# Patient Record
Sex: Female | Born: 1935
Health system: Southern US, Community
[De-identification: ages and names within clinical notes are randomized; demographics above are authoritative.]

## PROBLEM LIST (undated history)

## (undated) DIAGNOSIS — I4891 Unspecified atrial fibrillation: Secondary | ICD-10-CM

## (undated) DIAGNOSIS — M792 Neuralgia and neuritis, unspecified: Secondary | ICD-10-CM

## (undated) DIAGNOSIS — I509 Heart failure, unspecified: Secondary | ICD-10-CM

## (undated) DIAGNOSIS — J449 Chronic obstructive pulmonary disease, unspecified: Secondary | ICD-10-CM

## (undated) DIAGNOSIS — Z9289 Personal history of other medical treatment: Secondary | ICD-10-CM

## (undated) DIAGNOSIS — M1990 Unspecified osteoarthritis, unspecified site: Secondary | ICD-10-CM

## (undated) DIAGNOSIS — I050 Rheumatic mitral stenosis: Secondary | ICD-10-CM

## (undated) DIAGNOSIS — H9190 Unspecified hearing loss, unspecified ear: Secondary | ICD-10-CM

## (undated) DIAGNOSIS — Z9981 Dependence on supplemental oxygen: Secondary | ICD-10-CM

## (undated) DIAGNOSIS — Z8619 Personal history of other infectious and parasitic diseases: Secondary | ICD-10-CM

## (undated) DIAGNOSIS — J45909 Unspecified asthma, uncomplicated: Secondary | ICD-10-CM

## (undated) DIAGNOSIS — D649 Anemia, unspecified: Secondary | ICD-10-CM

## (undated) HISTORY — PX: TYMPANOSTOMY TUBE PLACEMENT: SHX32

## (undated) HISTORY — PX: KNEE ARTHROSCOPY: SHX127

## (undated) HISTORY — PX: CHOLECYSTECTOMY: SHX55

## (undated) HISTORY — PX: ABDOMINAL HYSTERECTOMY: SHX81

## (undated) HISTORY — PX: TUBAL LIGATION: SHX77

---

## 1998-02-09 ENCOUNTER — Other Ambulatory Visit: Admission: RE | Admit: 1998-02-09 | Discharge: 1998-02-09 | Payer: Self-pay | Admitting: Obstetrics and Gynecology

## 2003-10-19 LAB — PULMONARY FUNCTION TEST

## 2005-11-12 ENCOUNTER — Ambulatory Visit: Payer: Self-pay | Admitting: Cardiology

## 2008-09-14 ENCOUNTER — Ambulatory Visit: Payer: Self-pay | Admitting: Family Medicine

## 2008-09-14 DIAGNOSIS — B029 Zoster without complications: Secondary | ICD-10-CM | POA: Insufficient documentation

## 2008-09-14 DIAGNOSIS — J309 Allergic rhinitis, unspecified: Secondary | ICD-10-CM | POA: Insufficient documentation

## 2008-09-14 DIAGNOSIS — B0229 Other postherpetic nervous system involvement: Secondary | ICD-10-CM | POA: Insufficient documentation

## 2008-09-14 DIAGNOSIS — M129 Arthropathy, unspecified: Secondary | ICD-10-CM | POA: Insufficient documentation

## 2008-09-14 DIAGNOSIS — K219 Gastro-esophageal reflux disease without esophagitis: Secondary | ICD-10-CM | POA: Insufficient documentation

## 2008-10-09 ENCOUNTER — Telehealth (INDEPENDENT_AMBULATORY_CARE_PROVIDER_SITE_OTHER): Payer: Self-pay | Admitting: *Deleted

## 2008-10-23 ENCOUNTER — Telehealth (INDEPENDENT_AMBULATORY_CARE_PROVIDER_SITE_OTHER): Payer: Self-pay | Admitting: Family Medicine

## 2008-10-24 ENCOUNTER — Ambulatory Visit: Payer: Self-pay | Admitting: Family Medicine

## 2008-11-07 ENCOUNTER — Ambulatory Visit: Payer: Self-pay | Admitting: Family Medicine

## 2008-11-08 ENCOUNTER — Encounter (INDEPENDENT_AMBULATORY_CARE_PROVIDER_SITE_OTHER): Payer: Self-pay | Admitting: Family Medicine

## 2008-11-16 ENCOUNTER — Encounter (INDEPENDENT_AMBULATORY_CARE_PROVIDER_SITE_OTHER): Payer: Self-pay | Admitting: Family Medicine

## 2008-12-05 ENCOUNTER — Ambulatory Visit: Payer: Self-pay | Admitting: Family Medicine

## 2008-12-05 DIAGNOSIS — E663 Overweight: Secondary | ICD-10-CM | POA: Insufficient documentation

## 2009-03-06 ENCOUNTER — Ambulatory Visit: Payer: Self-pay | Admitting: Family Medicine

## 2009-03-06 DIAGNOSIS — G4700 Insomnia, unspecified: Secondary | ICD-10-CM | POA: Insufficient documentation

## 2009-04-03 ENCOUNTER — Encounter (INDEPENDENT_AMBULATORY_CARE_PROVIDER_SITE_OTHER): Payer: Self-pay | Admitting: Family Medicine

## 2009-04-12 ENCOUNTER — Encounter (INDEPENDENT_AMBULATORY_CARE_PROVIDER_SITE_OTHER): Payer: Self-pay | Admitting: Family Medicine

## 2011-03-21 LAB — PULMONARY FUNCTION TEST

## 2014-10-09 ENCOUNTER — Encounter (HOSPITAL_BASED_OUTPATIENT_CLINIC_OR_DEPARTMENT_OTHER): Payer: Self-pay | Admitting: *Deleted

## 2014-10-10 ENCOUNTER — Encounter (HOSPITAL_BASED_OUTPATIENT_CLINIC_OR_DEPARTMENT_OTHER): Payer: Self-pay | Admitting: *Deleted

## 2014-10-10 NOTE — Progress Notes (Signed)
Pt very hoh-says she has scar tissue ears, tubes, no hearing aids No htn -states asthma but no problems States she used to be on spiriva, but not in several yr

## 2014-10-17 ENCOUNTER — Ambulatory Visit (HOSPITAL_BASED_OUTPATIENT_CLINIC_OR_DEPARTMENT_OTHER): Payer: Medicare Other | Admitting: Certified Registered"

## 2014-10-17 ENCOUNTER — Encounter (HOSPITAL_BASED_OUTPATIENT_CLINIC_OR_DEPARTMENT_OTHER)
Admission: RE | Disposition: A | Discharge status: Home / Self Care | Payer: Self-pay | Source: Ambulatory Visit | Attending: Orthopedic Surgery

## 2014-10-17 ENCOUNTER — Encounter (HOSPITAL_BASED_OUTPATIENT_CLINIC_OR_DEPARTMENT_OTHER): Payer: Self-pay | Admitting: *Deleted

## 2014-10-17 ENCOUNTER — Ambulatory Visit (HOSPITAL_BASED_OUTPATIENT_CLINIC_OR_DEPARTMENT_OTHER)
Admission: RE | Admit: 2014-10-17 | Discharge: 2014-10-17 | Disposition: A | Discharge status: Home / Self Care | Payer: Medicare Other | Source: Ambulatory Visit | Attending: Orthopedic Surgery | Admitting: Orthopedic Surgery

## 2014-10-17 DIAGNOSIS — M720 Palmar fascial fibromatosis [Dupuytren]: Secondary | ICD-10-CM | POA: Diagnosis present

## 2014-10-17 DIAGNOSIS — K219 Gastro-esophageal reflux disease without esophagitis: Secondary | ICD-10-CM | POA: Insufficient documentation

## 2014-10-17 DIAGNOSIS — J45909 Unspecified asthma, uncomplicated: Secondary | ICD-10-CM | POA: Insufficient documentation

## 2014-10-17 DIAGNOSIS — Z79899 Other long term (current) drug therapy: Secondary | ICD-10-CM | POA: Insufficient documentation

## 2014-10-17 DIAGNOSIS — M1990 Unspecified osteoarthritis, unspecified site: Secondary | ICD-10-CM | POA: Diagnosis not present

## 2014-10-17 DIAGNOSIS — J449 Chronic obstructive pulmonary disease, unspecified: Secondary | ICD-10-CM | POA: Diagnosis not present

## 2014-10-17 DIAGNOSIS — Z87891 Personal history of nicotine dependence: Secondary | ICD-10-CM | POA: Insufficient documentation

## 2014-10-17 HISTORY — DX: Chronic obstructive pulmonary disease, unspecified: J44.9

## 2014-10-17 HISTORY — PX: FASCIECTOMY: SHX6525

## 2014-10-17 HISTORY — DX: Unspecified asthma, uncomplicated: J45.909

## 2014-10-17 HISTORY — DX: Unspecified osteoarthritis, unspecified site: M19.90

## 2014-10-17 HISTORY — DX: Neuralgia and neuritis, unspecified: M79.2

## 2014-10-17 HISTORY — DX: Personal history of other infectious and parasitic diseases: Z86.19

## 2014-10-17 HISTORY — DX: Unspecified hearing loss, unspecified ear: H91.90

## 2014-10-17 LAB — POCT HEMOGLOBIN-HEMACUE: Hemoglobin: 11 g/dL — ABNORMAL LOW (ref 12.0–15.0)

## 2014-10-17 SURGERY — FASCIECTOMY, PALM
Anesthesia: Regional | Site: Finger | Laterality: Right

## 2014-10-17 MED ORDER — PROPOFOL 10 MG/ML IV BOLUS
INTRAVENOUS | Status: DC | PRN
  Administered 2014-10-17: 100 mg via INTRAVENOUS

## 2014-10-17 MED ORDER — ONDANSETRON HCL 4 MG/2ML IJ SOLN
INTRAMUSCULAR | Status: DC | PRN
  Administered 2014-10-17: 4 mg via INTRAVENOUS

## 2014-10-17 MED ORDER — OXYCODONE HCL 5 MG PO TABS
5.0000 mg | ORAL_TABLET | Freq: Once | ORAL | Status: AC
  Administered 2014-10-17: 5 mg via ORAL

## 2014-10-17 MED ORDER — HYDROCODONE-ACETAMINOPHEN 5-325 MG PO TABS: 1.0000 | ORAL_TABLET | Freq: Four times a day (QID) | ORAL | Status: DC | PRN

## 2014-10-17 MED ORDER — BUPIVACAINE HCL (PF) 0.25 % IJ SOLN
INTRAMUSCULAR | Status: AC
  Filled 2014-10-17: qty 90

## 2014-10-17 MED ORDER — CEFAZOLIN SODIUM-DEXTROSE 2-3 GM-% IV SOLR
INTRAVENOUS | Status: AC
  Filled 2014-10-17: qty 50

## 2014-10-17 MED ORDER — FENTANYL CITRATE 0.05 MG/ML IJ SOLN
INTRAMUSCULAR | Status: DC | PRN
  Administered 2014-10-17: 25 ug via INTRAVENOUS
  Administered 2014-10-17: 50 ug via INTRAVENOUS
  Administered 2014-10-17 (×4): 25 ug via INTRAVENOUS

## 2014-10-17 MED ORDER — FENTANYL CITRATE 0.05 MG/ML IJ SOLN
INTRAMUSCULAR | Status: AC
  Filled 2014-10-17: qty 2

## 2014-10-17 MED ORDER — FENTANYL CITRATE 0.05 MG/ML IJ SOLN
25.0000 ug | INTRAMUSCULAR | Status: DC | PRN
  Administered 2014-10-17 (×2): 25 ug via INTRAVENOUS

## 2014-10-17 MED ORDER — LACTATED RINGERS IV SOLN
INTRAVENOUS | Status: DC
  Administered 2014-10-17 (×2): via INTRAVENOUS

## 2014-10-17 MED ORDER — MIDAZOLAM HCL 2 MG/2ML IJ SOLN: 1.0000 mg | INTRAMUSCULAR | Status: DC | PRN

## 2014-10-17 MED ORDER — LIDOCAINE HCL (CARDIAC) 20 MG/ML IV SOLN
INTRAVENOUS | Status: DC | PRN
  Administered 2014-10-17: 60 mg via INTRAVENOUS

## 2014-10-17 MED ORDER — VANCOMYCIN HCL IN DEXTROSE 1-5 GM/200ML-% IV SOLN
INTRAVENOUS | Status: AC
  Filled 2014-10-17: qty 200

## 2014-10-17 MED ORDER — BUPIVACAINE HCL (PF) 0.25 % IJ SOLN
INTRAMUSCULAR | Status: DC | PRN
  Administered 2014-10-17: 7 mL

## 2014-10-17 MED ORDER — OXYCODONE HCL 5 MG PO TABS
ORAL_TABLET | ORAL | Status: AC
  Filled 2014-10-17: qty 1

## 2014-10-17 MED ORDER — FENTANYL CITRATE 0.05 MG/ML IJ SOLN
INTRAMUSCULAR | Status: AC
  Filled 2014-10-17: qty 6

## 2014-10-17 MED ORDER — THROMBIN 20000 UNITS EX KIT
PACK | CUTANEOUS | Status: DC | PRN
  Administered 2014-10-17: 5000 [IU] via TOPICAL

## 2014-10-17 MED ORDER — VANCOMYCIN HCL IN DEXTROSE 1-5 GM/200ML-% IV SOLN
1000.0000 mg | INTRAVENOUS | Status: AC
  Administered 2014-10-17 (×2): 1000 mg via INTRAVENOUS

## 2014-10-17 MED ORDER — PROPOFOL 10 MG/ML IV EMUL
INTRAVENOUS | Status: AC
  Filled 2014-10-17: qty 50

## 2014-10-17 MED ORDER — DEXAMETHASONE SODIUM PHOSPHATE 10 MG/ML IJ SOLN
INTRAMUSCULAR | Status: DC | PRN
  Administered 2014-10-17: 8 mg via INTRAVENOUS

## 2014-10-17 MED ORDER — THROMBIN 5000 UNITS EX SOLR
CUTANEOUS | Status: AC
  Filled 2014-10-17: qty 5000

## 2014-10-17 MED ORDER — FENTANYL CITRATE 0.05 MG/ML IJ SOLN: 50.0000 ug | INTRAMUSCULAR | Status: DC | PRN

## 2014-10-17 MED ORDER — CHLORHEXIDINE GLUCONATE 4 % EX LIQD
60.0000 mL | Freq: Once | CUTANEOUS | Status: DC
Start: 2014-10-17 — End: 2014-10-17

## 2014-10-17 SURGICAL SUPPLY — 50 items
BLADE MINI RND TIP GREEN BEAV (BLADE) ×2 IMPLANT
BLADE SURG 15 STRL LF DISP TIS (BLADE) ×1 IMPLANT
BLADE SURG 15 STRL SS (BLADE) ×2
BNDG CMPR 9X4 STRL LF SNTH (GAUZE/BANDAGES/DRESSINGS) ×1
BNDG COHESIVE 3X5 TAN STRL LF (GAUZE/BANDAGES/DRESSINGS) ×2 IMPLANT
BNDG ESMARK 4X9 LF (GAUZE/BANDAGES/DRESSINGS) ×2 IMPLANT
BNDG GAUZE ELAST 4 BULKY (GAUZE/BANDAGES/DRESSINGS) ×2 IMPLANT
CHLORAPREP W/TINT 26ML (MISCELLANEOUS) ×2 IMPLANT
CORDS BIPOLAR (ELECTRODE) ×2 IMPLANT
COVER BACK TABLE 60X90IN (DRAPES) ×2 IMPLANT
COVER MAYO STAND STRL (DRAPES) ×2 IMPLANT
CUFF TOURNIQUET SINGLE 18IN (TOURNIQUET CUFF) ×2 IMPLANT
DECANTER SPIKE VIAL GLASS SM (MISCELLANEOUS) ×2 IMPLANT
DRAPE EXTREMITY T 121X128X90 (DRAPE) ×2 IMPLANT
DRAPE SURG 17X23 STRL (DRAPES) ×2 IMPLANT
DRSG KUZMA FLUFF (GAUZE/BANDAGES/DRESSINGS) IMPLANT
GAUZE SPONGE 4X4 12PLY STRL (GAUZE/BANDAGES/DRESSINGS) ×2 IMPLANT
GAUZE XEROFORM 1X8 LF (GAUZE/BANDAGES/DRESSINGS) ×2 IMPLANT
GLOVE BIO SURGEON STRL SZ7.5 (GLOVE) ×2 IMPLANT
GLOVE BIOGEL PI IND STRL 7.0 (GLOVE) ×1 IMPLANT
GLOVE BIOGEL PI IND STRL 8 (GLOVE) ×1 IMPLANT
GLOVE BIOGEL PI IND STRL 8.5 (GLOVE) ×1 IMPLANT
GLOVE BIOGEL PI INDICATOR 7.0 (GLOVE) ×1
GLOVE BIOGEL PI INDICATOR 8 (GLOVE) ×1
GLOVE BIOGEL PI INDICATOR 8.5 (GLOVE) ×1
GLOVE ECLIPSE 6.5 STRL STRAW (GLOVE) ×2 IMPLANT
GLOVE SURG ORTHO 8.0 STRL STRW (GLOVE) ×2 IMPLANT
GOWN STRL REUS W/ TWL LRG LVL3 (GOWN DISPOSABLE) ×1 IMPLANT
GOWN STRL REUS W/TWL LRG LVL3 (GOWN DISPOSABLE) ×2
GOWN STRL REUS W/TWL XL LVL3 (GOWN DISPOSABLE) ×4 IMPLANT
LOOP VESSEL MAXI BLUE (MISCELLANEOUS) ×2 IMPLANT
NS IRRIG 1000ML POUR BTL (IV SOLUTION) ×2 IMPLANT
PACK BASIN DAY SURGERY FS (CUSTOM PROCEDURE TRAY) ×2 IMPLANT
PAD CAST 3X4 CTTN HI CHSV (CAST SUPPLIES) ×1 IMPLANT
PADDING CAST ABS 3INX4YD NS (CAST SUPPLIES)
PADDING CAST ABS 4INX4YD NS (CAST SUPPLIES)
PADDING CAST ABS COTTON 3X4 (CAST SUPPLIES) IMPLANT
PADDING CAST ABS COTTON 4X4 ST (CAST SUPPLIES) IMPLANT
PADDING CAST COTTON 3X4 STRL (CAST SUPPLIES) ×2
SLEEVE SCD COMPRESS KNEE MED (MISCELLANEOUS) ×2 IMPLANT
SPLINT PLASTER CAST XFAST 3X15 (CAST SUPPLIES) ×10 IMPLANT
SPLINT PLASTER XTRA FASTSET 3X (CAST SUPPLIES) ×10
STOCKINETTE 4X48 STRL (DRAPES) ×2 IMPLANT
SUT ETHILON 4 0 PS 2 18 (SUTURE) ×4 IMPLANT
SUT SILK 2 0 FS (SUTURE) ×2 IMPLANT
SYR BULB 3OZ (MISCELLANEOUS) ×2 IMPLANT
SYR CONTROL 10ML LL (SYRINGE) ×2 IMPLANT
TOWEL OR 17X24 6PK STRL BLUE (TOWEL DISPOSABLE) ×2 IMPLANT
TOWEL OR NON WOVEN STRL DISP B (DISPOSABLE) ×2 IMPLANT
UNDERPAD 30X30 INCONTINENT (UNDERPADS AND DIAPERS) IMPLANT

## 2014-10-17 NOTE — Discharge Instructions (Signed)

## 2014-10-17 NOTE — Anesthesia Procedure Notes (Signed)
Procedure Name: LMA Insertion Date/Time: 10/17/2014 8:42 AM Performed by: Kristalyn Bergstresser D Pre-anesthesia Checklist: Patient identified, Emergency Drugs available, Suction available and Patient being monitored Patient Re-evaluated:Patient Re-evaluated prior to inductionOxygen Delivery Method: Circle System Utilized Preoxygenation: Pre-oxygenation with 100% oxygen Intubation Type: IV induction Ventilation: Mask ventilation without difficulty LMA: LMA inserted LMA Size: 4.0 Number of attempts: 1 Airway Equipment and Method: Bite block Placement Confirmation: positive ETCO2 Tube secured with: Tape Dental Injury: Teeth and Oropharynx as per pre-operative assessment

## 2014-10-17 NOTE — Op Note (Signed)
NAMEMarland Kitchen  KATORA, FADDIS NO.:  [id number]  MEDICAL RECORD NO.:  [id number]  LOCATION:                                 FACILITY:  PHYSICIAN:  Daryll Brod, M.D.            DATE OF BIRTH:  DATE OF PROCEDURE:  10/17/2014 DATE OF DISCHARGE:                              OPERATIVE REPORT   PREOPERATIVE DIAGNOSIS:  Dupuytren's contracture, right ring and small fingers.  POSTOPERATIVE DIAGNOSIS:  Dupuytren's contracture, right ring and small fingers.  OPERATION:  Fasciectomy, right ring and right small fingers.  SURGEON:  Daryll Brod, M.D.  ASSISTANT:  Leanora Cover, MD  ANESTHESIA:  General with wrist block.  ANESTHESIOLOGIST:  Ala Dach, M.D.  HISTORY:  The patient is an 81 year old female with a history of Dupuytren's contracture with significant deformity to the PIP joint of her right small finger.  She has elected to undergo fasciotomy, being aware of alternative treatments including fasciotomy, Zyvox injection, epineurotomy.  Pre, peri, and postoperative course have been discussed along with risks and complications.  She is aware that there is no guarantee with the surgery; possibility of infection; recurrence of injury to arteries, nerves, tendons; incomplete relief of symptoms and dystrophy.  In the preoperative area, the patient was seen, the extremity marked by both patient and surgeon, and antibiotic given.  DESCRIPTION OF PROCEDURE:  The patient was brought to the operating room where general anesthetic was carried out without difficulty under the direction of Dr. Orene Desanctis.  She was prepped using ChloraPrep, supine position with the right arm free.  A 3-minute dry-time was allowed. Time-out taken, confirming the patient and procedure.  The limb was exsanguinated with an Esmarch bandage.  Tourniquet placed high on the arm was inflated to 250 mmHg.  A volar Bruner incision was made, based on the small finger, carried down through the subcutaneous  tissue. Bleeders were electrocauterized with bipolar.  The cord was identified. Neurovascular structure was identified beneath this.  This was transected proximally, this allowed lifting of the cord distally.  The cord was followed to the ring finger and also to the small finger.  It terminated the small finger at the level of the A2 pulley.  This was excised, it terminated on the proximal phalanx of the ring finger.  A separate incision was made over the ring finger to allow elevation of the flap between the volar Bruner incision for the small finger and incision on the ring finger.  A cord was identified.  Neurovascular structure was protected throughout the procedure and the cord to the ring finger was excised.  This terminated the skin more proximally, the entire cord was removed and sent to Pathology.  Neurovascular structure was visualized, identified intact along its entire course.  The small finger was attended to next.  The incision taken out to the level of the middle phalanx.  An abductor digiti quinti cord, lateral digital sheet cord was present.  The neurovascular bundles on the ulnar side and radial side were both identified.  There was spiral deformity to the nerve on the ulnar aspect around the cord.  This was transected at the origin from the  abductor digiti quinti muscle, freed distally and excised from its insertion on the distal portion of the middle phalanx. This specimen was sent to Pathology.  Neurovascular bundle was intact along its entire course.  The wound was copiously irrigated with saline, sprayed with thrombin spray and closed over vessel loop drains with interrupted 4-0 nylon sutures.  A sterile compression portion of the dressing was applied.  Tourniquet deflated.  Ring and little finger were immediately pinked.  A dorsal splint applied.  Dressing completed and she was taken to the recovery room for observation in satisfactory condition.  She will be  discharged to home, to return to the [facility] in 1 week, on Norco.  Prior to placement of the dressing, a wrist block was given with 0.25% bupivacaine, 8 mL was used.  The patient tolerated the procedure well.  She will be discharged to home to return to the [facility] in 1 week.          ______________________________ Daryll Brod, M.D.     GK/MEDQ  D:  10/17/2014  T:  10/17/2014  Job:  [id number]

## 2014-10-17 NOTE — Transfer of Care (Signed)
Immediate Anesthesia Transfer of Care Note  Patient: Amber Hull  Procedure(s) Performed: Procedure(s): FASCIECTOMY RIGHT RING/SMALL FINGERS (Right)  Patient Location: PACU  Anesthesia Type:General  Level of Consciousness: awake, alert , oriented and patient cooperative  Airway & Oxygen Therapy: Patient Spontanous Breathing and Patient connected to face mask oxygen  Post-op Assessment: Report given to RN and Post -op Vital signs reviewed and stable  Post vital signs: Reviewed and stable  Last Vitals:  Filed Vitals:   10/17/14 0700  BP: 171/64  Pulse: 80  Temp: 36.6 C  Resp: 18    Complications: No apparent anesthesia complications

## 2014-10-17 NOTE — Op Note (Signed)
Dictation Number [id number]

## 2014-10-17 NOTE — Brief Op Note (Signed)
10/17/2014  9:41 AM  PATIENT:  Amber Hull  81 y.o. female  PRE-OPERATIVE DIAGNOSIS:  Dupuytren's Contracture Right Ring/Small Fingers  POST-OPERATIVE DIAGNOSIS:  Dupuytren's Contracture Right Ring/Small Fingers  PROCEDURE:  Procedure(s): FASCIECTOMY RIGHT RING/SMALL FINGERS (Right)  SURGEON:  Surgeon(s) and Role:    * Daryll Brod, MD - Primary    * Leanora Cover, MD - Assisting  PHYSICIAN ASSISTANT:   ASSISTANTS: K [place] Giammona,MD   ANESTHESIA:   local and general  EBL:  Total I/O In: 800 [I.V.:800] Out: -   BLOOD ADMINISTERED:none  DRAINS: none   LOCAL MEDICATIONS USED:  BUPIVICAINE   SPECIMEN:  Excision  DISPOSITION OF SPECIMEN:  PATHOLOGY  COUNTS:  YES  TOURNIQUET:   Total Tourniquet Time Documented: Upper Arm (Right) - 39 minutes Total: Upper Arm (Right) - 39 minutes   DICTATION: .Other Dictation: Dictation Number [id number]  PLAN OF CARE: Discharge to home after PACU  PATIENT DISPOSITION:  PACU - hemodynamically stable.

## 2014-10-17 NOTE — Anesthesia Preprocedure Evaluation (Addendum)
Anesthesia Evaluation  Patient identified by MRN, date of birth, ID band Patient awake    Reviewed: Allergy & Precautions, NPO status   Airway Mallampati: I       Dental   Pulmonary asthma , COPD COPD inhaler, former smoker,  breath sounds clear to auscultation        Cardiovascular Rhythm:Regular Rate:Normal     Neuro/Psych    GI/Hepatic GERD-  Medicated,  Endo/Other    Renal/GU      Musculoskeletal  (+) Arthritis -,   Abdominal   Peds  Hematology   Anesthesia Other Findings   Reproductive/Obstetrics                           Anesthesia Physical Anesthesia Plan  ASA: III  Anesthesia Plan: Bier Block   Post-op Pain Management:    Induction: Intravenous  Airway Management Planned: Natural Airway and Simple Face Mask  Additional Equipment:   Intra-op Plan:   Post-operative Plan: Extubation in OR  Informed Consent: I have reviewed the patients History and Physical, chart, labs and discussed the procedure including the risks, benefits and alternatives for the proposed anesthesia with the patient or authorized representative who has indicated his/her understanding and acceptance.   Dental advisory given  Plan Discussed with: CRNA and Surgeon  Anesthesia Plan Comments:        Anesthesia Quick Evaluation

## 2014-10-17 NOTE — H&P (Signed)
Amber Hull is an 81 year old right hand dominant female referred by Dr. Remo Lipps Case for a consultation with respect to flexion deformities to her right hand small finger especially and to a lesser extent the ring. This has been going on for at least 6 years. She says this began after a fall when she had her hand behind her. She thought she had a sprain and this has become progressively worse. She does not know where her ancestors are from. She is not complaining of any pain or discomfort. There is no history of diabetes, thyroid problems, arthritis or gout. She complains of occasional throbbing and feeling of numbness. She has a feeling of stiffness in the morning and she takes Tylenol for this.   PAST MEDICAL HISTORY:  She is allergic to PCN. She is on Gabapentin, Hydroxyzine. She has had a hysterectomy, ear surgery, and cyst removed from her left knee.   FAMILY MEDICAL HISTORY: Positive for diabetes, heart disease and arthritis.  SOCIAL HISTORY:  She does not smoke or use alcohol. She is widowed.  REVIEW OF SYSTEMS: Positive for glasses, hearing loss, shortness of breath, easy bruising, otherwise negative 14 points. Amber Hull is an 81 y.o. female.   Chief Complaint: Dupuytren's contracture right hand HPI: see above  Past Medical History  Diagnosis Date  . History of shingles   . Neuralgia   . Arthritis   . HOH (hard of hearing)   . COPD (chronic obstructive pulmonary disease)   . Asthma     Past Surgical History  Procedure Laterality Date  . Cholecystectomy    . Abdominal hysterectomy    . Tubal ligation    . Tympanostomy tube placement      both ears  . Knee arthroscopy      left    History reviewed. No pertinent family history. Social History:  reports that she quit smoking about 28 years ago. She does not have any smokeless tobacco history on file. She reports that she does not drink alcohol or use illicit drugs.  Allergies:  Allergies  Allergen Reactions   . Penicillins     REACTION: Bad rash    Medications Prior to Admission  Medication Sig Dispense Refill  . albuterol (PROVENTIL HFA;VENTOLIN HFA) 108 (90 BASE) MCG/ACT inhaler Inhale into the lungs every 6 (six) hours as needed for wheezing or shortness of breath.    Marland Kitchen albuterol (PROVENTIL) (2.5 MG/3ML) 0.083% nebulizer solution Take 2.5 mg by nebulization every 6 (six) hours as needed for wheezing or shortness of breath.    . calcium carbonate (OS-CAL) 600 MG TABS tablet Take 600 mg by mouth 2 (two) times daily with a meal.    . cholecalciferol (VITAMIN D) 1000 UNITS tablet Take 1,000 Units by mouth daily.    Marland Kitchen esomeprazole (NEXIUM) 20 MG capsule Take 20 mg by mouth daily at 12 noon.    . gabapentin (NEURONTIN) 400 MG capsule Take 400 mg by mouth at bedtime.    . hydrOXYzine (ATARAX/VISTARIL) 25 MG tablet Take 25 mg by mouth at bedtime.    . vitamin B-12 (CYANOCOBALAMIN) 1000 MCG tablet Take 1,000 mcg by mouth daily.    . vitamin C (ASCORBIC ACID) 500 MG tablet Take 500 mg by mouth daily.      No results found for this or any previous visit (from the past 48 hour(s)).  No results found.   Pertinent items are noted in HPI.  Blood pressure 171/64, pulse 80, temperature 97.9 F (36.6 C),  temperature source Oral, resp. rate 18, height 5\' 8"  (1.727 m), weight 90.436 kg (199 lb 6 oz), SpO2 98 %.  General appearance: alert, cooperative and appears stated age Head: Normocephalic, without obvious abnormality Neck: no JVD Resp: clear to auscultation bilaterally Cardio: regular rate and rhythm, S1, S2 normal, no murmur, click, rub or gallop GI: soft, non-tender; bowel sounds normal; no masses,  no organomegaly Extremities: cords and contracture right hand Pulses: 2+ and symmetric Skin: Skin color, texture, turgor normal. No rashes or lesions Neurologic: Grossly normal Incision/Wound: na  Assessment/Plan X-rays reveal minimal degenerative changes of the fingers.  DIAGNOSIS:  Dupuytren's contracture.  We had a long discussion with respect to the etiology of this with her. We would recommend fasciectomy of the ring and small fingers in that she wants the lumps removed and fasciotomy will not do this.I am reluctant to recommend Xiaflex injection to the abductor digiti quinti cord in that Xiaflex will not remove the lumps in her palm to the ring finger. Pre, peri and post op care are discussed along with risks and complications. She is advised of the potential for infection, injury to arteries, nerves, tendons, incomplete relief of symptoms and that she will not obtain full extension of the PIP joint of the small finger. [facility] as an outpatient under regional anesthesia.    Jahmal Dunavant R 10/17/2014, 7:38 AM

## 2014-10-18 ENCOUNTER — Encounter (HOSPITAL_BASED_OUTPATIENT_CLINIC_OR_DEPARTMENT_OTHER): Payer: Self-pay | Admitting: Orthopedic Surgery

## 2014-10-19 MED FILL — Thrombin For Soln 5000 Unit: CUTANEOUS | Qty: 5000 | Status: AC

## 2014-10-20 NOTE — Anesthesia Postprocedure Evaluation (Signed)
  Anesthesia Post-op Note  Patient: Amber Hull  Procedure(s) Performed: Procedure(s): FASCIECTOMY RIGHT RING/SMALL FINGERS (Right)  Patient Location: PACU  Anesthesia Type:General  Level of Consciousness: awake and alert   Airway and Oxygen Therapy: Patient Spontanous Breathing  Post-op Pain: mild  Post-op Assessment: Post-op Vital signs reviewed  Post-op Vital Signs: stable  Last Vitals:  Filed Vitals:   10/17/14 1123  BP: 162/82  Pulse: 81  Temp: 36.4 C  Resp: 16    Complications: No apparent anesthesia complications

## 2015-07-15 DIAGNOSIS — Z9289 Personal history of other medical treatment: Secondary | ICD-10-CM

## 2015-07-15 HISTORY — DX: Personal history of other medical treatment: Z92.89

## 2016-07-28 ENCOUNTER — Ambulatory Visit: Payer: Medicare Other | Admitting: Cardiovascular Disease

## 2017-04-01 ENCOUNTER — Ambulatory Visit (INDEPENDENT_AMBULATORY_CARE_PROVIDER_SITE_OTHER): Payer: Medicare HMO | Admitting: Cardiovascular Disease

## 2017-04-01 ENCOUNTER — Encounter: Payer: Self-pay | Admitting: Cardiovascular Disease

## 2017-04-01 ENCOUNTER — Encounter: Payer: Self-pay | Admitting: *Deleted

## 2017-04-01 VITALS — BP 90/50 | HR 80 | Ht 68.0 in | Wt 194.0 lb

## 2017-04-01 DIAGNOSIS — I4891 Unspecified atrial fibrillation: Secondary | ICD-10-CM

## 2017-04-01 DIAGNOSIS — D649 Anemia, unspecified: Secondary | ICD-10-CM | POA: Diagnosis not present

## 2017-04-01 DIAGNOSIS — I5033 Acute on chronic diastolic (congestive) heart failure: Secondary | ICD-10-CM

## 2017-04-01 DIAGNOSIS — K449 Diaphragmatic hernia without obstruction or gangrene: Secondary | ICD-10-CM | POA: Diagnosis not present

## 2017-04-01 DIAGNOSIS — R9439 Abnormal result of other cardiovascular function study: Secondary | ICD-10-CM

## 2017-04-01 DIAGNOSIS — I959 Hypotension, unspecified: Secondary | ICD-10-CM

## 2017-04-01 DIAGNOSIS — I050 Rheumatic mitral stenosis: Secondary | ICD-10-CM

## 2017-04-01 DIAGNOSIS — Z9289 Personal history of other medical treatment: Secondary | ICD-10-CM

## 2017-04-01 NOTE — Progress Notes (Signed)
CARDIOLOGY CONSULT NOTE  Patient ID: Amber Hull MRN: [id number] DOB/AGE: August 05, 1931 83 y.o.  Admit date: (Not on file) Primary Physician: Dr. Lewis Moccasin Referring Physician: Dr. Gar Ponto  Reason for Consultation: Diastolic heart failure and atrial fibrillation  HPI: Amber Hull is a 83 y.o. female who is being seen today for the evaluation of diastolic heart failure and atrial fibrillation at the request of Dr. Gar Ponto.  She is an 83 year old woman was recently hospitalized at [facility] for rapid atrial fibrillation and diastolic heart failure. I reviewed relevant documentation. BNP was elevated at 1100.   She was discharged yesterday. There is no discharge summary.  Echocardiogram reportedly demonstrated normal left ventricular systolic function, LVEF 86-57%, with mild to moderate mitral stenosis, mitral annular calcification, and mild left atrial enlargement.  She had some issues with anemia with hemoglobins varying between 9 and 11. She has a history of hiatal hernia and erosion in the antrum. She is on a PPI. She tells me she had an EGD in 2014.  She had no chest pain.  I personally reviewed all relevant labs, studies, and documentation.  Chest x-ray showed moderate CHF and pleural effusions.  She underwent a nuclear stress test in 2017 which demonstrated moderate lateral apical ischemia and was deemed an intermediate risk study.  Renal function was normal. Thyroid function was also normal. Troponins were normal.  No DVT in left lower extremity by venous Dopplers.  ECG which I personally interpreted demonstrated rapid atrial fibrillation, 120 bpm.  She is here with her daughter. The patient said she is dizzy and has episodic blurry vision. Blood pressure is 90/50. She is on long-acting diltiazem and lisinopril. She denies hematochezia and melena. She denies chest pain. She denies a history of myocardial infarction.  She said  she was hospitalized in August at [facility] for a pulmonary issue and was put on oxygen. I have no documentation regarding this hospitalization.  She is here with her daughter who lives 30 minutes away from the patient and she requires esophageal surgery.  The patient lives by herself.   Allergies  Allergen Reactions  . Penicillins     REACTION: Bad rash    Current Outpatient Prescriptions  Medication Sig Dispense Refill  . albuterol (PROVENTIL HFA;VENTOLIN HFA) 108 (90 BASE) MCG/ACT inhaler Inhale into the lungs every 6 (six) hours as needed for wheezing or shortness of breath.    Marland Kitchen albuterol (PROVENTIL) (2.5 MG/3ML) 0.083% nebulizer solution Take 2.5 mg by nebulization every 6 (six) hours as needed for wheezing or shortness of breath.    . budesonide-formoterol (SYMBICORT) 160-4.5 MCG/ACT inhaler Inhale 2 puffs into the lungs 2 (two) times daily.    . calcium carbonate (OS-CAL) 600 MG TABS tablet Take 600 mg by mouth 2 (two) times daily with a meal.    . cholecalciferol (VITAMIN D) 1000 UNITS tablet Take 1,000 Units by mouth daily.    Marland Kitchen diltiazem (CARDIZEM CD) 180 MG 24 hr capsule Take 180 mg by mouth daily.    . furosemide (LASIX) 40 MG tablet Take 40 mg by mouth.    . gabapentin (NEURONTIN) 400 MG capsule Take 400 mg by mouth at bedtime.    Marland Kitchen HYDROcodone-acetaminophen (NORCO) 5-325 MG per tablet Take 1 tablet by mouth every 6 (six) hours as needed for moderate pain. 30 tablet 0  . hydrOXYzine (ATARAX/VISTARIL) 25 MG tablet Take 25 mg by mouth at bedtime.    Marland Kitchen lisinopril (PRINIVIL,ZESTRIL) 5  MG tablet Take 2.5 mg by mouth 2 (two) times daily.    Marland Kitchen MAGNESIUM PO Take by mouth.    . montelukast (SINGULAIR) 10 MG tablet Take 10 mg by mouth at bedtime.    Marland Kitchen UNKNOWN TO PATIENT POTASSIUM - DOES NOT KNOW DOSE    . vitamin B-12 (CYANOCOBALAMIN) 1000 MCG tablet Take 1,000 mcg by mouth daily.    . vitamin C (ASCORBIC ACID) 500 MG tablet Take 500 mg by mouth daily.     No current  facility-administered medications for this visit.     Past Medical History:  Diagnosis Date  . Arthritis   . Asthma   . COPD (chronic obstructive pulmonary disease) ([place])   . History of shingles   . HOH (hard of hearing)   . Neuralgia     Past Surgical History:  Procedure Laterality Date  . ABDOMINAL HYSTERECTOMY    . CHOLECYSTECTOMY    . FASCIECTOMY Right 10/17/2014   Procedure: FASCIECTOMY RIGHT RING/SMALL FINGERS;  Surgeon: Daryll Brod, MD;  Location: [facility];  Service: Orthopedics;  Laterality: Right;  . KNEE ARTHROSCOPY     left  . TUBAL LIGATION    . TYMPANOSTOMY TUBE PLACEMENT     both ears    Social History   Social History  . Marital status: Widowed    Spouse name: N/A  . Number of children: N/A  . Years of education: N/A   Occupational History  . Not on file.   Social History Main Topics  . Smoking status: Former Smoker    Quit date: 10/10/1986  . Smokeless tobacco: Never Used  . Alcohol use No  . Drug use: No  . Sexual activity: Not on file   Other Topics Concern  . Not on file   Social History Narrative  . No narrative on file     No family history of premature CAD in 1st degree relatives.  Current Meds  Medication Sig  . albuterol (PROVENTIL HFA;VENTOLIN HFA) 108 (90 BASE) MCG/ACT inhaler Inhale into the lungs every 6 (six) hours as needed for wheezing or shortness of breath.  Marland Kitchen albuterol (PROVENTIL) (2.5 MG/3ML) 0.083% nebulizer solution Take 2.5 mg by nebulization every 6 (six) hours as needed for wheezing or shortness of breath.  . budesonide-formoterol (SYMBICORT) 160-4.5 MCG/ACT inhaler Inhale 2 puffs into the lungs 2 (two) times daily.  . calcium carbonate (OS-CAL) 600 MG TABS tablet Take 600 mg by mouth 2 (two) times daily with a meal.  . cholecalciferol (VITAMIN D) 1000 UNITS tablet Take 1,000 Units by mouth daily.  Marland Kitchen diltiazem (CARDIZEM CD) 180 MG 24 hr capsule Take 180 mg by mouth daily.  . furosemide (LASIX) 40 MG  tablet Take 40 mg by mouth.  . gabapentin (NEURONTIN) 400 MG capsule Take 400 mg by mouth at bedtime.  Marland Kitchen HYDROcodone-acetaminophen (NORCO) 5-325 MG per tablet Take 1 tablet by mouth every 6 (six) hours as needed for moderate pain.  . hydrOXYzine (ATARAX/VISTARIL) 25 MG tablet Take 25 mg by mouth at bedtime.  Marland Kitchen lisinopril (PRINIVIL,ZESTRIL) 5 MG tablet Take 2.5 mg by mouth 2 (two) times daily.  Marland Kitchen MAGNESIUM PO Take by mouth.  . montelukast (SINGULAIR) 10 MG tablet Take 10 mg by mouth at bedtime.  Marland Kitchen UNKNOWN TO PATIENT POTASSIUM - DOES NOT KNOW DOSE  . vitamin B-12 (CYANOCOBALAMIN) 1000 MCG tablet Take 1,000 mcg by mouth daily.  . vitamin C (ASCORBIC ACID) 500 MG tablet Take 500 mg by mouth daily.  . [  DISCONTINUED] esomeprazole (NEXIUM) 20 MG capsule Take 20 mg by mouth daily at 12 noon.      Review of systems complete and found to be negative unless listed above in HPI    Physical exam Blood pressure (!) 90/50, pulse 80, height 5\' 8"  (1.727 m), weight 194 lb (88 kg), SpO2 95 %. General: NAD Neck: No JVD, no thyromegaly or thyroid nodule.  Lungs: Diffuse faint crackles bilaterally. CV: Nondisplaced PMI. Regular rate and irregular rhythm, normal S1/S2, no S3, no murmur.  No peripheral edema.      Abdomen: Soft, nontender, no distention.  Skin: Intact without lesions or rashes.  Neurologic: Alert and oriented x 3.  Psych: Normal affect. Extremities: No clubbing or cyanosis.  HEENT: Normal.   ECG: Most recent ECG reviewed.   Labs: Lab Results  Component Value Date/Time   HGB 11.0 (L) 10/17/2014 08:20 AM     Lipids: No results found for: LDLCALC, LDLDIRECT, CHOL, TRIG, HDL      ASSESSMENT AND PLAN:  1. Atrial fibrillation: Currently rate controlled on long-acting diltiazem 180 mg daily. She cannot be safely anticoagulated until the etiology of her anemia is evaluated and treated.  2. Chronic diastolic heart failure: She has faint bilateral crackles. I will try and obtain  documentation and imaging regarding her hospitalization in August. For now, until your Lasix 40 mg daily.  3. Mild to moderate mitral stenosis: I will monitor this clinically and with surveillance echocardiography.  4. Anemia: She will need a GI workup before anticoagulation can be safely prescribed. I will make a GI referral for upper endoscopy and colonoscopy.  5. Hypotension: She is currently on long-acting diltiazem 180 mg daily and lisinopril 2.5 mg twice daily. I will discontinue lisinopril.  6. Abnormal stress test: Troponins were normal wall hospitalized. She denies chest pain. She may eventually need coronary angiography. I will monitor. Left ventricular systolic function is normal.   Disposition: Follow up in 1 month. I encouraged her to make an appointment with her PCP very soon.  Signed: Kate Sable, M.D., F.A.C.C.  04/01/2017, 10:54 AM

## 2017-04-01 NOTE — Patient Instructions (Addendum)
Medication Instructions:   Stop Lisinopril.    Continue all other medications.    Labwork:  CBC - order given today.  Office will contact with results via phone or letter.    Testing/Procedures: none  Follow-Up:  1 month   Please schedule follow up with primary MD.  Any Other Special Instructions Will Be Listed Below (If Applicable). You have been referred to:  [facility] - Dr. Oneida Alar.   If you need a refill on your cardiac medications before your next appointment, please call your pharmacy.

## 2017-04-07 ENCOUNTER — Encounter: Payer: Self-pay | Admitting: Gastroenterology

## 2017-04-08 ENCOUNTER — Telehealth: Payer: Self-pay | Admitting: Cardiovascular Disease

## 2017-04-08 NOTE — Telephone Encounter (Signed)
Returning call.

## 2017-04-09 NOTE — Telephone Encounter (Signed)
Notes recorded by Laurine Blazer, LPN on 07/22/3233 at 5:73 PM EDT Patient notified. Copy to Dr. Lorra Hals [facility]). Referral has already been done for Dr. Oneida Alar [facility]) & appointment scheduled for 05/14/2017. Phone number provided for Dr. Oneida Alar office as she would like to try & move that OV up a little sooner. There are NP & PA's there also that may be able to see her sooner.   Notes recorded by Herminio Commons, MD on 04/07/2017 at 12:36 PM EDT Anemic. Should see GI for further workup.

## 2017-04-14 ENCOUNTER — Telehealth: Payer: Self-pay | Admitting: Cardiovascular Disease

## 2017-04-14 NOTE — Telephone Encounter (Signed)
Patient does not understand why she has to see another specialist to have colonoscopy ordered.  She wants Dr Bronson Ing to order test so that she does not have to pay any more un necessary money.

## 2017-04-14 NOTE — Telephone Encounter (Signed)
Gave clarification of referral - pt anemic - to GI per LOV and per 9/25 phone note. Pt voiced understanding and says she will keep upcoming appt with Dr Oneida Alar.

## 2017-04-27 ENCOUNTER — Telehealth: Payer: Self-pay | Admitting: *Deleted

## 2017-04-27 NOTE — Telephone Encounter (Signed)
RS appt - LM on pt VM as requested.

## 2017-04-27 NOTE — Telephone Encounter (Signed)
-----   Message from Herminio Commons, MD sent at 04/27/2017 11:43 AM EDT ----- She can see me after GI appt. That way we'll have more info.  ----- Message ----- From: Massie Maroon, CMA Sent: 04/27/2017  10:35 AM To: Herminio Commons, MD  Pt is scheduled with Dr Oneida Alar 11/1 - does she still need to see you on 10/18 or RS for after appt with GI?  Staci

## 2017-04-30 ENCOUNTER — Ambulatory Visit: Payer: Medicare HMO | Admitting: Cardiovascular Disease

## 2017-05-14 ENCOUNTER — Ambulatory Visit (INDEPENDENT_AMBULATORY_CARE_PROVIDER_SITE_OTHER): Payer: Medicare HMO | Admitting: Gastroenterology

## 2017-05-14 ENCOUNTER — Emergency Department (HOSPITAL_COMMUNITY): Payer: Medicare HMO

## 2017-05-14 ENCOUNTER — Encounter: Payer: Self-pay | Admitting: Gastroenterology

## 2017-05-14 ENCOUNTER — Inpatient Hospital Stay (HOSPITAL_COMMUNITY)
Admission: EM | Admit: 2017-05-14 | Discharge: 2017-05-15 | DRG: 309 | Disposition: A | Discharge status: Home / Self Care | Payer: Medicare HMO | Attending: Family Medicine | Admitting: Family Medicine

## 2017-05-14 ENCOUNTER — Encounter (HOSPITAL_COMMUNITY): Payer: Self-pay | Admitting: Emergency Medicine

## 2017-05-14 ENCOUNTER — Ambulatory Visit: Payer: Medicare HMO | Admitting: Gastroenterology

## 2017-05-14 DIAGNOSIS — Z880 Allergy status to penicillin: Secondary | ICD-10-CM | POA: Diagnosis not present

## 2017-05-14 DIAGNOSIS — Z79899 Other long term (current) drug therapy: Secondary | ICD-10-CM | POA: Diagnosis not present

## 2017-05-14 DIAGNOSIS — Z87891 Personal history of nicotine dependence: Secondary | ICD-10-CM

## 2017-05-14 DIAGNOSIS — Z9981 Dependence on supplemental oxygen: Secondary | ICD-10-CM

## 2017-05-14 DIAGNOSIS — I050 Rheumatic mitral stenosis: Secondary | ICD-10-CM | POA: Diagnosis present

## 2017-05-14 DIAGNOSIS — J449 Chronic obstructive pulmonary disease, unspecified: Secondary | ICD-10-CM | POA: Diagnosis not present

## 2017-05-14 DIAGNOSIS — J9611 Chronic respiratory failure with hypoxia: Secondary | ICD-10-CM | POA: Diagnosis not present

## 2017-05-14 DIAGNOSIS — I482 Chronic atrial fibrillation: Principal | ICD-10-CM | POA: Diagnosis present

## 2017-05-14 DIAGNOSIS — R0602 Shortness of breath: Secondary | ICD-10-CM | POA: Diagnosis not present

## 2017-05-14 DIAGNOSIS — H9190 Unspecified hearing loss, unspecified ear: Secondary | ICD-10-CM | POA: Diagnosis present

## 2017-05-14 DIAGNOSIS — I509 Heart failure, unspecified: Secondary | ICD-10-CM | POA: Diagnosis not present

## 2017-05-14 DIAGNOSIS — I4891 Unspecified atrial fibrillation: Secondary | ICD-10-CM | POA: Diagnosis not present

## 2017-05-14 DIAGNOSIS — D649 Anemia, unspecified: Secondary | ICD-10-CM | POA: Diagnosis not present

## 2017-05-14 DIAGNOSIS — G4700 Insomnia, unspecified: Secondary | ICD-10-CM | POA: Diagnosis present

## 2017-05-14 DIAGNOSIS — K219 Gastro-esophageal reflux disease without esophagitis: Secondary | ICD-10-CM | POA: Diagnosis present

## 2017-05-14 HISTORY — DX: Rheumatic mitral stenosis: I05.0

## 2017-05-14 HISTORY — DX: Anemia, unspecified: D64.9

## 2017-05-14 HISTORY — DX: Dependence on supplemental oxygen: Z99.81

## 2017-05-14 HISTORY — DX: Personal history of other medical treatment: Z92.89

## 2017-05-14 HISTORY — DX: Heart failure, unspecified: I50.9

## 2017-05-14 HISTORY — DX: Unspecified atrial fibrillation: I48.91

## 2017-05-14 LAB — URINALYSIS, ROUTINE W REFLEX MICROSCOPIC
BILIRUBIN URINE: NEGATIVE
GLUCOSE, UA: NEGATIVE mg/dL
HGB URINE DIPSTICK: NEGATIVE
KETONES UR: NEGATIVE mg/dL
LEUKOCYTES UA: NEGATIVE
Nitrite: NEGATIVE
PH: 6 (ref 5.0–8.0)
PROTEIN: NEGATIVE mg/dL
Specific Gravity, Urine: 1.005 (ref 1.005–1.030)

## 2017-05-14 LAB — BASIC METABOLIC PANEL
Anion gap: 9 (ref 5–15)
BUN: 15 mg/dL (ref 6–20)
CALCIUM: 9.2 mg/dL (ref 8.9–10.3)
CHLORIDE: 104 mmol/L (ref 101–111)
CO2: 26 mmol/L (ref 22–32)
CREATININE: 0.96 mg/dL (ref 0.44–1.00)
GFR calc non Af Amer: 53 mL/min — ABNORMAL LOW (ref >60)
Glucose, Bld: 109 mg/dL — ABNORMAL HIGH (ref 65–99)
Potassium: 3.6 mmol/L (ref 3.5–5.1)
Sodium: 139 mmol/L (ref 135–145)

## 2017-05-14 LAB — CBC WITH DIFFERENTIAL/PLATELET
BASOS ABS: 0.1 10*3/uL (ref 0.0–0.1)
Basophils Relative: 1 %
EOS PCT: 5 %
Eosinophils Absolute: 0.5 10*3/uL (ref 0.0–0.7)
HEMATOCRIT: 31.8 % — AB (ref 36.0–46.0)
Hemoglobin: 9.9 g/dL — ABNORMAL LOW (ref 12.0–15.0)
LYMPHS ABS: 1.4 10*3/uL (ref 0.7–4.0)
LYMPHS PCT: 13 %
MCH: 26.5 pg (ref 26.0–34.0)
MCHC: 31.1 g/dL (ref 30.0–36.0)
MCV: 85.3 fL (ref 78.0–100.0)
MONO ABS: 0.8 10*3/uL (ref 0.1–1.0)
MONOS PCT: 7 %
NEUTROS ABS: 8.4 10*3/uL — AB (ref 1.7–7.7)
Neutrophils Relative %: 74 %
PLATELETS: 270 10*3/uL (ref 150–400)
RBC: 3.73 MIL/uL — ABNORMAL LOW (ref 3.87–5.11)
RDW: 15.8 % — AB (ref 11.5–15.5)
WBC: 11.2 10*3/uL — ABNORMAL HIGH (ref 4.0–10.5)

## 2017-05-14 LAB — BRAIN NATRIURETIC PEPTIDE: B Natriuretic Peptide: 447 pg/mL — ABNORMAL HIGH (ref 0.0–100.0)

## 2017-05-14 LAB — TROPONIN I

## 2017-05-14 MED ORDER — VITAMIN D 1000 UNITS PO TABS
1000.0000 [IU] | ORAL_TABLET | Freq: Every day | ORAL | Status: DC
  Administered 2017-05-14 – 2017-05-15 (×2): 1000 [IU] via ORAL
  Filled 2017-05-14 (×2): qty 1

## 2017-05-14 MED ORDER — HYDROCORTISONE 1 % EX CREA
1.0000 "application " | TOPICAL_CREAM | Freq: Three times a day (TID) | CUTANEOUS | Status: DC | PRN
  Filled 2017-05-14: qty 28

## 2017-05-14 MED ORDER — SODIUM CHLORIDE 0.9% FLUSH: 3.0000 mL | INTRAVENOUS | Status: DC | PRN

## 2017-05-14 MED ORDER — SODIUM CHLORIDE 0.9% FLUSH
3.0000 mL | Freq: Two times a day (BID) | INTRAVENOUS | Status: DC
  Administered 2017-05-14: 3 mL via INTRAVENOUS

## 2017-05-14 MED ORDER — ALUM & MAG HYDROXIDE-SIMETH 200-200-20 MG/5ML PO SUSP: 30.0000 mL | ORAL | Status: DC | PRN

## 2017-05-14 MED ORDER — CALCIUM CARBONATE 1250 (500 CA) MG PO TABS
1250.0000 mg | ORAL_TABLET | Freq: Two times a day (BID) | ORAL | Status: DC
  Administered 2017-05-15: 1250 mg via ORAL
  Filled 2017-05-14: qty 1

## 2017-05-14 MED ORDER — ASPIRIN EC 81 MG PO TBEC
81.0000 mg | DELAYED_RELEASE_TABLET | Freq: Every day | ORAL | Status: DC
  Administered 2017-05-14 – 2017-05-15 (×2): 81 mg via ORAL
  Filled 2017-05-14 (×2): qty 1

## 2017-05-14 MED ORDER — GUAIFENESIN-DM 100-10 MG/5ML PO SYRP: 5.0000 mL | ORAL_SOLUTION | ORAL | Status: DC | PRN

## 2017-05-14 MED ORDER — FUROSEMIDE 10 MG/ML IJ SOLN
40.0000 mg | Freq: Every day | INTRAMUSCULAR | Status: DC
  Filled 2017-05-14: qty 4

## 2017-05-14 MED ORDER — DILTIAZEM HCL ER COATED BEADS 180 MG PO CP24: 180.0000 mg | ORAL_CAPSULE | Freq: Every day | ORAL | Status: DC

## 2017-05-14 MED ORDER — DILTIAZEM LOAD VIA INFUSION
10.0000 mg | Freq: Once | INTRAVENOUS | Status: AC
  Administered 2017-05-14: 10 mg via INTRAVENOUS
  Filled 2017-05-14: qty 10

## 2017-05-14 MED ORDER — FUROSEMIDE 10 MG/ML IJ SOLN
40.0000 mg | Freq: Once | INTRAMUSCULAR | Status: AC
  Administered 2017-05-14: 40 mg via INTRAVENOUS
  Filled 2017-05-14: qty 4

## 2017-05-14 MED ORDER — VITAMIN B-12 1000 MCG PO TABS
1000.0000 ug | ORAL_TABLET | Freq: Every day | ORAL | Status: DC
  Administered 2017-05-14 – 2017-05-15 (×2): 1000 ug via ORAL
  Filled 2017-05-14 (×2): qty 1

## 2017-05-14 MED ORDER — MUSCLE RUB 10-15 % EX CREA
TOPICAL_CREAM | CUTANEOUS | Status: AC
  Filled 2017-05-14: qty 85

## 2017-05-14 MED ORDER — HYDROXYZINE HCL 25 MG PO TABS
25.0000 mg | ORAL_TABLET | Freq: Every day | ORAL | Status: DC
  Administered 2017-05-14: 25 mg via ORAL
  Filled 2017-05-14: qty 1

## 2017-05-14 MED ORDER — BLISTEX MEDICATED EX OINT
1.0000 "application " | TOPICAL_OINTMENT | CUTANEOUS | Status: DC | PRN
  Filled 2017-05-14: qty 7

## 2017-05-14 MED ORDER — ENOXAPARIN SODIUM 40 MG/0.4ML ~~LOC~~ SOLN
40.0000 mg | SUBCUTANEOUS | Status: DC
  Administered 2017-05-14: 40 mg via SUBCUTANEOUS
  Filled 2017-05-14: qty 0.4

## 2017-05-14 MED ORDER — ONDANSETRON HCL 4 MG/2ML IJ SOLN: 4.0000 mg | Freq: Four times a day (QID) | INTRAMUSCULAR | Status: DC | PRN

## 2017-05-14 MED ORDER — ACETAMINOPHEN 325 MG PO TABS: 650.0000 mg | ORAL_TABLET | ORAL | Status: DC | PRN

## 2017-05-14 MED ORDER — MUSCLE RUB 10-15 % EX CREA
1.0000 "application " | TOPICAL_CREAM | CUTANEOUS | Status: DC | PRN
  Administered 2017-05-15: 1 via TOPICAL
  Filled 2017-05-14: qty 85

## 2017-05-14 MED ORDER — ORAL CARE MOUTH RINSE
15.0000 mL | Freq: Two times a day (BID) | OROMUCOSAL | Status: DC
  Administered 2017-05-14 – 2017-05-15 (×2): 15 mL via OROMUCOSAL

## 2017-05-14 MED ORDER — MONTELUKAST SODIUM 10 MG PO TABS
10.0000 mg | ORAL_TABLET | Freq: Every day | ORAL | Status: DC
  Administered 2017-05-14: 10 mg via ORAL
  Filled 2017-05-14: qty 1

## 2017-05-14 MED ORDER — DILTIAZEM HCL-DEXTROSE 100-5 MG/100ML-% IV SOLN (PREMIX)
5.0000 mg/h | INTRAVENOUS | Status: DC
  Administered 2017-05-14: 5 mg/h via INTRAVENOUS
  Filled 2017-05-14 (×2): qty 100

## 2017-05-14 MED ORDER — VITAMIN C 500 MG PO TABS
500.0000 mg | ORAL_TABLET | Freq: Every day | ORAL | Status: DC
  Administered 2017-05-14 – 2017-05-15 (×2): 500 mg via ORAL
  Filled 2017-05-14 (×2): qty 1

## 2017-05-14 MED ORDER — ALBUTEROL SULFATE (2.5 MG/3ML) 0.083% IN NEBU
5.0000 mg | INHALATION_SOLUTION | Freq: Once | RESPIRATORY_TRACT | Status: AC
  Administered 2017-05-14: 5 mg via RESPIRATORY_TRACT
  Filled 2017-05-14: qty 6

## 2017-05-14 MED ORDER — SODIUM CHLORIDE 0.9 % IV SOLN: 250.0000 mL | INTRAVENOUS | Status: DC | PRN

## 2017-05-14 MED ORDER — DILTIAZEM HCL 60 MG PO TABS
60.0000 mg | ORAL_TABLET | Freq: Once | ORAL | Status: AC
  Administered 2017-05-14: 60 mg via ORAL
  Filled 2017-05-14: qty 1

## 2017-05-14 MED ORDER — DILTIAZEM HCL ER COATED BEADS 240 MG PO CP24
240.0000 mg | ORAL_CAPSULE | Freq: Every day | ORAL | Status: DC
  Administered 2017-05-15: 240 mg via ORAL
  Filled 2017-05-14: qty 1

## 2017-05-14 MED ORDER — GABAPENTIN 400 MG PO CAPS
400.0000 mg | ORAL_CAPSULE | Freq: Every day | ORAL | Status: DC
  Administered 2017-05-14: 400 mg via ORAL
  Filled 2017-05-14: qty 1

## 2017-05-14 NOTE — ED Notes (Signed)
Report to Thayer Headings, RN 300

## 2017-05-14 NOTE — Progress Notes (Signed)
Subjective:    Patient ID: Amber Hull, female    DOB: [birth year], 83 y.o.   MRN: [id number]  System, Pcp Not In PCP: Leveda Anna, MD CARDIOLOGIST: DR. Bronson Ing PULMONOLOGIST: DR. Koleen Nimrod  HPI HAD PANIC ATTACK AND THINKS SHE HAS FLUID RETENTION. ON 3L O2 SINCE SEP 2018. SEE PULMONOLOGY: DR. Koleen Nimrod. NEVER BLOOD TRANSFUSION. HAD TCS > 10 YEARS. SEVERE SOB IN [facility] AND AT HOME(WORSE THAN YESTERDAY). BMs: 2X/WEEK UNTIL IT GETS LOOSE AND HAS NO CONTROL. DRINKS SWEET MILK BUT SPITS IT BACK UP. LOVES BUTTERMILK: RARE. CHEESE: NO. ICE CREAM: RARE. NO REAL TROUBLE WITH CONSTIPATION. [place].  PT DENIES FEVER, CHILLS, HEMATOCHEZIA, HEMATEMESIS, nausea, vomiting, melena, CHEST PAIN, CHANGE IN BOWEL IN HABITS, constipation, abdominal pain, problems swallowing, problems with sedation, OR heartburn or indigestion.  Past Medical History:  Diagnosis Date  . Arthritis   . Asthma   . COPD (chronic obstructive pulmonary disease) ([place])   . History of shingles   . HOH (hard of hearing)   . Neuralgia     Past Surgical History:  Procedure Laterality Date  . ABDOMINAL HYSTERECTOMY    . CHOLECYSTECTOMY    . FASCIECTOMY Right 10/17/2014     . KNEE ARTHROSCOPY     left  . TUBAL LIGATION    . TYMPANOSTOMY TUBE PLACEMENT     both ears    Allergies  Allergen Reactions  . Penicillins     REACTION: Bad rash    Current Outpatient Prescriptions  Medication Sig Dispense Refill  . albuterol (PROVENTIL HFA;VENTOLIN HFA) 108 (90 BASE) MCG/ACT inhaler Inhale into the lungs every 6 (six) hours as needed for wheezing or shortness of breath.    Marland Kitchen albuterol (PROVENTIL) (2.5 MG/3ML) 0.083% nebulizer solution Take 2.5 mg by nebulization every 6 (six) hours as needed for wheezing or shortness of breath.    . SYMBICORT160-4.5 MCG/ACT inhaler Inhale 2 puffs into the lungs 2 (two) times daily.    . calcium carbonate 600 MG TABS tablet Take 600 mg by mouth 2 (two) times daily with  a meal.    . cholecalciferol 1000 UNITS tablet Take 1,000 Units by mouth daily.    Marland Kitchen diltiazem 180 MG 24 hr capsule Take 180 mg by mouth daily.    Marland Kitchen esomeprazole (NEXIUM) 20 MG capsule Take 20 mg by mouth daily at 12 noon.    . furosemide (LASIX) 40 MG tablet Take 40 mg by mouth.    . gabapentin (NEURONTIN) 400 MG capsule Take 400 mg by mouth at bedtime.    . hydrOXYzine 25 MG tablet Take 25 mg by mouth at bedtime.    Marland Kitchen MAGNESIUM PO Take by mouth. Twice a week    . montelukast (SINGULAIR) 10 MG tablet Take 10 mg by mouth at bedtime.    Marland Kitchen UNKNOWN TO PATIENT POTASSIUM - DOES NOT KNOW DOSE . Take every other day    . vitamin B-12 (CYANOCOBALAMIN) 1000 MCG tablet Take 1,000 mcg by mouth daily.    . vitamin C (ASCORBIC ACID) 500 MG tablet Take 500 mg by mouth daily.     Family History  Problem Relation Age of Onset  . Kidney cancer Mother   . Colon polyps Sister   . Colon cancer Neg Hx   LOST 7 BROTHERS. 3 GIRLS STILL LIVING. SHE IS THE YOUNGEST ONE.  Social History  Substance Use Topics  . Smoking status: Former Smoker    Quit date: 10/10/1986  . Smokeless tobacco: Never Used  .  Alcohol use No   Review of Systems PER HPI OTHERWISE ALL SYSTEMS ARE NEGATIVE.    Objective:   Physical Exam  Constitutional: She is oriented to person, place, and time. She appears well-developed and well-nourished. No distress.  HENT:  Head: Normocephalic and atraumatic.  Mouth/Throat: Oropharynx is clear and moist. No oropharyngeal exudate.  Eyes: Pupils are equal, round, and reactive to light. No scleral icterus.  Neck: Normal range of motion. Neck supple.  Cardiovascular: Normal heart sounds.   No murmur heard. Rapid rate, irregular rhythm  Pulmonary/Chest: Breath sounds normal. Respiratory distress: MILD WITH AUTOPEEP, USING ACCESSORY MUSCLES TO BREATHE, ABLE TO COMPLETE FULL SENTENCES.  Abdominal: Soft. Bowel sounds are normal. She exhibits no distension. There is no tenderness.  Musculoskeletal:  She exhibits no edema.  Lymphadenopathy:    She has no cervical adenopathy.  Neurological: She is alert and oriented to person, place, and time.  NO  NEW FOCAL DEFICITS, HARD OF HEARING  Psychiatric: She has a normal mood and affect.  Vitals reviewed. HEART RATE: 143 PER EXAM BY MD.    Assessment & Plan:

## 2017-05-14 NOTE — ED Notes (Signed)
Pt has right AC IV in place. This IV is positional. I attempted to put a second IV in the patient but she refused and stated, "I've been stuck enough in the poor ole arm."

## 2017-05-14 NOTE — Assessment & Plan Note (Addendum)
MOST LIKELY DUE TO CHRONIC RENAL INSUFFICIENCY AND POSSIBLE OCCULT GI BLOOD LOSS(GASTRITIS/POLYPS).  1145: SPOKE TO DR. DR. Cathleen Fears AND Bronson Ing. PT BEING SENT TO ED FOR EVALUATION. NEEDS CBC/B12/RBC FOLATE. CONSIDER INPATIENT EGD/TCS PRIOR TO DISCHARGE. FOLLOW UP IN 6 WEEKS.

## 2017-05-14 NOTE — ED Notes (Signed)
Pt placed on the bedpan

## 2017-05-14 NOTE — Patient Instructions (Addendum)
GO TO ED TO HAVE YOUR HEART CHECKED.  FOLLOW UP IN 6 WEEKS.

## 2017-05-14 NOTE — Progress Notes (Signed)
PATIENT SCHEDULED  °

## 2017-05-14 NOTE — ED Notes (Signed)
ED Provider at bedside. 

## 2017-05-14 NOTE — H&P (Signed)
History and Physical    Amber Hull FTD:[id number] DOB: 1932/03/16 DOA: 05/14/2017  PCP: System, Pcp Not In  Patient coming from:  home  Chief Complaint:   Sob, fast HR  HPI: Amber Hull is a 83 y.o. female with medical history significant of chf, copd, crf on 3 liters at home, copd, was at dr fields office today for work up and evaluation of her anemia.  Pt was just hosptialized at [facility] and sent to follow up with GI.  She reports she saw dr [name] 9/19 (note in epic) and a pill was stopped, (lisiniopril).  Deemed not appropriate for anticoagulation until anemia worked up.  She denies any melena or brbpr.  No abd pain.  No fevers.  No swelling.  Sob is same as chronically.  Pt sent here from dr fields office for heartrate of 140s.  Found to be in afib with rvr.   Review of Systems: As per HPI otherwise 10 point review of systems negative.   Past Medical History:  Diagnosis Date  . Anemia   . Arthritis   . Asthma   . Atrial fibrillation ([place])   . CHF (congestive heart failure) ([place])   . COPD (chronic obstructive pulmonary disease) ([place])   . History of nuclear stress test 2017   intermediate risk  . History of shingles   . HOH (hard of hearing)   . Mitral stenosis   . Neuralgia   . On home O2    3L N/C    Past Surgical History:  Procedure Laterality Date  . ABDOMINAL HYSTERECTOMY    . CHOLECYSTECTOMY    . FASCIECTOMY Right 10/17/2014   Procedure: FASCIECTOMY RIGHT RING/SMALL FINGERS;  Surgeon: Daryll Brod, MD;  Location: [facility];  Service: Orthopedics;  Laterality: Right;  . KNEE ARTHROSCOPY     left  . TUBAL LIGATION    . TYMPANOSTOMY TUBE PLACEMENT     both ears     reports that she quit smoking about 30 years ago. She has never used smokeless tobacco. She reports that she does not drink alcohol or use drugs.  Allergies  Allergen Reactions  . Penicillins Rash    Has patient had a PCN reaction causing immediate rash,  facial/tongue/throat swelling, SOB or lightheadedness with hypotension: No Has patient had a PCN reaction causing severe rash involving mucus membranes or skin necrosis: No Has patient had a PCN reaction that required hospitalization: No Has patient had a PCN reaction occurring within the last 10 years: No If all of the above answers are "NO", then may proceed with Cephalosporin use.     Family History  Problem Relation Age of Onset  . Kidney cancer Mother   . Colon polyps Sister   . Colon cancer Neg Hx     Prior to Admission medications   Medication Sig Start Date End Date Taking? Authorizing Provider  albuterol (PROVENTIL HFA;VENTOLIN HFA) 108 (90 BASE) MCG/ACT inhaler Inhale into the lungs every 6 (six) hours as needed for wheezing or shortness of breath.   Yes [provider]  albuterol (PROVENTIL) (2.5 MG/3ML) 0.083% nebulizer solution Take 2.5 mg by nebulization every 6 (six) hours as needed for wheezing or shortness of breath.   Yes [provider]  budesonide-formoterol (SYMBICORT) 160-4.5 MCG/ACT inhaler Inhale 2 puffs into the lungs 2 (two) times daily.   Yes [provider]  calcium carbonate (OS-CAL) 600 MG TABS tablet Take 600 mg by mouth 2 (two) times daily with a  meal.   Yes [provider]  cholecalciferol (VITAMIN D) 1000 UNITS tablet Take 1,000 Units by mouth daily.   Yes [provider]  diltiazem (CARDIZEM CD) 180 MG 24 hr capsule Take 180 mg by mouth daily.   Yes [provider]  esomeprazole (NEXIUM) 20 MG capsule Take 20 mg by mouth daily at 12 noon.   Yes [provider]  furosemide (LASIX) 40 MG tablet Take 40 mg by mouth.   Yes [provider]  gabapentin (NEURONTIN) 400 MG capsule Take 400 mg by mouth at bedtime.   Yes [provider]  hydrOXYzine (ATARAX/VISTARIL) 25 MG tablet Take 25 mg by mouth at bedtime.   Yes [provider]  MAGNESIUM PO Take by mouth. Twice a week    Yes [provider]  montelukast (SINGULAIR) 10 MG tablet Take 10 mg by mouth at bedtime.   Yes [provider]  vitamin B-12 (CYANOCOBALAMIN) 1000 MCG tablet Take 1,000 mcg by mouth daily.   Yes [provider]  vitamin C (ASCORBIC ACID) 500 MG tablet Take 500 mg by mouth daily.   Yes [provider]    Physical Exam: Vitals:   05/14/17 1400 05/14/17 1445 05/14/17 1500 05/14/17 1600  BP: 140/89  (!) 115/103 130/85  Pulse: (!) 115  (!) 105 (!) 102  Resp: 20  (!) 26 17  Temp:      TempSrc:      SpO2: 100% 100% 96% 99%  Weight:      Height:         Constitutional: NAD, calm, comfortable Vitals:   05/14/17 1400 05/14/17 1445 05/14/17 1500 05/14/17 1600  BP: 140/89  (!) 115/103 130/85  Pulse: (!) 115  (!) 105 (!) 102  Resp: 20  (!) 26 17  Temp:      TempSrc:      SpO2: 100% 100% 96% 99%  Weight:      Height:       Eyes: PERRL, lids and conjunctivae normal ENMT: Mucous membranes are moist. Posterior pharynx clear of any exudate or lesions.Normal dentition.  Neck: normal, supple, no masses, no thyromegaly Respiratory: clear to auscultation bilaterally, no wheezing, no crackles. Normal respiratory effort. No accessory muscle use.  Cardiovascular: irRegular rate and rhythm, no murmurs / rubs / gallops. No extremity edema. 2+ pedal pulses. No carotid bruits.  Abdomen: no tenderness, no masses palpated. No hepatosplenomegaly. Bowel sounds positive.  Musculoskeletal: no clubbing / cyanosis. No joint deformity upper and lower extremities. Good ROM, no contractures. Normal muscle tone.  Skin: no rashes, lesions, ulcers. No induration Neurologic: CN 2-12 grossly intact. Sensation intact, DTR normal. Strength 5/5 in all 4.  Psychiatric: Normal judgment and insight. Alert and oriented x 3. Normal mood.    Labs on Admission: I have personally reviewed following labs and imaging studies  CBC:  Recent Labs Lab 05/14/17 1442  WBC 11.2*  NEUTROABS  8.4*  HGB 9.9*  HCT 31.8*  MCV 85.3  PLT 376   Basic Metabolic Panel:  Recent Labs Lab 05/14/17 1442  NA 139  K 3.6  CL 104  CO2 26  GLUCOSE 109*  BUN 15  CREATININE 0.96  CALCIUM 9.2   GFR: Estimated Creatinine Clearance: 52.4 mL/min (by C-G formula based on SCr of 0.96 mg/dL).  Cardiac Enzymes:  Recent Labs Lab 05/14/17 1442  TROPONINI <0.03   Urine analysis:    Component Value Date/Time   COLORURINE YELLOW 05/14/2017 [place] 05/14/2017 1342  LABSPEC 1.005 05/14/2017 1342   PHURINE 6.0 05/14/2017 1342   GLUCOSEU NEGATIVE 05/14/2017 1342   HGBUR NEGATIVE 05/14/2017 1342   BILIRUBINUR NEGATIVE 05/14/2017 1342   KETONESUR NEGATIVE 05/14/2017 1342   PROTEINUR NEGATIVE 05/14/2017 1342   NITRITE NEGATIVE 05/14/2017 1342   LEUKOCYTESUR NEGATIVE 05/14/2017 1342    Radiological Exams on Admission: Dg Chest 2 View  Result Date: 05/14/2017 CLINICAL DATA:  Shortness of breath.  Asthma and COPD. EXAM: CHEST  2 VIEW COMPARISON:  None. FINDINGS: Heart is enlarged. A diffuse interstitial pattern likely represents edema. Bilateral effusions are present. Bibasilar airspace disease likely reflects atelectasis. Infection is considered less likely. There is elevation of the right hemidiaphragm anteriorly. Linear airspace disease is present in the right upper lobe. Degenerative changes are noted in the thoracic spine and both shoulders. The visualized soft tissues and bony thorax are otherwise unremarkable. IMPRESSION: 1. Congestive heart failure. 2. Linear airspace opacity right upper lobe and airspace opacities at both lung bases likely reflect atelectasis. Infection is considered less likely. Electronically Signed   By: San Morelle M.D.   On: 05/14/2017 13:35    EKG: Independently reviewed.  afib with rvr Old chart reviewed Case discussed with edp dr mcmannus  Assessment/Plan 83 yo female with chronic afib now with RVR  Principal Problem:    Atrial fibrillation with RVR ([place])- give extra cardizem 60 mg po now, wean dilt drip to keep rate below 100.  Increase long acting cardizem to 240mg  from 180mg  in the am.    Active Problems:   Normocytic anemia- cont gi work up as outpt, no active bleeding   CHF (congestive heart failure) ([place])- had recent echo at [facility], will not repeat (per cards note)   COPD (chronic obstructive pulmonary disease) ([place])- stable, cont 3 liters   Chronic respiratory failure with hypoxia ([place])- stable on 3 liters o2   SOB (shortness of breath)- likely due to rate uncontrolled     DVT prophylaxis:  scds  Code Status: full Family Communication:  none Disposition Plan:  Per day team Consults called:  none Admission status:   admit   Roseanne Juenger A MD [facility]  If 7PM-7AM, please contact night-coverage [email or web address] Password [facility]  05/14/2017, 4:24 PM

## 2017-05-14 NOTE — ED Triage Notes (Signed)
Pt is SOB on arrival to ED. States she woke up like this. Chronically wears O2 3L. Is breathing 34 times per minute.

## 2017-05-14 NOTE — Plan of Care (Signed)
Problem: Education: Goal: Knowledge of [place] [facility] Education information/materials will improve Outcome: Progressing Pt given education guide, oriented to room, unit routines and bed alarms . Pt able to demonstrate correct use of call bell.

## 2017-05-14 NOTE — ED Provider Notes (Signed)
[facility] EMERGENCY DEPARTMENT Provider Note   CSN: [id number] Arrival date & time: 05/14/17  1205     History   Chief Complaint Chief Complaint  Patient presents with  . Shortness of Breath    HPI Amber Hull is a 83 y.o. female.   Shortness of Breath   Pt was seen at 1340. Per pt, c/o gradual onset and worsening of persistent SOB for the past several days, worse today. Has been associated with "fast HR." Pt was evaluated by her GI MD today for anemia, but was sent to the ED for further evaluation for SOB and tachycardia. Denies CP, no cough, no back pain, no abd pain, no N/V/D, no fevers, no rash.   Past Medical History:  Diagnosis Date  . Anemia   . Arthritis   . Asthma   . Atrial fibrillation ([place])   . CHF (congestive heart failure) ([place])   . COPD (chronic obstructive pulmonary disease) ([place])   . History of nuclear stress test 2017   intermediate risk  . History of shingles   . HOH (hard of hearing)   . Mitral stenosis   . Neuralgia   . On home O2    3L N/C    Patient Active Problem List   Diagnosis Date Noted  . Normocytic anemia 05/14/2017  . INSOMNIA 03/06/2009  . OVERWEIGHT 12/05/2008  . POSTHERPETIC NEURALGIA 09/14/2008  . SHINGLES 09/14/2008  . ALLERGIC RHINITIS 09/14/2008  . GERD 09/14/2008  . ARTHRITIS 09/14/2008    Past Surgical History:  Procedure Laterality Date  . ABDOMINAL HYSTERECTOMY    . CHOLECYSTECTOMY    . FASCIECTOMY Right 10/17/2014   Procedure: FASCIECTOMY RIGHT RING/SMALL FINGERS;  Surgeon: Daryll Brod, MD;  Location: [place];  Service: Orthopedics;  Laterality: Right;  . KNEE ARTHROSCOPY     left  . TUBAL LIGATION    . TYMPANOSTOMY TUBE PLACEMENT     both ears    OB History    No data available       Home Medications    Prior to Admission medications   Medication Sig Start Date End Date Taking? Authorizing Provider  albuterol (PROVENTIL HFA;VENTOLIN HFA) 108 (90 BASE) MCG/ACT inhaler  Inhale into the lungs every 6 (six) hours as needed for wheezing or shortness of breath.    [provider]  albuterol (PROVENTIL) (2.5 MG/3ML) 0.083% nebulizer solution Take 2.5 mg by nebulization every 6 (six) hours as needed for wheezing or shortness of breath.    [provider]  budesonide-formoterol (SYMBICORT) 160-4.5 MCG/ACT inhaler Inhale 2 puffs into the lungs 2 (two) times daily.    [provider]  calcium carbonate (OS-CAL) 600 MG TABS tablet Take 600 mg by mouth 2 (two) times daily with a meal.    [provider]  cholecalciferol (VITAMIN D) 1000 UNITS tablet Take 1,000 Units by mouth daily.    [provider]  diltiazem (CARDIZEM CD) 180 MG 24 hr capsule Take 180 mg by mouth daily.    [provider]  esomeprazole (NEXIUM) 20 MG capsule Take 20 mg by mouth daily at 12 noon.    [provider]  furosemide (LASIX) 40 MG tablet Take 40 mg by mouth.    [provider]  gabapentin (NEURONTIN) 400 MG capsule Take 400 mg by mouth at bedtime.    [provider]  hydrOXYzine (ATARAX/VISTARIL) 25 MG tablet Take 25 mg by mouth at bedtime.    [provider]  MAGNESIUM  PO Take by mouth. Twice a week    [provider]  montelukast (SINGULAIR) 10 MG tablet Take 10 mg by mouth at bedtime.    [provider]  UNKNOWN TO PATIENT POTASSIUM - DOES NOT KNOW DOSE . Take every other day    [provider]  vitamin B-12 (CYANOCOBALAMIN) 1000 MCG tablet Take 1,000 mcg by mouth daily.    [provider]  vitamin C (ASCORBIC ACID) 500 MG tablet Take 500 mg by mouth daily.    [provider]    Family History Family History  Problem Relation Age of Onset  . Kidney cancer Mother   . Colon polyps Sister   . Colon cancer Neg Hx     Social History Social History  Substance Use Topics  . Smoking status: Former Smoker    Quit date: 10/10/1986  . Smokeless tobacco: Never  Used  . Alcohol use No     Allergies   Penicillins   Review of Systems Review of Systems  Respiratory: Positive for shortness of breath.   ROS: Statement: All systems negative except as marked or noted in the HPI; Constitutional: Negative for fever and chills. ; ; Eyes: Negative for eye pain, redness and discharge. ; ; ENMT: Negative for ear pain, hoarseness, nasal congestion, sinus pressure and sore throat. ; ; Cardiovascular: +"increased HR." Negative for chest pain, palpitations, diaphoresis, and peripheral edema. ; ; Respiratory: +SOB. Negative for cough, wheezing and stridor. ; ; Gastrointestinal: Negative for nausea, vomiting, diarrhea, abdominal pain, blood in stool, hematemesis, jaundice and rectal bleeding. . ; ; Genitourinary: Negative for dysuria, flank pain and hematuria. ; ; Musculoskeletal: Negative for back pain and neck pain. Negative for swelling and trauma.; ; Skin: Negative for pruritus, rash, abrasions, blisters, bruising and skin lesion.; ; Neuro: Negative for headache, lightheadedness and neck stiffness. Negative for weakness, altered level of consciousness, altered mental status, extremity weakness, paresthesias, involuntary movement, seizure and syncope.        Physical Exam Updated Vital Signs BP 121/81   Pulse (!) 107   Temp 98.1 F (36.7 C) (Oral)   Resp 18   Ht 5\' 8"  (1.727 m)   Wt 91.2 kg (201 lb)   SpO2 100%   BMI 30.56 kg/m   Patient Vitals for the past 24 hrs:  BP Temp Temp src Pulse Resp SpO2 Height Weight  05/14/17 1600 130/85 - - (!) 102 17 99 % - -  05/14/17 1500 (!) 115/103 - - (!) 105 (!) 26 96 % - -  05/14/17 1445 - - - - - 100 % - -  05/14/17 1400 140/89 - - (!) 115 20 100 % - -  05/14/17 1330 (!) 146/98 - - (!) 116 20 100 % - -  05/14/17 1300 121/81 - - (!) 107 18 100 % - -  05/14/17 1243 - - - - - 100 % - -  05/14/17 1218 138/87 98.1 F (36.7 C) Oral 74 18 97 % - -  05/14/17 1212 - - - - - - 5\' 8"  (1.727 m) 91.2 kg (201 lb)      Physical Exam 1345: Physical examination:  Nursing notes reviewed; Vital signs and O2 SAT reviewed;  Constitutional: Well developed, Well nourished, Well hydrated, In no acute distress; Head:  Normocephalic, atraumatic; Eyes: EOMI, PERRL, No scleral icterus; ENMT: Mouth and pharynx normal, Mucous membranes moist; Neck: Supple, Full range of motion, No lymphadenopathy; Cardiovascular: Irregular tachycardic rate and rhythm, No gallop; Respiratory: Breath  sounds coarse & equal bilaterally, No wheezes.  Speaking full sentences with ease, Normal respiratory effort/excursion; Chest: Nontender, Movement normal; Abdomen: Soft, Nontender, Nondistended, Normal bowel sounds; Genitourinary: No CVA tenderness; Extremities: Pulses normal, No tenderness, No edema, No calf edema or asymmetry.; Neuro: AA&Ox3, +HOH, otherwise major CN grossly intact.  Speech clear. No gross focal motor or sensory deficits in extremities.; Skin: Color normal, Warm, Dry.   ED Treatments / Results  Labs (all labs ordered are listed, but only abnormal results are displayed)   EKG  EKG Interpretation  Date/Time:  Thursday May 14 2017 12:16:42 EDT Ventricular Rate:  118 PR Interval:    QRS Duration: 78 QT Interval:  314 QTC Calculation: 440 R Axis:   86 Text Interpretation:  Atrial fibrillation Borderline right axis deviation Low voltage, precordial leads When compared with ECG of 03/26/2017 No significant change was found Confirmed by Francine Graven [phone]) on 05/14/2017 1:54:34 PM       Radiology   Procedures Procedures (including critical care time)  Medications Ordered in ED Medications  diltiazem (CARDIZEM) 1 mg/mL load via infusion 10 mg (not administered)    And  diltiazem (CARDIZEM) 100 mg in dextrose 5% 182mL (1 mg/mL) infusion (not administered)  furosemide (LASIX) injection 40 mg (not administered)  albuterol (PROVENTIL) (2.5 MG/3ML) 0.083% nebulizer solution 5 mg (5 mg Nebulization Given  05/14/17 1240)     Initial Impression / Assessment and Plan / ED Course  I have reviewed the triage vital signs and the nursing notes.  Pertinent labs & imaging results that were available during my care of the patient were reviewed by me and considered in my medical decision making (see chart for details).  MDM Reviewed: previous chart, nursing note and vitals Reviewed previous: labs and ECG Interpretation: labs, ECG and x-ray Total time providing critical care: 30-74 minutes. This excludes time spent performing separately reportable procedures and services. Consults: admitting MD   CRITICAL CARE Performed by: Alfonzo Feller Total critical care time: 35 minutes Critical care time was exclusive of separately billable procedures and treating other patients. Critical care was necessary to treat or prevent imminent or life-threatening deterioration. Critical care was time spent personally by me on the following activities: development of treatment plan with patient and/or surrogate as well as nursing, discussions with consultants, evaluation of patient's response to treatment, examination of patient, obtaining history from patient or surrogate, ordering and performing treatments and interventions, ordering and review of laboratory studies, ordering and review of radiographic studies, pulse oximetry and re-evaluation of patient's condition.   Results for orders placed or performed during the hospital encounter of 89/37/34  Basic metabolic panel  Result Value Ref Range   Sodium 139 135 - 145 mmol/L   Potassium 3.6 3.5 - 5.1 mmol/L   Chloride 104 101 - 111 mmol/L   CO2 26 22 - 32 mmol/L   Glucose, Bld 109 (H) 65 - 99 mg/dL   BUN 15 6 - 20 mg/dL   Creatinine, Ser 0.96 0.44 - 1.00 mg/dL   Calcium 9.2 8.9 - 10.3 mg/dL   GFR calc non Af Amer 53 (L) >60 mL/min   GFR calc Af Amer >60 >60 mL/min   Anion gap 9 5 - 15  Brain natriuretic peptide  Result Value Ref Range   B Natriuretic  Peptide 447.0 (H) 0.0 - 100.0 pg/mL  Troponin I  Result Value Ref Range   Troponin I <0.03 <0.03 ng/mL  CBC with Differential  Result Value Ref Range  WBC 11.2 (H) 4.0 - 10.5 K/uL   RBC 3.73 (L) 3.87 - 5.11 MIL/uL   Hemoglobin 9.9 (L) 12.0 - 15.0 g/dL   HCT 31.8 (L) 36.0 - 46.0 %   MCV 85.3 78.0 - 100.0 fL   MCH 26.5 26.0 - 34.0 pg   MCHC 31.1 30.0 - 36.0 g/dL   RDW 15.8 (H) 11.5 - 15.5 %   Platelets 270 150 - 400 K/uL   Neutrophils Relative % 74 %   Neutro Abs 8.4 (H) 1.7 - 7.7 K/uL   Lymphocytes Relative 13 %   Lymphs Abs 1.4 0.7 - 4.0 K/uL   Monocytes Relative 7 %   Monocytes Absolute 0.8 0.1 - 1.0 K/uL   Eosinophils Relative 5 %   Eosinophils Absolute 0.5 0.0 - 0.7 K/uL   Basophils Relative 1 %   Basophils Absolute 0.1 0.0 - 0.1 K/uL  Urinalysis, Routine w reflex microscopic  Result Value Ref Range   Color, Urine YELLOW YELLOW   APPearance CLEAR CLEAR   Specific Gravity, Urine 1.005 1.005 - 1.030   pH 6.0 5.0 - 8.0   Glucose, UA NEGATIVE NEGATIVE mg/dL   Hgb urine dipstick NEGATIVE NEGATIVE   Bilirubin Urine NEGATIVE NEGATIVE   Ketones, ur NEGATIVE NEGATIVE mg/dL   Protein, ur NEGATIVE NEGATIVE mg/dL   Nitrite NEGATIVE NEGATIVE   Leukocytes, UA NEGATIVE NEGATIVE   Dg Chest 2 View Result Date: 05/14/2017 CLINICAL DATA:  Shortness of breath.  Asthma and COPD. EXAM: CHEST  2 VIEW COMPARISON:  None. FINDINGS: Heart is enlarged. A diffuse interstitial pattern likely represents edema. Bilateral effusions are present. Bibasilar airspace disease likely reflects atelectasis. Infection is considered less likely. There is elevation of the right hemidiaphragm anteriorly. Linear airspace disease is present in the right upper lobe. Degenerative changes are noted in the thoracic spine and both shoulders. The visualized soft tissues and bony thorax are otherwise unremarkable. IMPRESSION: 1. Congestive heart failure. 2. Linear airspace opacity right upper lobe and airspace opacities at  both lung bases likely reflect atelectasis. Infection is considered less likely. Electronically Signed   By: San Morelle M.D.   On: 05/14/2017 13:35    1620:  Monitor afib/RVR with HR 140's on arrival to ED. IV cardizem bolus and gtt started with HR decreasing 90-100's.  BNP elevated with CHF on CXR; IV lasix given. Dx and testing d/w pt and family.  Questions answered.  Verb understanding, agreeable to admit.  T/C to [facility] Dr. Shanon Brow, case discussed, including:  HPI, pertinent PM/SHx, VS/PE, dx testing, ED course and treatment:  Agreeable to admit.       Final Clinical Impressions(s) / ED Diagnoses   Final diagnoses:  None    New Prescriptions New Prescriptions   No medications on file      Francine Graven, DO 05/17/17 1405

## 2017-05-15 ENCOUNTER — Telehealth: Payer: Self-pay | Admitting: Gastroenterology

## 2017-05-15 DIAGNOSIS — R0602 Shortness of breath: Secondary | ICD-10-CM

## 2017-05-15 DIAGNOSIS — J9611 Chronic respiratory failure with hypoxia: Secondary | ICD-10-CM

## 2017-05-15 LAB — BASIC METABOLIC PANEL
Anion gap: 9 (ref 5–15)
BUN: 14 mg/dL (ref 6–20)
CALCIUM: 8.9 mg/dL (ref 8.9–10.3)
CO2: 31 mmol/L (ref 22–32)
CREATININE: 1.11 mg/dL — AB (ref 0.44–1.00)
Chloride: 102 mmol/L (ref 101–111)
GFR calc non Af Amer: 45 mL/min — ABNORMAL LOW (ref >60)
GFR, EST AFRICAN AMERICAN: 52 mL/min — AB (ref >60)
Glucose, Bld: 95 mg/dL (ref 65–99)
Potassium: 3.5 mmol/L (ref 3.5–5.1)
SODIUM: 142 mmol/L (ref 135–145)

## 2017-05-15 LAB — MRSA PCR SCREENING: MRSA by PCR: NEGATIVE

## 2017-05-15 MED ORDER — DILTIAZEM HCL ER COATED BEADS 240 MG PO CP24: 240.0000 mg | ORAL_CAPSULE | Freq: Every day | ORAL | 0 refills | Status: DC

## 2017-05-15 MED ORDER — FUROSEMIDE 40 MG PO TABS
40.0000 mg | ORAL_TABLET | Freq: Every day | ORAL | Status: DC
  Administered 2017-05-15: 40 mg via ORAL
  Filled 2017-05-15: qty 1

## 2017-05-15 NOTE — Care Management Important Message (Signed)
Important Message  Patient Details  Name: Amber Hull MRN: [id number] Date of Birth: [birth year]   Medicare Important Message Given:  Yes    Jannatul Wojdyla, Chauncey Reading, RN 05/15/2017, 2:07 PM

## 2017-05-15 NOTE — Telephone Encounter (Signed)
PT SEEN IN ICU09. CLINICALLY IMPROVED WITH INCREASE DZM 240 MG DAILY. OK TO SCHEDULE FOR TCS/EGD WITH MAC, Dx: NORMOCYTIC ANEMIA WITHIN THE NEXT 4 WEEKS. MOVIPREP SPLIT DOSING. PT HAS OPV IN 6 WEEKS.

## 2017-05-15 NOTE — Care Management Note (Signed)
Case Management Note  Patient Details  Name: Amber Hull MRN: [id number] Date of Birth: 1932/01/29  Subjective/Objective:  Adm with Afib with RVR. From home alone, ind with ADL's. Wears continuous oxygen provided by [employer]. Has PCP-Dr. Margarite Gouge drives to appointments. Has insurance, reports no issues affording medications.                Action/Plan: Anticipate DC home with self care. Discussed potential NOAC with attending, NOAC on hold until further workup for dropping hemoglobin completed.    Expected Discharge Date:      05/17/2017            Expected Discharge Plan:  Home/Self Care  In-House Referral:     Discharge planning Services  CM Consult  Post Acute Care Choice:  NA Choice offered to:  NA  DME Arranged:    DME Agency:     HH Arranged:    HH Agency:     Status of Service:  Completed, signed off  If discussed at [employer] of Stay Meetings, dates discussed:    Additional Comments:  Howie Rufus, Chauncey Reading, RN 05/15/2017, 2:05 PM

## 2017-05-15 NOTE — Discharge Summary (Signed)
Physician Discharge Summary  Amber Hull ZOX:[id number] DOB: 1931-08-19 DOA: 05/14/2017  PCP: System, Pcp Not In  Admit date: 05/14/2017 Discharge date: 05/15/2017  Admitted From: Home Disposition:  Home  Recommendations for Outpatient Follow-up:  1. Follow up with PCP in 1-2 weeks 2. Schedule appointment with Dr. Oneida Alar for further evaluation of anemia 3. Schedule follow up with cardiology 4. Please obtain BMP/CBC in one week  Home Health: No  Equipment/Devices: Oxygen   Discharge Condition: Stable CODE STATUS: Full code Diet recommendation: Heart Healthy  Brief/Interim Summary: Amber Hull is a 83 y.o. female with medical history significant of chf, copd, crf on 3 liters at home, copd, was at dr fields office today for work up and evaluation of her anemia.  Pt was just hosptialized at [facility] and sent to follow up with GI.  She reports she saw dr [name] 9/19 (note in epic) and a pill was stopped, (lisiniopril).  Deemed not appropriate for anticoagulation until anemia worked up.  She denies any melena or brbpr.  No abd pain.  No fevers.  No swelling.  Sob is same as chronically.  Pt sent here from dr fields office for heartrate of 140s.  Found to be in afib with rvr.  Patient has a history of atrial fibrillation and was placed on a diltiazem drip.  She was transitioned to PO diltiazem at a higher dose than previous home dose.  She was given the dose of 240mg  on day of discharge and her heart rate was well controlled and her blood pressure was stable.  She and her daughter were given discharge instructions on to take increased dose of diltiazem and to get her anemia further evaluated by GI so she can start a blood thinner for her atrial fibrillation.  Per Dr. Court Joy last note on 04/01/17 patient not stable for blood thinner until her anemia is further evaluated.  Discharge Diagnoses:  Principal Problem:   Atrial fibrillation with RVR ([facility]) Active Problems:  Normocytic anemia   CHF (congestive heart failure) ([facility])   COPD (chronic obstructive pulmonary disease) ([facility])   Chronic respiratory failure with hypoxia ([facility])   SOB (shortness of breath)    Discharge Instructions  Discharge Instructions    Call MD for:  difficulty breathing, headache or visual disturbances    Complete by:  As directed    Call MD for:  extreme fatigue    Complete by:  As directed    Call MD for:  hives    Complete by:  As directed    Call MD for:  persistant dizziness or light-headedness    Complete by:  As directed    Call MD for:  persistant nausea and vomiting    Complete by:  As directed    Call MD for:  redness, tenderness, or signs of infection (pain, swelling, redness, odor or green/yellow discharge around incision site)    Complete by:  As directed    Call MD for:  severe uncontrolled pain    Complete by:  As directed    Call MD for:  temperature >100.4    Complete by:  As directed    Diet - low sodium heart healthy    Complete by:  As directed    Increase activity slowly    Complete by:  As directed      Allergies as of 05/15/2017      Reactions   Penicillins Rash   Has patient had a PCN reaction causing immediate rash, facial/tongue/throat swelling,  SOB or lightheadedness with hypotension: No Has patient had a PCN reaction causing severe rash involving mucus membranes or skin necrosis: No Has patient had a PCN reaction that required hospitalization: No Has patient had a PCN reaction occurring within the last 10 years: No If all of the above answers are "NO", then may proceed with Cephalosporin use.      Medication List    TAKE these medications   albuterol 108 (90 Base) MCG/ACT inhaler Commonly known as:  PROVENTIL HFA;VENTOLIN HFA Inhale into the lungs every 6 (six) hours as needed for wheezing or shortness of breath.   albuterol (2.5 MG/3ML) 0.083% nebulizer solution Commonly known as:  PROVENTIL Take 2.5 mg by nebulization every 6 (six)  hours as needed for wheezing or shortness of breath.   budesonide-formoterol 160-4.5 MCG/ACT inhaler Commonly known as:  SYMBICORT Inhale 2 puffs into the lungs 2 (two) times daily.   calcium carbonate 600 MG Tabs tablet Commonly known as:  OS-CAL Take 600 mg by mouth 2 (two) times daily with a meal.   cholecalciferol 1000 units tablet Commonly known as:  VITAMIN D Take 1,000 Units by mouth daily.   diltiazem 240 MG 24 hr capsule Commonly known as:  CARDIZEM CD Take 1 capsule (240 mg total) by mouth daily. What changed:  medication strength  how much to take   esomeprazole 20 MG capsule Commonly known as:  NEXIUM Take 20 mg by mouth daily at 12 noon.   furosemide 40 MG tablet Commonly known as:  LASIX Take 40 mg by mouth.   gabapentin 400 MG capsule Commonly known as:  NEURONTIN Take 400 mg by mouth at bedtime.   hydrOXYzine 25 MG tablet Commonly known as:  ATARAX/VISTARIL Take 25 mg by mouth at bedtime.   MAGNESIUM PO Take by mouth. Twice a week   montelukast 10 MG tablet Commonly known as:  SINGULAIR Take 10 mg by mouth at bedtime.   vitamin B-12 1000 MCG tablet Commonly known as:  CYANOCOBALAMIN Take 1,000 mcg by mouth daily.   vitamin C 500 MG tablet Commonly known as:  ASCORBIC ACID Take 500 mg by mouth daily.       Allergies  Allergen Reactions  . Penicillins Rash    Has patient had a PCN reaction causing immediate rash, facial/tongue/throat swelling, SOB or lightheadedness with hypotension: No Has patient had a PCN reaction causing severe rash involving mucus membranes or skin necrosis: No Has patient had a PCN reaction that required hospitalization: No Has patient had a PCN reaction occurring within the last 10 years: No If all of the above answers are "NO", then may proceed with Cephalosporin use.     Consultations:  None   Procedures/Studies: Dg Chest 2 View  Result Date: 05/14/2017 CLINICAL DATA:  Shortness of breath.  Asthma and  COPD. EXAM: CHEST  2 VIEW COMPARISON:  None. FINDINGS: Heart is enlarged. A diffuse interstitial pattern likely represents edema. Bilateral effusions are present. Bibasilar airspace disease likely reflects atelectasis. Infection is considered less likely. There is elevation of the right hemidiaphragm anteriorly. Linear airspace disease is present in the right upper lobe. Degenerative changes are noted in the thoracic spine and both shoulders. The visualized soft tissues and bony thorax are otherwise unremarkable. IMPRESSION: 1. Congestive heart failure. 2. Linear airspace opacity right upper lobe and airspace opacities at both lung bases likely reflect atelectasis. Infection is considered less likely. Electronically Signed   By: San Morelle M.D.   On: 05/14/2017 13:35  Subjective: Patient anxious to leave this am.  Very hard of hearing and difficult time understanding what was discussed- asked if provider would call her daughter.  Denies chest pain, palpitations.  Feels much better.  Discharge Exam: Vitals:   05/15/17 1400 05/15/17 1500  BP: 114/83   Pulse: 84 91  Resp: 19 19  Temp:    SpO2: 100% 99%   Vitals:   05/15/17 1200 05/15/17 1300 05/15/17 1400 05/15/17 1500  BP: 128/68 127/82 114/83   Pulse: 96 82 84 91  Resp: (!) 28 (!) 22 19 19   Temp:      TempSrc:      SpO2: 98% 97% 100% 99%  Weight:      Height:        General: Pt is alert, awake, not in acute distress Cardiovascular: IRRR, S1/S2 +, no rubs, no gallops Respiratory: CTA bilaterally, no wheezing, no rhonchi Abdominal: Soft, NT, ND, bowel sounds + Extremities: no edema, no cyanosis    The results of significant diagnostics from this hospitalization (including imaging, microbiology, ancillary and laboratory) are listed below for reference.     Microbiology: Recent Results (from the past 240 hour(s))  MRSA PCR Screening     Status: None   Collection Time: 05/14/17  7:37 PM  Result Value Ref Range  Status   MRSA by PCR NEGATIVE NEGATIVE Final    Comment:        The GeneXpert MRSA Assay (FDA approved for NASAL specimens only), is one component of a comprehensive MRSA colonization surveillance program. It is not intended to diagnose MRSA infection nor to guide or monitor treatment for MRSA infections.      Labs: BNP (last 3 results)  Recent Labs  05/14/17 1442  BNP 426.8*   Basic Metabolic Panel:  Recent Labs Lab 05/14/17 1442 05/15/17 0415  NA 139 142  K 3.6 3.5  CL 104 102  CO2 26 31  GLUCOSE 109* 95  BUN 15 14  CREATININE 0.96 1.11*  CALCIUM 9.2 8.9   Liver Function Tests: No results for input(s): AST, ALT, ALKPHOS, BILITOT, PROT, ALBUMIN in the last 168 hours. No results for input(s): LIPASE, AMYLASE in the last 168 hours. No results for input(s): AMMONIA in the last 168 hours. CBC:  Recent Labs Lab 05/14/17 1442  WBC 11.2*  NEUTROABS 8.4*  HGB 9.9*  HCT 31.8*  MCV 85.3  PLT 270   Cardiac Enzymes:  Recent Labs Lab 05/14/17 1442  TROPONINI <0.03   BNP: Invalid input(s): POCBNP CBG: No results for input(s): GLUCAP in the last 168 hours. D-Dimer No results for input(s): DDIMER in the last 72 hours. Hgb A1c No results for input(s): HGBA1C in the last 72 hours. Lipid Profile No results for input(s): CHOL, HDL, LDLCALC, TRIG, CHOLHDL, LDLDIRECT in the last 72 hours. Thyroid function studies No results for input(s): TSH, T4TOTAL, T3FREE, THYROIDAB in the last 72 hours.  Invalid input(s): FREET3 Anemia work up No results for input(s): VITAMINB12, FOLATE, FERRITIN, TIBC, IRON, RETICCTPCT in the last 72 hours. Urinalysis    Component Value Date/Time   COLORURINE YELLOW 05/14/2017 1342   APPEARANCEUR CLEAR 05/14/2017 1342   LABSPEC 1.005 05/14/2017 1342   PHURINE 6.0 05/14/2017 1342   GLUCOSEU NEGATIVE 05/14/2017 1342   HGBUR NEGATIVE 05/14/2017 1342   BILIRUBINUR NEGATIVE 05/14/2017 1342   KETONESUR NEGATIVE 05/14/2017 1342    PROTEINUR NEGATIVE 05/14/2017 1342   NITRITE NEGATIVE 05/14/2017 1342   LEUKOCYTESUR NEGATIVE 05/14/2017 1342   Sepsis Labs Invalid input(s):  PROCALCITONIN,  WBC,  LACTICIDVEN Microbiology Recent Results (from the past 240 hour(s))  MRSA PCR Screening     Status: None   Collection Time: 05/14/17  7:37 PM  Result Value Ref Range Status   MRSA by PCR NEGATIVE NEGATIVE Final    Comment:        The GeneXpert MRSA Assay (FDA approved for NASAL specimens only), is one component of a comprehensive MRSA colonization surveillance program. It is not intended to diagnose MRSA infection nor to guide or monitor treatment for MRSA infections.      Time coordinating discharge: 35 minutes  SIGNED:   Loretha Stapler, MD  [facility] 05/15/2017, 3:24 PM Pager [phone] If 7PM-7AM, please contact night-coverage [email or web address] Password [email or web address]

## 2017-05-18 ENCOUNTER — Ambulatory Visit: Payer: Medicare HMO | Admitting: Cardiovascular Disease

## 2017-05-18 ENCOUNTER — Encounter: Payer: Self-pay | Admitting: Cardiovascular Disease

## 2017-05-18 VITALS — BP 142/60 | HR 93 | Ht 68.0 in | Wt 194.0 lb

## 2017-05-18 DIAGNOSIS — D649 Anemia, unspecified: Secondary | ICD-10-CM | POA: Diagnosis not present

## 2017-05-18 DIAGNOSIS — I4891 Unspecified atrial fibrillation: Secondary | ICD-10-CM | POA: Diagnosis not present

## 2017-05-18 DIAGNOSIS — R9439 Abnormal result of other cardiovascular function study: Secondary | ICD-10-CM | POA: Diagnosis not present

## 2017-05-18 DIAGNOSIS — Z9289 Personal history of other medical treatment: Secondary | ICD-10-CM | POA: Diagnosis not present

## 2017-05-18 DIAGNOSIS — I050 Rheumatic mitral stenosis: Secondary | ICD-10-CM | POA: Diagnosis not present

## 2017-05-18 DIAGNOSIS — I5033 Acute on chronic diastolic (congestive) heart failure: Secondary | ICD-10-CM

## 2017-05-18 MED ORDER — FUROSEMIDE 40 MG PO TABS: 40.0000 mg | ORAL_TABLET | Freq: Two times a day (BID) | ORAL | 6 refills | Status: DC

## 2017-05-18 MED ORDER — POTASSIUM CHLORIDE CRYS ER 20 MEQ PO TBCR: 20.0000 meq | EXTENDED_RELEASE_TABLET | Freq: Every day | ORAL | 6 refills | Status: DC

## 2017-05-18 NOTE — Telephone Encounter (Signed)
[email or web address]

## 2017-05-18 NOTE — Addendum Note (Signed)
Addended by: Laurine Blazer on: 05/18/2017 03:42 PM   Modules accepted: Orders

## 2017-05-18 NOTE — Progress Notes (Signed)
SUBJECTIVE: The patient presents for posthospitalization follow-up.  She had rapid atrial fibrillation and was placed on intravenous diltiazem which was transitioned to long-acting diltiazem 240 mg daily.  This was initially noted when she was being seen by gastroenterology in their office.  I reviewed all records.  Dr. Oneida Alar felt that her anemia was likely due to chronic renal insufficiency and possibly occult GI blood loss due to gastritis and polyps.  Hemoglobin 9.9 on 05/14/17.  BNP was mildly elevated at 447.  Troponin was normal.  Chest x-ray showed CHF.  Prior to this, she was hospitalized at [facility] for rapid atrial fibrillation and diastolic heart failure.  Echocardiogram reportedly demonstrated normal left ventricular systolic function, LVEF 86-76%, with mild to moderate mitral stenosis, mitral annular calcification, and mild left atrial enlargement.  She is feeling better today.  She said "my legs give out if I walk for too long ".  She denies shortness of breath.  She is using oxygen and O2 saturations are 98%.  She is on Lasix 40 mg daily.  She is not on supplemental potassium.  Last potassium level was low normal at 3.5.  She occasionally has a tightness in the center of her chest alleviated with belching.   Review of Systems: As per "subjective", otherwise negative.  Allergies  Allergen Reactions  . Penicillins Rash    Has patient had a PCN reaction causing immediate rash, facial/tongue/throat swelling, SOB or lightheadedness with hypotension: No Has patient had a PCN reaction causing severe rash involving mucus membranes or skin necrosis: No Has patient had a PCN reaction that required hospitalization: No Has patient had a PCN reaction occurring within the last 10 years: No If all of the above answers are "NO", then may proceed with Cephalosporin use.     Current Outpatient Medications  Medication Sig Dispense Refill  . albuterol (PROVENTIL  HFA;VENTOLIN HFA) 108 (90 BASE) MCG/ACT inhaler Inhale into the lungs every 6 (six) hours as needed for wheezing or shortness of breath.    Marland Kitchen albuterol (PROVENTIL) (2.5 MG/3ML) 0.083% nebulizer solution Take 2.5 mg by nebulization every 6 (six) hours as needed for wheezing or shortness of breath.    . budesonide-formoterol (SYMBICORT) 160-4.5 MCG/ACT inhaler Inhale 2 puffs into the lungs 2 (two) times daily.    . calcium carbonate (OS-CAL) 600 MG TABS tablet Take 600 mg by mouth 2 (two) times daily with a meal.    . cholecalciferol (VITAMIN D) 1000 UNITS tablet Take 1,000 Units by mouth daily.    Marland Kitchen diltiazem (CARDIZEM CD) 240 MG 24 hr capsule Take 1 capsule (240 mg total) by mouth daily. 30 capsule 0  . esomeprazole (NEXIUM) 20 MG capsule Take 20 mg by mouth daily at 12 noon.    . furosemide (LASIX) 40 MG tablet Take 40 mg by mouth.    . gabapentin (NEURONTIN) 400 MG capsule Take 400 mg by mouth at bedtime.    . hydrOXYzine (ATARAX/VISTARIL) 25 MG tablet Take 25 mg by mouth at bedtime.    Marland Kitchen MAGNESIUM PO Take by mouth. Twice a week    . montelukast (SINGULAIR) 10 MG tablet Take 10 mg by mouth at bedtime.    . vitamin B-12 (CYANOCOBALAMIN) 1000 MCG tablet Take 1,000 mcg by mouth daily.    . vitamin C (ASCORBIC ACID) 500 MG tablet Take 500 mg by mouth daily.     No current facility-administered medications for this visit.     Past Medical History:  Diagnosis Date  . Anemia   . Arthritis   . Asthma   . Atrial fibrillation ([place])   . CHF (congestive heart failure) ([place])   . COPD (chronic obstructive pulmonary disease) ([place])   . History of nuclear stress test 2017   intermediate risk  . History of shingles   . HOH (hard of hearing)   . Mitral stenosis   . Neuralgia   . On home O2    3L N/C    Past Surgical History:  Procedure Laterality Date  . ABDOMINAL HYSTERECTOMY    . CHOLECYSTECTOMY    . KNEE ARTHROSCOPY     left  . TUBAL LIGATION    . TYMPANOSTOMY TUBE PLACEMENT     both  ears    Social History   Socioeconomic History  . Marital status: Widowed    Spouse name: Not on file  . Number of children: Not on file  . Years of education: Not on file  . Highest education level: Not on file  Social Needs  . Financial resource strain: Not on file  . Food insecurity - worry: Not on file  . Food insecurity - inability: Not on file  . Transportation needs - medical: Not on file  . Transportation needs - non-medical: Not on file  Occupational History  . Not on file  Tobacco Use  . Smoking status: Former Smoker    Last attempt to quit: 10/10/1986    Years since quitting: 30.6  . Smokeless tobacco: Never Used  Substance and Sexual Activity  . Alcohol use: No  . Drug use: No  . Sexual activity: Not on file  Other Topics Concern  . Not on file  Social History Narrative  . Not on file     Vitals:   05/18/17 1454  BP: (!) 142/60  Pulse: 93  SpO2: 98%  Weight: 194 lb (88 kg)  Height: 5\' 8"  (1.727 m)    Wt Readings from Last 3 Encounters:  05/18/17 194 lb (88 kg)  05/15/17 196 lb 13.9 oz (89.3 kg)  05/14/17 201 lb (91.2 kg)     PHYSICAL EXAM General: NAD HEENT: Normal. Neck: No JVD, no thyromegaly. Lungs: Bilateral diffuse crackles. CV: Regular rate and irregular rhythm, normal S1/S2, no S3, no murmur. No pretibial or periankle edema.  Abdomen: Soft, nontender, no distention.  Neurologic: Alert and oriented.  Psych: Normal affect. Skin: Normal. Musculoskeletal: No gross deformities.    ECG: Most recent ECG reviewed.   Labs: Lab Results  Component Value Date/Time   K 3.5 05/15/2017 04:15 AM   BUN 14 05/15/2017 04:15 AM   CREATININE 1.11 (H) 05/15/2017 04:15 AM   HGB 9.9 (L) 05/14/2017 02:42 PM     Lipids: No results found for: LDLCALC, LDLDIRECT, CHOL, TRIG, HDL     ASSESSMENT AND PLAN:  1. Atrial fibrillation: Currently rate controlled on long-acting diltiazem 240 mg daily. She cannot be safely anticoagulated until the  etiology of her anemia is evaluated and treated. I would consider increasing diltiazem dose at her next visit but will reevaluate at that time.  2. Acute on chronic diastolic heart failure: She has diffuse bilateral crackles.  I will increase Lasix to 40 mg twice daily and add supplemental potassium 20 mEq daily.  I will obtain a basic metabolic panel in 1 week.  3. Mild to moderate mitral stenosis: I will monitor this clinically and with surveillance echocardiography.  4. Anemia: She will need a GI workup before anticoagulation can be  safely prescribed. I have already made a GI referral for upper endoscopy and colonoscopy.  5.  Hypertension: Blood pressure is mildly elevated.  Continue long-acting diltiazem 240 mg daily.  6. Abnormal stress test: Troponins were normal wall hospitalized. She denies chest pain. She may eventually need coronary angiography. I will monitor. Left ventricular systolic function is normal.     Disposition: Follow up 2 weeks.   Kate Sable, M.D., F.A.C.C.

## 2017-05-18 NOTE — Patient Instructions (Signed)
Medication Instructions:   Increase Lasix to 40mg  twice a day (with evening dose around 4:00 pm).  Begin Potassium 7meq daily.  Continue all other medications.    Labwork:  BMET - due in 1 week.  Order given today.  Office will contact with results via phone or letter.    Testing/Procedures: none  Follow-Up: 2 weeks   Any Other Special Instructions Will Be Listed Below (If Applicable).  If you need a refill on your cardiac medications before your next appointment, please call your pharmacy.

## 2017-05-19 NOTE — Telephone Encounter (Signed)
[email or web address]

## 2017-05-19 NOTE — Progress Notes (Signed)
CC'D TO PCP °

## 2017-05-21 NOTE — Telephone Encounter (Signed)
Patient called back. Spoke with her regarding Dr. Oneida Alar recs regarding TCS/EGD. She wants to discuss this with Dr. Oneida Alar at her visit scheduled for 06/18/17. FYI to Dr. Oneida Alar.

## 2017-05-21 NOTE — Telephone Encounter (Signed)
REVIEWED-NO ADDITIONAL RECOMMENDATIONS. 

## 2017-06-08 ENCOUNTER — Ambulatory Visit (INDEPENDENT_AMBULATORY_CARE_PROVIDER_SITE_OTHER): Payer: Medicare HMO | Admitting: Cardiovascular Disease

## 2017-06-08 ENCOUNTER — Encounter: Payer: Self-pay | Admitting: Cardiovascular Disease

## 2017-06-08 VITALS — BP 138/64 | HR 86 | Ht 68.0 in | Wt 197.0 lb

## 2017-06-08 DIAGNOSIS — R9439 Abnormal result of other cardiovascular function study: Secondary | ICD-10-CM | POA: Diagnosis not present

## 2017-06-08 DIAGNOSIS — I4891 Unspecified atrial fibrillation: Secondary | ICD-10-CM | POA: Diagnosis not present

## 2017-06-08 DIAGNOSIS — I050 Rheumatic mitral stenosis: Secondary | ICD-10-CM

## 2017-06-08 DIAGNOSIS — I5033 Acute on chronic diastolic (congestive) heart failure: Secondary | ICD-10-CM | POA: Diagnosis not present

## 2017-06-08 DIAGNOSIS — D649 Anemia, unspecified: Secondary | ICD-10-CM

## 2017-06-08 MED ORDER — DILTIAZEM HCL ER COATED BEADS 360 MG PO CP24: 360.0000 mg | ORAL_CAPSULE | Freq: Every day | ORAL | 6 refills | Status: DC

## 2017-06-08 NOTE — Patient Instructions (Signed)
Medication Instructions:   Increase Diltiazem CD to 360mg  daily.  Continue all other medications.    Labwork: none  Testing/Procedures: none  Follow-Up: January   Any Other Special Instructions Will Be Listed Below (If Applicable).  If you need a refill on your cardiac medications before your next appointment, please call your pharmacy.

## 2017-06-08 NOTE — Progress Notes (Signed)
SUBJECTIVE: The patient presents for follow-up of atrial fibrillation and diastolic heart failure.  She said her breathing feels much better.  She has occasional left-sided chest pressure improved with belching and drinking Dr. Malachi Bonds.  She did not take her Lasix this morning.  She has problems with bilateral calf cramping.  She takes potassium, magnesium, and eats bananas.     Review of Systems: As per "subjective", otherwise negative.  Allergies  Allergen Reactions  . Penicillins Rash    Has patient had a PCN reaction causing immediate rash, facial/tongue/throat swelling, SOB or lightheadedness with hypotension: No Has patient had a PCN reaction causing severe rash involving mucus membranes or skin necrosis: No Has patient had a PCN reaction that required hospitalization: No Has patient had a PCN reaction occurring within the last 10 years: No If all of the above answers are "NO", then may proceed with Cephalosporin use.     Current Outpatient Medications  Medication Sig Dispense Refill  . albuterol (PROVENTIL HFA;VENTOLIN HFA) 108 (90 BASE) MCG/ACT inhaler Inhale into the lungs every 6 (six) hours as needed for wheezing or shortness of breath.    Marland Kitchen albuterol (PROVENTIL) (2.5 MG/3ML) 0.083% nebulizer solution Take 2.5 mg by nebulization every 6 (six) hours as needed for wheezing or shortness of breath.    . budesonide-formoterol (SYMBICORT) 160-4.5 MCG/ACT inhaler Inhale 2 puffs into the lungs 2 (two) times daily.    . calcium carbonate (OS-CAL) 600 MG TABS tablet Take 600 mg by mouth 2 (two) times daily with a meal.    . cholecalciferol (VITAMIN D) 1000 UNITS tablet Take 1,000 Units by mouth daily.    Marland Kitchen diltiazem (CARDIZEM CD) 240 MG 24 hr capsule Take 1 capsule (240 mg total) by mouth daily. 30 capsule 0  . esomeprazole (NEXIUM) 20 MG capsule Take 20 mg by mouth daily at 12 noon.    . furosemide (LASIX) 40 MG tablet Take 1 tablet (40 mg total) 2 (two) times daily by mouth. 60  tablet 6  . gabapentin (NEURONTIN) 400 MG capsule Take 400 mg by mouth at bedtime.    . hydrOXYzine (ATARAX/VISTARIL) 25 MG tablet Take 25 mg by mouth at bedtime.    Marland Kitchen MAGNESIUM PO Take by mouth. Twice a week    . montelukast (SINGULAIR) 10 MG tablet Take 10 mg by mouth at bedtime.    . potassium chloride SA (K-DUR,KLOR-CON) 20 MEQ tablet Take 1 tablet (20 mEq total) daily by mouth. 30 tablet 6  . vitamin B-12 (CYANOCOBALAMIN) 1000 MCG tablet Take 1,000 mcg by mouth daily.    . vitamin C (ASCORBIC ACID) 500 MG tablet Take 500 mg by mouth daily.     No current facility-administered medications for this visit.     Past Medical History:  Diagnosis Date  . Anemia   . Arthritis   . Asthma   . Atrial fibrillation ([place])   . CHF (congestive heart failure) ([place])   . COPD (chronic obstructive pulmonary disease) ([place])   . History of nuclear stress test 2017   intermediate risk  . History of shingles   . HOH (hard of hearing)   . Mitral stenosis   . Neuralgia   . On home O2    3L N/C    Past Surgical History:  Procedure Laterality Date  . ABDOMINAL HYSTERECTOMY    . CHOLECYSTECTOMY    . FASCIECTOMY Right 10/17/2014   Procedure: FASCIECTOMY RIGHT RING/SMALL FINGERS;  Surgeon: Daryll Brod, MD;  Location:  [place];  Service: Orthopedics;  Laterality: Right;  . KNEE ARTHROSCOPY     left  . TUBAL LIGATION    . TYMPANOSTOMY TUBE PLACEMENT     both ears    Social History   Socioeconomic History  . Marital status: Widowed    Spouse name: Not on file  . Number of children: Not on file  . Years of education: Not on file  . Highest education level: Not on file  Social Needs  . Financial resource strain: Not on file  . Food insecurity - worry: Not on file  . Food insecurity - inability: Not on file  . Transportation needs - medical: Not on file  . Transportation needs - non-medical: Not on file  Occupational History  . Not on file  Tobacco Use  . Smoking status:  Former Smoker    Last attempt to quit: 10/10/1986    Years since quitting: 30.6  . Smokeless tobacco: Never Used  Substance and Sexual Activity  . Alcohol use: No  . Drug use: No  . Sexual activity: Not on file  Other Topics Concern  . Not on file  Social History Narrative  . Not on file     Vitals:   06/08/17 1348  BP: 138/64  Pulse: 86  SpO2: 97%  Weight: 197 lb (89.4 kg)  Height: 5\' 8"  (1.727 m)    Wt Readings from Last 3 Encounters:  06/08/17 197 lb (89.4 kg)  05/18/17 194 lb (88 kg)  05/15/17 196 lb 13.9 oz (89.3 kg)     PHYSICAL EXAM General: NAD HEENT: Normal. Neck: No JVD, no thyromegaly. Lungs: Diffuse crackles bilaterally. CV: Heart rate at upper normal limits and irregular rhythm, normal S1/S2, no S3, no murmur. No pretibial or periankle edema.  Abdomen: Soft, nontender, no distention.  Neurologic: Alert and oriented.  Psych: Normal affect. Skin: Normal. Musculoskeletal: No gross deformities.    ECG: Most recent ECG reviewed.   Labs: Lab Results  Component Value Date/Time   K 3.5 05/15/2017 04:15 AM   BUN 14 05/15/2017 04:15 AM   CREATININE 1.11 (H) 05/15/2017 04:15 AM   HGB 9.9 (L) 05/14/2017 02:42 PM     Lipids: No results found for: LDLCALC, LDLDIRECT, CHOL, TRIG, HDL     ASSESSMENT AND PLAN: 1. Atrial fibrillation: Currently rate is suboptimally controlled on long-acting diltiazem 240 mg daily.  I will increase to 360 mg daily.  She cannot be safely anticoagulated until the etiology of her anemia is evaluated and treated.  She sees GI on December 6.  2. Acute on chronic diastolic heart failure:She has symptomatically improved but I still appreciate crackles on exam.  She did not take her Lasix this morning as she did not want to urinate frequently on her way to our office today.  I will increase diltiazem to 360 mg daily to improve heart rate control, as this should help to prevent frequent diastolic heart failure exacerbations.   Continue Lasix 40 mg twice daily with supplemental potassium.  3. Mild to moderate mitral stenosis: I will monitor this clinically and with surveillance echocardiography.  4. Anemia: She will need a GI workup before anticoagulation can be safely prescribed.I have already made a GI referral for upper endoscopy and colonoscopy.  She sees them on December 6.  5.  Hypertension: Blood pressure is normal.  I will monitor given increased dose of diltiazem as noted above.  6. Abnormal stress test: Troponins were normal while hospitalized.  She  has chest pains alleviated with belching and drinking Dr. Malachi Bonds, suggestive of a GI etiology. She may eventually need coronary angiography. I will monitor symptoms. Left ventricular systolic function is normal.      Disposition: Follow up January 2019   Kate Sable, M.D., F.A.C.C.

## 2017-06-17 ENCOUNTER — Ambulatory Visit: Payer: Medicare HMO | Admitting: Gastroenterology

## 2017-06-18 ENCOUNTER — Other Ambulatory Visit: Payer: Self-pay | Admitting: *Deleted

## 2017-06-18 ENCOUNTER — Encounter: Payer: Self-pay | Admitting: *Deleted

## 2017-06-18 ENCOUNTER — Encounter: Payer: Self-pay | Admitting: Gastroenterology

## 2017-06-18 ENCOUNTER — Telehealth: Payer: Self-pay | Admitting: *Deleted

## 2017-06-18 ENCOUNTER — Ambulatory Visit: Payer: Medicare HMO | Admitting: Gastroenterology

## 2017-06-18 DIAGNOSIS — D649 Anemia, unspecified: Secondary | ICD-10-CM

## 2017-06-18 MED ORDER — NA SULFATE-K SULFATE-MG SULF 17.5-3.13-1.6 GM/177ML PO SOLN: 1.0000 | ORAL | 0 refills | Status: DC

## 2017-06-18 NOTE — Progress Notes (Signed)
Subjective:    Patient ID: Amber Hull, female    DOB: 1932/06/12, 83 y.o.   MRN: [id number]  Amber Mealy, MD   HPI No questions or concerns. No bowel problems. Breathing: as good as usual. Heart is a little out of synch. Up her DZM TO 360 MG.  BOWELS MOVING: A LITTLE SLUGGISH THIS WEEK BUT THIS IS NOT NEW. RATHER NOT HAVE THE ENDOSCOPY. PT ON 3Ls O2.  PT DENIES FEVER, CHILLS, HEMATOCHEZIA, nausea, vomiting, melena, diarrhea, CHEST PAIN, CHANGE IN BOWEL IN HABITS, constipation, abdominal pain, problems swallowing, problems with sedation, OR heartburn or indigestion.  Past Medical History:  Diagnosis Date  . Anemia   . Arthritis   . Asthma   . Atrial fibrillation ([place])   . CHF (congestive heart failure) ([place])   . COPD (chronic obstructive pulmonary disease) ([place])   . History of nuclear stress test 2017   intermediate risk  . History of shingles   . HOH (hard of hearing)   . Mitral stenosis   . Neuralgia   . On home O2    3L N/C    Past Surgical History:  Procedure Laterality Date  . ABDOMINAL HYSTERECTOMY    . CHOLECYSTECTOMY    . FASCIECTOMY Right 10/17/2014   Procedure: FASCIECTOMY RIGHT RING/SMALL FINGERS;  Surgeon: Daryll Brod, MD;  Location: [place];  Service: Orthopedics;  Laterality: Right;  . KNEE ARTHROSCOPY     left  . TUBAL LIGATION    . TYMPANOSTOMY TUBE PLACEMENT     both ears    Allergies  Allergen Reactions  . Penicillins Rash       Current Outpatient Medications  Medication Sig Dispense Refill  . albuterol (PROVENTIL HFA;VENTOLIN HFA) 108 (90 BASE) MCG/ACT inhaler Inhale into the lungs every 6 (six) hours as needed for wheezing or shortness of breath.    Marland Kitchen albuterol (PROVENTIL) (2.5 MG/3ML) 0.083% nebulizer solution Take 2.5 mg by nebulization every 6 (six) hours as needed for wheezing or shortness of breath.    . budesonide-formoterol (SYMBICORT) 160-4.5 MCG/ACT inhaler Inhale 2 puffs into the lungs 2 (two) times  daily.    . calcium carbonate (OS-CAL) 600 MG TABS tablet Take 600 mg by mouth. Doesn't take everyday    . cholecalciferol (VITAMIN D) 1000 UNITS tablet Take 1,000 Units by mouth daily.    Marland Kitchen diltiazem (CARDIZEM CD) 360 MG 24 hr capsule Take 1 capsule (360 mg total) by mouth daily.    Marland Kitchen esomeprazole (NEXIUM) 20 MG capsule Take 20 mg by mouth daily.     . furosemide (LASIX) 40 MG tablet Take 1 tablet (40 mg total) 2 (two) times daily by mouth.    . gabapentin (NEURONTIN) 400 MG capsule Take 400 mg by mouth at bedtime.    . hydrOXYzine (ATARAX/VISTARIL) 25 MG tablet Take 25 mg by mouth at bedtime.    Marland Kitchen MAGNESIUM PO Take by mouth. Twice a week    . montelukast (SINGULAIR) 10 MG tablet Take 10 mg by mouth at bedtime.    . potassium chloride SA (K-DUR,KLOR-CON) 20 MEQ tablet Take 1 tablet (20 mEq total) daily by mouth.    . vitamin B-12 (CYANOCOBALAMIN) 1000 MCG tablet Take 1,000 mcg by mouth daily.    . vitamin C (ASCORBIC ACID) 500 MG tablet Take 500 mg by mouth daily.     Review of Systems PER HPI OTHERWISE ALL SYSTEMS ARE NEGATIVE.    Objective:   Physical Exam  Constitutional: She is  oriented to person, place, and time. She appears well-developed and well-nourished. No distress.  HENT:  Head: Normocephalic and atraumatic.  Mouth/Throat: Oropharynx is clear and moist. No oropharyngeal exudate.  NASAL CANULA IN PLACE  Eyes: Pupils are equal, round, and reactive to light. No scleral icterus.  Neck: Normal range of motion. Neck supple.  Cardiovascular: Normal rate and normal heart sounds.  Irregular RHYTHM  Pulmonary/Chest: Effort normal and breath sounds normal. No respiratory distress.  Abdominal: Soft. Bowel sounds are normal. She exhibits no distension. There is no tenderness.  Musculoskeletal: She exhibits no edema.  Lymphadenopathy:    She has no cervical adenopathy.  Neurological: She is alert and oriented to person, place, and time.  Psychiatric: She has a normal mood and affect.   Vitals reviewed.     Assessment & Plan:

## 2017-06-18 NOTE — Assessment & Plan Note (Signed)
NO BRBPR OR MELENA. LAST W/U DELAYED DUE TO PT HAD AFIB WITH RVR AND WAS SENT TO THE ED.   COMPLETE COLONOSCOPY AND UPPER ENDOSCOPY W/ MAC DUE TO TENUOUS RESPIRATORY STATUS IN JAN 2019. DISCUSSED PROCEDURE, BENEFITS, & RISKS: < 1% chance of medication reaction, bleeding, perforation, or rupture of spleen/liver. FOLLOW A CLEAR LIQUID DIET ON THE DAY BEFORE THE COLONOSCOPY. IF YOU GET HUNGRY IT IS OKAY TO HAVE ONE SERVING OF MASHED POTATOES OR SCRAMBLED EGGS(2) ON THE DAY BEFORE YOUR COLONOSCOPY. FOLLOW UP IN 4 MOS.

## 2017-06-18 NOTE — Patient Instructions (Addendum)
COMPLETE COLONOSCOPY AND UPPER ENDOSCOPY IN JAN 2019.  FOLLOW A CLEAR LIQUID DIET ON THE DAY BEFORE THE COLONOSCOPY. IF YOU GET HUNGRY IT IS OKAY TO HAVE ONE SERVING OF MASHED POTATOES OR SCRAMBLED EGGS(2) ON THE DAY BEFORE YOUR COLONOSCOPY.  FOLLOW UP IN 4 MOS.

## 2017-06-18 NOTE — Telephone Encounter (Signed)
LMOVM. Pre-op appt scheduled for 07/28/17 at 10:00am. Letter mailed to pt.

## 2017-06-19 NOTE — Progress Notes (Signed)
cc'ed to pcp °

## 2017-07-01 ENCOUNTER — Ambulatory Visit: Payer: Medicare HMO | Admitting: Gastroenterology

## 2017-07-24 ENCOUNTER — Encounter: Payer: Self-pay | Admitting: Cardiovascular Disease

## 2017-07-24 ENCOUNTER — Ambulatory Visit: Payer: Medicare HMO | Admitting: Cardiovascular Disease

## 2017-07-24 VITALS — BP 140/70 | HR 97 | Ht 68.0 in | Wt 200.0 lb

## 2017-07-24 DIAGNOSIS — R9439 Abnormal result of other cardiovascular function study: Secondary | ICD-10-CM | POA: Diagnosis not present

## 2017-07-24 DIAGNOSIS — D649 Anemia, unspecified: Secondary | ICD-10-CM

## 2017-07-24 DIAGNOSIS — I10 Essential (primary) hypertension: Secondary | ICD-10-CM

## 2017-07-24 DIAGNOSIS — J209 Acute bronchitis, unspecified: Secondary | ICD-10-CM

## 2017-07-24 DIAGNOSIS — I4891 Unspecified atrial fibrillation: Secondary | ICD-10-CM

## 2017-07-24 DIAGNOSIS — I050 Rheumatic mitral stenosis: Secondary | ICD-10-CM

## 2017-07-24 DIAGNOSIS — I5032 Chronic diastolic (congestive) heart failure: Secondary | ICD-10-CM

## 2017-07-24 MED ORDER — POTASSIUM CHLORIDE CRYS ER 20 MEQ PO TBCR: 20.0000 meq | EXTENDED_RELEASE_TABLET | Freq: Every day | ORAL | 3 refills | Status: DC

## 2017-07-24 MED ORDER — PREDNISONE 5 MG PO TABS: 5.0000 mg | ORAL_TABLET | Freq: Every day | ORAL | 0 refills | Status: DC

## 2017-07-24 MED ORDER — DILTIAZEM HCL ER COATED BEADS 360 MG PO CP24: 360.0000 mg | ORAL_CAPSULE | Freq: Every day | ORAL | 3 refills | Status: DC

## 2017-07-24 NOTE — Progress Notes (Signed)
SUBJECTIVE: The patient presents for follow-up of atrial fibrillation and chronic diastolic heart failure.  She sees a new PCP, Dr. Morton Stall, and [place], [place].  The patient has been battling a lung infection with wheezing and coughing for the past week.  Her PCP apparently prescribed an antibiotic and a "blood thinner ".  She saw gastroenterology on June 18, 2017 and is scheduled for a colonoscopy on August 04, 2017.  She ran out of long-acting diltiazem 360 mg tablets and has been taking 240 mg daily.  She is fatigued.     Review of Systems: As per "subjective", otherwise negative.  Allergies  Allergen Reactions  . Penicillins Rash    Has patient had a PCN reaction causing immediate rash, facial/tongue/throat swelling, SOB or lightheadedness with hypotension: No Has patient had a PCN reaction causing severe rash involving mucus membranes or skin necrosis: No Has patient had a PCN reaction that required hospitalization: No Has patient had a PCN reaction occurring within the last 10 years: No If all of the above answers are "NO", then may proceed with Cephalosporin use.     Current Outpatient Medications  Medication Sig Dispense Refill  . albuterol (PROVENTIL HFA;VENTOLIN HFA) 108 (90 BASE) MCG/ACT inhaler Inhale into the lungs every 6 (six) hours as needed for wheezing or shortness of breath.    Marland Kitchen albuterol (PROVENTIL) (2.5 MG/3ML) 0.083% nebulizer solution Take 2.5 mg by nebulization every 6 (six) hours as needed for wheezing or shortness of breath.    . budesonide-formoterol (SYMBICORT) 160-4.5 MCG/ACT inhaler Inhale 2 puffs into the lungs 2 (two) times daily.    . calcium carbonate (OS-CAL) 600 MG TABS tablet Take 600 mg by mouth. Doesn't take everyday    . cholecalciferol (VITAMIN D) 1000 UNITS tablet Take 1,000 Units by mouth daily.    Marland Kitchen diltiazem (CARDIZEM CD) 360 MG 24 hr capsule Take 1 capsule (360 mg total) by mouth daily. 30 capsule 6  . esomeprazole (NEXIUM) 20  MG capsule Take 20 mg by mouth daily.     . furosemide (LASIX) 40 MG tablet Take 1 tablet (40 mg total) 2 (two) times daily by mouth. 60 tablet 6  . gabapentin (NEURONTIN) 400 MG capsule Take 400 mg by mouth at bedtime.    Marland Kitchen HYDROcodone-acetaminophen (NORCO/VICODIN) 5-325 MG tablet Take 1 tablet by mouth every 6 (six) hours as needed for moderate pain.    . hydrOXYzine (ATARAX/VISTARIL) 25 MG tablet Take 25 mg by mouth at bedtime.    Marland Kitchen MAGNESIUM PO Take by mouth. Twice a week    . montelukast (SINGULAIR) 10 MG tablet Take 10 mg by mouth at bedtime.    . Na Sulfate-K Sulfate-Mg Sulf 17.5-3.13-1.6 GM/177ML SOLN Take 1 kit by mouth as directed. 1 Bottle 0  . potassium chloride SA (K-DUR,KLOR-CON) 20 MEQ tablet Take 1 tablet (20 mEq total) daily by mouth. 30 tablet 6  . vitamin B-12 (CYANOCOBALAMIN) 1000 MCG tablet Take 1,000 mcg by mouth daily.    . vitamin C (ASCORBIC ACID) 500 MG tablet Take 500 mg by mouth daily.     No current facility-administered medications for this visit.     Past Medical History:  Diagnosis Date  . Anemia   . Arthritis   . Asthma   . Atrial fibrillation ([place])   . CHF (congestive heart failure) ([place])   . COPD (chronic obstructive pulmonary disease) ([place])   . History of nuclear stress test 2017   intermediate risk  . History  of shingles   . HOH (hard of hearing)   . Mitral stenosis   . Neuralgia   . On home O2    3L N/C    Past Surgical History:  Procedure Laterality Date  . ABDOMINAL HYSTERECTOMY    . CHOLECYSTECTOMY    . FASCIECTOMY Right 10/17/2014   Procedure: FASCIECTOMY RIGHT RING/SMALL FINGERS;  Surgeon: Daryll Brod, MD;  Location: [facility];  Service: Orthopedics;  Laterality: Right;  . KNEE ARTHROSCOPY     left  . TUBAL LIGATION    . TYMPANOSTOMY TUBE PLACEMENT     both ears    Social History   Socioeconomic History  . Marital status: Widowed    Spouse name: Not on file  . Number of children: Not on file  . Years of  education: Not on file  . Highest education level: Not on file  Social Needs  . Financial resource strain: Not on file  . Food insecurity - worry: Not on file  . Food insecurity - inability: Not on file  . Transportation needs - medical: Not on file  . Transportation needs - non-medical: Not on file  Occupational History  . Not on file  Tobacco Use  . Smoking status: Former Smoker    Last attempt to quit: 10/10/1986    Years since quitting: 30.8  . Smokeless tobacco: Never Used  Substance and Sexual Activity  . Alcohol use: No  . Drug use: No  . Sexual activity: Not on file  Other Topics Concern  . Not on file  Social History Narrative  . Not on file     Vitals:   07/24/17 1258  BP: 140/70  Pulse: 97  SpO2: 95%  Weight: 200 lb (90.7 kg)  Height: '5\' 8"'$  (1.727 m)    Wt Readings from Last 3 Encounters:  07/24/17 200 lb (90.7 kg)  06/18/17 201 lb 3.2 oz (91.3 kg)  06/08/17 197 lb (89.4 kg)     PHYSICAL EXAM General: NAD, mildly tachypneic HEENT: Normal. Neck: No JVD, no thyromegaly. Lungs: Diffuse expiratory wheezes, no crackles CV: Tachycardic, irregular rhythm, normal S1/S2, no S3, no murmur.  Trace pretibial edema.  Abdomen: Soft, nontender, no distention.  Neurologic: Alert and oriented.  Psych: Normal affect. Skin: Normal. Musculoskeletal: No gross deformities.    ECG: Most recent ECG reviewed.   Labs: Lab Results  Component Value Date/Time   K 3.5 05/15/2017 04:15 AM   BUN 14 05/15/2017 04:15 AM   CREATININE 1.11 (H) 05/15/2017 04:15 AM   HGB 9.9 (L) 05/14/2017 02:42 PM     Lipids: No results found for: LDLCALC, LDLDIRECT, CHOL, TRIG, HDL     ASSESSMENT AND PLAN: 1. Atrial fibrillation with rapid ventricular response: She has been only taking long-acting diltiazem 240 mg because she ran out of the 360 mg tablets.  I will refill these.  Her heart rate is likely elevated due to what appears to be acute bronchitis.  She cannot be safely  anticoagulated until the etiology of her anemia is evaluated and treated.    She is to undergo a colonoscopy on 08/04/17.  2.Chronic diastolic heart failure:Weight is stable.  I will not change the dose of Lasix.  I will control heart rate with long-acting diltiazem 360 mg daily.  This should help prevent frequent diastolic heart failure exacerbations with more optimal heart rate control.  Continue Lasix 40 mill grams twice daily.  I will refill supplemental potassium.  3. Mild to moderate mitral stenosis:  I will monitor this clinically and with surveillance echocardiography.  4. Anemia: She will need a GI workup before anticoagulation can be safely prescribed.Ihave already [facility] for upper endoscopy and colonoscopy.    She saw Dr. Oneida Alar in [place] on June 18, 2017 and is scheduled for a colonoscopy in August 04, 2017.  5.Hypertension: Blood pressure is mildly elevated.  I will monitor given increase in dose of diltiazem.  6. Abnormal stress test: Troponins were normal while hospitalized.  She has chest pains alleviated with belching and drinking Dr. Malachi Bonds, suggestive of a GI etiology. She may eventually need coronary angiography. I will monitor symptoms. Left ventricular systolic function is normal.  7.  Acute bronchitis: She is apparently already been prescribed an antibiotic by her PCP.  I will prescribe prednisone 5 mg daily for 5 days.   Disposition: Follow up 3 months   Kate Sable, M.D., F.A.C.C.

## 2017-07-24 NOTE — Patient Instructions (Addendum)
Medication Instructions:   Prednisone 5mg  daily x 5 days only.   Continue all other medications.    Labwork: none  Testing/Procedures: none  Follow-Up: 3 months   Any Other Special Instructions Will Be Listed Below (If Applicable).  If you need a refill on your cardiac medications before your next appointment, please call your pharmacy.

## 2017-07-27 ENCOUNTER — Telehealth: Payer: Self-pay | Admitting: Cardiovascular Disease

## 2017-07-27 NOTE — Telephone Encounter (Signed)
Stated that she did leave a message for her pmd as he was the one that prescribed that medication.   Dr. Morton Stall, but has not gotten to speak with a person, has left several messages.  Stated that she only took one dose of the antibiotic.  Does have 2 more of the Prednisone that Dr. Raliegh Ip prescribed.  Stated that she was home alone and it was bad weather, just sit on the side of her bed & prayed.  Questioned why she did not call 911 & stated that it was 2:00 in the morning - did not have the money / still owes the hospital money.  Explained to patient that it is not likely that the antibiotic was the cause of her symptoms after only one dose.  More concerning symptoms in light of her multiple cardiac issues.  Not due to f/u with [facility] till April.  Will send message to provider for any further suggestions.

## 2017-07-27 NOTE — Telephone Encounter (Signed)
She appeared to have acute bronchitis when I saw her, which is why I prescribed prednisone (due to her significant wheezing). I also do not feel it's the antibiotic that caused her symptoms. She should either make an appt to see her PCP or go to the ED if she is feeling worse. She should take 5 days of prednisone.

## 2017-07-27 NOTE — Telephone Encounter (Signed)
Left message to return call 

## 2017-07-27 NOTE — Telephone Encounter (Signed)
Having side effects from antibiotic that she is taking levofloxacin  500 mg a dailey for 10 days  Hot flashes, sweating, tingling, trouble breathing Saturday night. Could not even take breathes with out her left breast area with hurting.   She did not go to the emergency room due to weather.  She feels that the levofloxacin caused  She stopped the levofloxacin

## 2017-07-27 NOTE — Patient Instructions (Signed)
Amber Hull  07/27/2017     @PREFPERIOPPHARMACY @   Your procedure is scheduled on  08/04/2017   Report to [facility] at  46   A.M.  Call this number if you have problems the morning of surgery:  [phone]   Remember:  Do not eat food or drink liquids after midnight.  Take these medicines the morning of surgery with A SIP OF WATER  Diltizem, nexium, neurontin, hydrocodone, singulair. Use your inhalers and your nebulizer before you come.   Do not wear jewelry, make-up or nail polish.  Do not wear lotions, powders, or perfumes, or deodorant.  Do not shave 48 hours prior to surgery.  Men may shave face and neck.  Do not bring valuables to the hospital.  [facility] is not responsible for any belongings or valuables.  Contacts, dentures or bridgework may not be worn into surgery.  Leave your suitcase in the car.  After surgery it may be brought to your room.  For patients admitted to the hospital, discharge time will be determined by your treatment team.  Patients discharged the day of surgery will not be allowed to drive home.   Name and phone number of your driver:   family Special instructions:  Follow the diet and prep instructions given to you by Dr Nona Dell office.  Please read over the following fact sheets that you were given. Anesthesia Post-op Instructions and Care and Recovery After Surgery       Esophagogastroduodenoscopy Esophagogastroduodenoscopy (EGD) is a procedure to examine the lining of the esophagus, stomach, and first part of the small intestine (duodenum). This procedure is done to check for problems such as inflammation, bleeding, ulcers, or growths. During this procedure, a long, flexible, lighted tube with a camera attached (endoscope) is inserted down the throat. Tell a health care provider about:  Any allergies you have.  All medicines you are taking, including vitamins, herbs, eye drops, creams, and  over-the-counter medicines.  Any problems you or family members have had with anesthetic medicines.  Any blood disorders you have.  Any surgeries you have had.  Any medical conditions you have.  Whether you are pregnant or may be pregnant. What are the risks? Generally, this is a safe procedure. However, problems may occur, including:  Infection.  Bleeding.  A tear (perforation) in the esophagus, stomach, or duodenum.  Trouble breathing.  Excessive sweating.  Spasms of the larynx.  A slowed heartbeat.  Low blood pressure.  What happens before the procedure?  Follow instructions from your health care provider about eating or drinking restrictions.  Ask your health care provider about: ? Changing or stopping your regular medicines. This is especially important if you are taking diabetes medicines or blood thinners. ? Taking medicines such as aspirin and ibuprofen. These medicines can thin your blood. Do not take these medicines before your procedure if your health care provider instructs you not to.  Plan to have someone take you home after the procedure.  If you wear dentures, be ready to remove them before the procedure. What happens during the procedure?  To reduce your risk of infection, your health care team will wash or sanitize their hands.  An IV tube will be put in a vein in your hand or arm. You will get medicines and fluids through this tube.  You will be given one or more of the following: ? A medicine to help  you relax (sedative). ? A medicine to numb the area (local anesthetic). This medicine may be sprayed into your throat. It will make you feel more comfortable and keep you from gagging or coughing during the procedure. ? A medicine for pain.  A mouth guard may be placed in your mouth to protect your teeth and to keep you from biting on the endoscope.  You will be asked to lie on your left side.  The endoscope will be lowered down your throat into  your esophagus, stomach, and duodenum.  Air will be put into the endoscope. This will help your health care provider see better.  The lining of your esophagus, stomach, and duodenum will be examined.  Your health care provider may: ? Take a tissue sample so it can be looked at in a lab (biopsy). ? Remove growths. ? Remove objects (foreign bodies) that are stuck. ? Treat any bleeding with medicines or other devices that stop tissue from bleeding. ? Widen (dilate) or stretch narrowed areas of your esophagus and stomach.  The endoscope will be taken out. The procedure may vary among health care providers and hospitals. What happens after the procedure?  Your blood pressure, heart rate, breathing rate, and blood oxygen level will be monitored often until the medicines you were given have worn off.  Do not eat or drink anything until the numbing medicine has worn off and your gag reflex has returned. This information is not intended to replace advice given to you by your health care provider. Make sure you discuss any questions you have with your health care provider. Document Released: 10/31/2004 Document Revised: 12/06/2015 Document Reviewed: 05/24/2015 Elsevier Interactive Patient Education  2018 [employer]. Esophagogastroduodenoscopy, Care After Refer to this sheet in the next few weeks. These instructions provide you with information about caring for yourself after your procedure. Your health care provider may also give you more specific instructions. Your treatment has been planned according to current medical practices, but problems sometimes occur. Call your health care provider if you have any problems or questions after your procedure. What can I expect after the procedure? After the procedure, it is common to have:  A sore throat.  Nausea.  Bloating.  Dizziness.  Fatigue.  Follow these instructions at home:  Do not eat or drink anything until the numbing medicine  (local anesthetic) has worn off and your gag reflex has returned. You will know that the local anesthetic has worn off when you can swallow comfortably.  Do not drive for 24 hours if you received a medicine to help you relax (sedative).  If your health care provider took a tissue sample for testing during the procedure, make sure to get your test results. This is your responsibility. Ask your health care provider or the department performing the test when your results will be ready.  Keep all follow-up visits as told by your health care provider. This is important. Contact a health care provider if:  You cannot stop coughing.  You are not urinating.  You are urinating less than usual. Get help right away if:  You have trouble swallowing.  You cannot eat or drink.  You have throat or chest pain that gets worse.  You are dizzy or light-headed.  You faint.  You have nausea or vomiting.  You have chills.  You have a fever.  You have severe abdominal pain.  You have black, tarry, or bloody stools. This information is not intended to replace advice given to  you by your health care provider. Make sure you discuss any questions you have with your health care provider. Document Released: 06/16/2012 Document Revised: 12/06/2015 Document Reviewed: 05/24/2015 Elsevier Interactive Patient Education  2018 [employer].  Colonoscopy, Adult A colonoscopy is an exam to look at the large intestine. It is done to check for problems, such as:  Lumps (tumors).  Growths (polyps).  Swelling (inflammation).  Bleeding.  What happens before the procedure? Eating and drinking Follow instructions from your doctor about eating and drinking. These instructions may include:  A few days before the procedure - follow a low-fiber diet. ? Avoid nuts. ? Avoid seeds. ? Avoid dried fruit. ? Avoid raw fruits. ? Avoid vegetables.  1-3 days before the procedure - follow a clear liquid diet.  Avoid liquids that have red or purple dye. Drink only clear liquids, such as: ? Clear broth or bouillon. ? Black coffee or tea. ? Clear juice. ? Clear soft drinks or sports drinks. ? Gelatin dessert. ? Popsicles.  On the day of the procedure - do not eat or drink anything during the 2 hours before the procedure.  Bowel prep If you were prescribed an oral bowel prep:  Take it as told by your doctor. Starting the day before your procedure, you will need to drink a lot of liquid. The liquid will cause you to poop (have bowel movements) until your poop is almost clear or light green.  If your skin or butt gets irritated from diarrhea, you may: ? Wipe the area with wipes that have medicine in them, such as adult wet wipes with aloe and vitamin E. ? Put something on your skin that soothes the area, such as petroleum jelly.  If you throw up (vomit) while drinking the bowel prep, take a break for up to 60 minutes. Then begin the bowel prep again. If you keep throwing up and you cannot take the bowel prep without throwing up, call your doctor.  General instructions  Ask your doctor about changing or stopping your normal medicines. This is important if you take diabetes medicines or blood thinners.  Plan to have someone take you home from the hospital or clinic. What happens during the procedure?  An IV tube may be put into one of your veins.  You will be given medicine to help you relax (sedative).  To reduce your risk of infection: ? Your doctors will wash their hands. ? Your anal area will be washed with soap.  You will be asked to lie on your side with your knees bent.  Your doctor will get a long, thin, flexible tube ready. The tube will have a camera and a light on the end.  The tube will be put into your anus.  The tube will be gently put into your large intestine.  Air will be delivered into your large intestine to keep it open. You may feel some pressure or cramping.  The  camera will be used to take photos.  A small tissue sample may be removed from your body to be looked at under a microscope (biopsy). If any possible problems are found, the tissue will be sent to a lab for testing.  If small growths are found, your doctor may remove them and have them checked for cancer.  The tube that was put into your anus will be slowly removed. The procedure may vary among doctors and hospitals. What happens after the procedure?  Your doctor will check on you often until  the medicines you were given have worn off.  Do not drive for 24 hours after the procedure.  You may have a small amount of blood in your poop.  You may pass gas.  You may have mild cramps or bloating in your belly (abdomen).  It is up to you to get the results of your procedure. Ask your doctor, or the department performing the procedure, when your results will be ready. This information is not intended to replace advice given to you by your health care provider. Make sure you discuss any questions you have with your health care provider. Document Released: 08/02/2010 Document Revised: 04/30/2016 Document Reviewed: 09/11/2015 Elsevier Interactive Patient Education  2017 [employer]  Colonoscopy, Adult, Care After This sheet gives you information about how to care for yourself after your procedure. Your health care provider may also give you more specific instructions. If you have problems or questions, contact your health care provider. What can I expect after the procedure? After the procedure, it is common to have:  A small amount of blood in your stool for 24 hours after the procedure.  Some gas.  Mild abdominal cramping or bloating.  Follow these instructions at home: General instructions   For the first 24 hours after the procedure: ? Do not drive or use machinery. ? Do not sign important documents. ? Do not drink alcohol. ? Do your regular daily activities at a slower pace  than normal. ? Eat soft, easy-to-digest foods. ? Rest often.  Take over-the-counter or prescription medicines only as told by your health care provider.  It is up to you to get the results of your procedure. Ask your health care provider, or the department performing the procedure, when your results will be ready. Relieving cramping and bloating  Try walking around when you have cramps or feel bloated.  Apply heat to your abdomen as told by your health care provider. Use a heat source that your health care provider recommends, such as a moist heat pack or a heating pad. ? Place a towel between your skin and the heat source. ? Leave the heat on for 20-30 minutes. ? Remove the heat if your skin turns bright red. This is especially important if you are unable to feel pain, heat, or cold. You may have a greater risk of getting burned. Eating and drinking  Drink enough fluid to keep your urine clear or pale yellow.  Resume your normal diet as instructed by your health care provider. Avoid heavy or fried foods that are hard to digest.  Avoid drinking alcohol for as long as instructed by your health care provider. Contact a health care provider if:  You have blood in your stool 2-3 days after the procedure. Get help right away if:  You have more than a small spotting of blood in your stool.  You pass large blood clots in your stool.  Your abdomen is swollen.  You have nausea or vomiting.  You have a fever.  You have increasing abdominal pain that is not relieved with medicine. This information is not intended to replace advice given to you by your health care provider. Make sure you discuss any questions you have with your health care provider. Document Released: 02/12/2004 Document Revised: 03/24/2016 Document Reviewed: 09/11/2015 Elsevier Interactive Patient Education  2018 [employer] Anesthesia is a term that refers to techniques, procedures, and  medicines that help a person stay safe and comfortable during a medical procedure.  Monitored anesthesia care, or sedation, is one type of anesthesia. Your anesthesia specialist may recommend sedation if you will be having a procedure that does not require you to be unconscious, such as:  Cataract surgery.  A dental procedure.  A biopsy.  A colonoscopy.  During the procedure, you may receive a medicine to help you relax (sedative). There are three levels of sedation:  Mild sedation. At this level, you may feel awake and relaxed. You will be able to follow directions.  Moderate sedation. At this level, you will be sleepy. You may not remember the procedure.  Deep sedation. At this level, you will be asleep. You will not remember the procedure.  The more medicine you are given, the deeper your level of sedation will be. Depending on how you respond to the procedure, the anesthesia specialist may change your level of sedation or the type of anesthesia to fit your needs. An anesthesia specialist will monitor you closely during the procedure. Let your health care provider know about:  Any allergies you have.  All medicines you are taking, including vitamins, herbs, eye drops, creams, and over-the-counter medicines.  Any use of steroids (by mouth or as a cream).  Any problems you or family members have had with sedatives and anesthetic medicines.  Any blood disorders you have.  Any surgeries you have had.  Any medical conditions you have, such as sleep apnea.  Whether you are pregnant or may be pregnant.  Any use of cigarettes, alcohol, or street drugs. What are the risks? Generally, this is a safe procedure. However, problems may occur, including:  Getting too much medicine (oversedation).  Nausea.  Allergic reaction to medicines.  Trouble breathing. If this happens, a breathing tube may be used to help with breathing. It will be removed when you are awake and breathing on  your own.  Heart trouble.  Lung trouble.  Before the procedure Staying hydrated Follow instructions from your health care provider about hydration, which may include:  Up to 2 hours before the procedure - you may continue to drink clear liquids, such as water, clear fruit juice, black coffee, and plain tea.  Eating and drinking restrictions Follow instructions from your health care provider about eating and drinking, which may include:  8 hours before the procedure - stop eating heavy meals or foods such as meat, fried foods, or fatty foods.  6 hours before the procedure - stop eating light meals or foods, such as toast or cereal.  6 hours before the procedure - stop drinking milk or drinks that contain milk.  2 hours before the procedure - stop drinking clear liquids.  Medicines Ask your health care provider about:  Changing or stopping your regular medicines. This is especially important if you are taking diabetes medicines or blood thinners.  Taking medicines such as aspirin and ibuprofen. These medicines can thin your blood. Do not take these medicines before your procedure if your health care provider instructs you not to.  Tests and exams  You will have a physical exam.  You may have blood tests done to show: ? How well your kidneys and liver are working. ? How well your blood can clot.  General instructions  Plan to have someone take you home from the hospital or clinic.  If you will be going home right after the procedure, plan to have someone with you for 24 hours.  What happens during the procedure?  Your blood pressure, heart rate, breathing, level of pain  and overall condition will be monitored.  An IV tube will be inserted into one of your veins.  Your anesthesia specialist will give you medicines as needed to keep you comfortable during the procedure. This may mean changing the level of sedation.  The procedure will be performed. After the  procedure  Your blood pressure, heart rate, breathing rate, and blood oxygen level will be monitored until the medicines you were given have worn off.  Do not drive for 24 hours if you received a sedative.  You may: ? Feel sleepy, clumsy, or nauseous. ? Feel forgetful about what happened after the procedure. ? Have a sore throat if you had a breathing tube during the procedure. ? Vomit. This information is not intended to replace advice given to you by your health care provider. Make sure you discuss any questions you have with your health care provider. Document Released: 03/26/2005 Document Revised: 12/07/2015 Document Reviewed: 10/21/2015 Elsevier Interactive Patient Education  2018 [employer], Care After These instructions provide you with information about caring for yourself after your procedure. Your health care provider may also give you more specific instructions. Your treatment has been planned according to current medical practices, but problems sometimes occur. Call your health care provider if you have any problems or questions after your procedure. What can I expect after the procedure? After your procedure, it is common to:  Feel sleepy for several hours.  Feel clumsy and have poor balance for several hours.  Feel forgetful about what happened after the procedure.  Have poor judgment for several hours.  Feel nauseous or vomit.  Have a sore throat if you had a breathing tube during the procedure.  Follow these instructions at home: For at least 24 hours after the procedure:   Do not: ? Participate in activities in which you could fall or become injured. ? Drive. ? Use heavy machinery. ? Drink alcohol. ? Take sleeping pills or medicines that cause drowsiness. ? Make important decisions or sign legal documents. ? Take care of children on your own.  Rest. Eating and drinking  Follow the diet that is recommended by your health  care provider.  If you vomit, drink water, juice, or soup when you can drink without vomiting.  Make sure you have little or no nausea before eating solid foods. General instructions  Have a responsible adult stay with you until you are awake and alert.  Take over-the-counter and prescription medicines only as told by your health care provider.  If you smoke, do not smoke without supervision.  Keep all follow-up visits as told by your health care provider. This is important. Contact a health care provider if:  You keep feeling nauseous or you keep vomiting.  You feel light-headed.  You develop a rash.  You have a fever. Get help right away if:  You have trouble breathing. This information is not intended to replace advice given to you by your health care provider. Make sure you discuss any questions you have with your health care provider. Document Released: 10/21/2015 Document Revised: 02/20/2016 Document Reviewed: 10/21/2015 Elsevier Interactive Patient Education  [employer].

## 2017-07-28 ENCOUNTER — Other Ambulatory Visit (HOSPITAL_COMMUNITY): Payer: Medicare HMO

## 2017-07-28 ENCOUNTER — Inpatient Hospital Stay (HOSPITAL_COMMUNITY)
Admission: RE | Admit: 2017-07-28 | Discharge: 2017-07-28 | Disposition: A | Discharge status: Home / Self Care | Payer: Medicare HMO | Source: Ambulatory Visit

## 2017-07-28 NOTE — Telephone Encounter (Signed)
Patient notified and verbalized understanding. 

## 2017-07-29 ENCOUNTER — Other Ambulatory Visit: Payer: Self-pay

## 2017-07-29 ENCOUNTER — Encounter (HOSPITAL_COMMUNITY)
Admission: RE | Admit: 2017-07-29 | Discharge: 2017-07-29 | Disposition: A | Discharge status: Home / Self Care | Payer: Medicare HMO | Source: Ambulatory Visit | Attending: Gastroenterology | Admitting: Gastroenterology

## 2017-07-29 ENCOUNTER — Encounter (HOSPITAL_COMMUNITY): Payer: Self-pay

## 2017-07-29 DIAGNOSIS — Z01812 Encounter for preprocedural laboratory examination: Secondary | ICD-10-CM | POA: Diagnosis not present

## 2017-07-29 LAB — CBC WITH DIFFERENTIAL/PLATELET
Basophils Absolute: 0 10*3/uL (ref 0.0–0.1)
Basophils Relative: 0 %
EOS PCT: 9 %
Eosinophils Absolute: 0.8 10*3/uL — ABNORMAL HIGH (ref 0.0–0.7)
HEMATOCRIT: 30.8 % — AB (ref 36.0–46.0)
Hemoglobin: 9.3 g/dL — ABNORMAL LOW (ref 12.0–15.0)
LYMPHS ABS: 0.8 10*3/uL (ref 0.7–4.0)
Lymphocytes Relative: 9 %
MCH: 26.3 pg (ref 26.0–34.0)
MCHC: 30.2 g/dL (ref 30.0–36.0)
MCV: 87.3 fL (ref 78.0–100.0)
MONO ABS: 0.5 10*3/uL (ref 0.1–1.0)
Monocytes Relative: 5 %
Neutro Abs: 7.6 10*3/uL (ref 1.7–7.7)
Neutrophils Relative %: 77 %
PLATELETS: 362 10*3/uL (ref 150–400)
RBC: 3.53 MIL/uL — AB (ref 3.87–5.11)
RDW: 16.4 % — ABNORMAL HIGH (ref 11.5–15.5)
WBC: 9.8 10*3/uL (ref 4.0–10.5)

## 2017-07-29 LAB — BASIC METABOLIC PANEL
Anion gap: 8 (ref 5–15)
BUN: 14 mg/dL (ref 6–20)
CO2: 29 mmol/L (ref 22–32)
Calcium: 8.9 mg/dL (ref 8.9–10.3)
Chloride: 101 mmol/L (ref 101–111)
Creatinine, Ser: 1.12 mg/dL — ABNORMAL HIGH (ref 0.44–1.00)
GFR calc Af Amer: 51 mL/min — ABNORMAL LOW (ref >60)
GFR, EST NON AFRICAN AMERICAN: 44 mL/min — AB (ref >60)
GLUCOSE: 174 mg/dL — AB (ref 65–99)
POTASSIUM: 4 mmol/L (ref 3.5–5.1)
Sodium: 138 mmol/L (ref 135–145)

## 2017-07-30 NOTE — Progress Notes (Signed)
LMOM to call.

## 2017-07-30 NOTE — Progress Notes (Signed)
Pt is aware.  

## 2017-08-04 ENCOUNTER — Other Ambulatory Visit: Payer: Self-pay

## 2017-08-04 ENCOUNTER — Ambulatory Visit (HOSPITAL_COMMUNITY)
Admission: RE | Admit: 2017-08-04 | Discharge: 2017-08-04 | Disposition: A | Discharge status: Home / Self Care | Payer: Medicare HMO | Source: Ambulatory Visit | Attending: Gastroenterology | Admitting: Gastroenterology

## 2017-08-04 ENCOUNTER — Encounter (HOSPITAL_COMMUNITY): Payer: Self-pay

## 2017-08-04 ENCOUNTER — Ambulatory Visit (HOSPITAL_COMMUNITY): Payer: Medicare HMO | Admitting: Anesthesiology

## 2017-08-04 ENCOUNTER — Encounter (HOSPITAL_COMMUNITY)
Admission: RE | Disposition: A | Discharge status: Home / Self Care | Payer: Self-pay | Source: Ambulatory Visit | Attending: Gastroenterology

## 2017-08-04 DIAGNOSIS — K644 Residual hemorrhoidal skin tags: Secondary | ICD-10-CM | POA: Diagnosis not present

## 2017-08-04 DIAGNOSIS — Z79899 Other long term (current) drug therapy: Secondary | ICD-10-CM | POA: Insufficient documentation

## 2017-08-04 DIAGNOSIS — Z87891 Personal history of nicotine dependence: Secondary | ICD-10-CM | POA: Diagnosis not present

## 2017-08-04 DIAGNOSIS — I509 Heart failure, unspecified: Secondary | ICD-10-CM | POA: Insufficient documentation

## 2017-08-04 DIAGNOSIS — K3189 Other diseases of stomach and duodenum: Secondary | ICD-10-CM | POA: Diagnosis not present

## 2017-08-04 DIAGNOSIS — D122 Benign neoplasm of ascending colon: Secondary | ICD-10-CM | POA: Diagnosis not present

## 2017-08-04 DIAGNOSIS — D649 Anemia, unspecified: Secondary | ICD-10-CM

## 2017-08-04 DIAGNOSIS — I050 Rheumatic mitral stenosis: Secondary | ICD-10-CM | POA: Diagnosis not present

## 2017-08-04 DIAGNOSIS — Z8371 Family history of colonic polyps: Secondary | ICD-10-CM | POA: Diagnosis not present

## 2017-08-04 DIAGNOSIS — Q438 Other specified congenital malformations of intestine: Secondary | ICD-10-CM | POA: Diagnosis not present

## 2017-08-04 DIAGNOSIS — D124 Benign neoplasm of descending colon: Secondary | ICD-10-CM | POA: Diagnosis not present

## 2017-08-04 DIAGNOSIS — K648 Other hemorrhoids: Secondary | ICD-10-CM | POA: Diagnosis not present

## 2017-08-04 DIAGNOSIS — K449 Diaphragmatic hernia without obstruction or gangrene: Secondary | ICD-10-CM | POA: Insufficient documentation

## 2017-08-04 DIAGNOSIS — H9190 Unspecified hearing loss, unspecified ear: Secondary | ICD-10-CM | POA: Diagnosis not present

## 2017-08-04 DIAGNOSIS — K210 Gastro-esophageal reflux disease with esophagitis: Secondary | ICD-10-CM | POA: Diagnosis not present

## 2017-08-04 DIAGNOSIS — D123 Benign neoplasm of transverse colon: Secondary | ICD-10-CM | POA: Diagnosis not present

## 2017-08-04 DIAGNOSIS — K5730 Diverticulosis of large intestine without perforation or abscess without bleeding: Secondary | ICD-10-CM | POA: Insufficient documentation

## 2017-08-04 DIAGNOSIS — K317 Polyp of stomach and duodenum: Secondary | ICD-10-CM | POA: Insufficient documentation

## 2017-08-04 DIAGNOSIS — K31819 Angiodysplasia of stomach and duodenum without bleeding: Secondary | ICD-10-CM | POA: Diagnosis not present

## 2017-08-04 DIAGNOSIS — Z9981 Dependence on supplemental oxygen: Secondary | ICD-10-CM | POA: Insufficient documentation

## 2017-08-04 DIAGNOSIS — J449 Chronic obstructive pulmonary disease, unspecified: Secondary | ICD-10-CM | POA: Insufficient documentation

## 2017-08-04 DIAGNOSIS — Z7951 Long term (current) use of inhaled steroids: Secondary | ICD-10-CM | POA: Diagnosis not present

## 2017-08-04 HISTORY — PX: COLONOSCOPY WITH PROPOFOL: SHX5780

## 2017-08-04 HISTORY — PX: POLYPECTOMY: SHX5525

## 2017-08-04 HISTORY — PX: BIOPSY: SHX5522

## 2017-08-04 HISTORY — PX: ESOPHAGOGASTRODUODENOSCOPY (EGD) WITH PROPOFOL: SHX5813

## 2017-08-04 SURGERY — COLONOSCOPY WITH PROPOFOL
Anesthesia: Monitor Anesthesia Care

## 2017-08-04 MED ORDER — FENTANYL CITRATE (PF) 100 MCG/2ML IJ SOLN
INTRAMUSCULAR | Status: AC
  Filled 2017-08-04: qty 2

## 2017-08-04 MED ORDER — PROPOFOL 10 MG/ML IV BOLUS
INTRAVENOUS | Status: AC
  Filled 2017-08-04: qty 20

## 2017-08-04 MED ORDER — FENTANYL CITRATE (PF) 100 MCG/2ML IJ SOLN
INTRAMUSCULAR | Status: DC | PRN
  Administered 2017-08-04: 25 ug via INTRAVENOUS

## 2017-08-04 MED ORDER — LIDOCAINE VISCOUS 2 % MT SOLN
OROMUCOSAL | Status: AC
  Filled 2017-08-04: qty 15

## 2017-08-04 MED ORDER — CHLORHEXIDINE GLUCONATE CLOTH 2 % EX PADS: 6.0000 | MEDICATED_PAD | Freq: Once | CUTANEOUS | Status: DC

## 2017-08-04 MED ORDER — LACTATED RINGERS IV SOLN
INTRAVENOUS | Status: DC
  Administered 2017-08-04: 1000 mL via INTRAVENOUS

## 2017-08-04 MED ORDER — MIDAZOLAM HCL 2 MG/2ML IJ SOLN
INTRAMUSCULAR | Status: AC
  Filled 2017-08-04: qty 2

## 2017-08-04 MED ORDER — FENTANYL CITRATE (PF) 100 MCG/2ML IJ SOLN
25.0000 ug | Freq: Once | INTRAMUSCULAR | Status: AC
  Administered 2017-08-04: 25 ug via INTRAVENOUS

## 2017-08-04 MED ORDER — PROPOFOL 500 MG/50ML IV EMUL
INTRAVENOUS | Status: DC | PRN
Start: 2017-08-04 — End: 2017-08-04
  Administered 2017-08-04: 100 ug/kg/min via INTRAVENOUS

## 2017-08-04 MED ORDER — LIDOCAINE VISCOUS 2 % MT SOLN
15.0000 mL | Freq: Once | OROMUCOSAL | Status: AC
  Administered 2017-08-04: 15 mL via OROMUCOSAL

## 2017-08-04 MED ORDER — ESOMEPRAZOLE MAGNESIUM 40 MG PO CPDR: DELAYED_RELEASE_CAPSULE | ORAL | 11 refills | Status: DC

## 2017-08-04 MED ORDER — MIDAZOLAM HCL 2 MG/2ML IJ SOLN
1.0000 mg | INTRAMUSCULAR | Status: AC
  Administered 2017-08-04: 2 mg via INTRAVENOUS

## 2017-08-04 NOTE — Op Note (Signed)
[facility] Patient Name: Amber Hull Procedure Date: 08/04/2017 8:15 AM MRN: [id number] Date of Birth: [birth year] Attending MD: Barney Drain MD, MD CSN: [id number] Age: 84 Admit Type: Outpatient Procedure:                Colonoscopy WITH COLD SNARE/FORCEPS AND SNARE                            POLYPECTOMY Indications:              Anemia Providers:                Barney Drain MD, MD, Otis Peak B. Sharon Seller, RN,                            Randa Spike, Technician Referring MD:             Memory Argue. Waters Medicines:                Propofol per Anesthesia Complications:            No immediate complications. Estimated Blood Loss:     Estimated blood loss was minimal. Procedure:                Pre-Anesthesia Assessment:                           - Prior to the procedure, a History and Physical                            was performed, and patient medications and                            allergies were reviewed. The patient's tolerance of                            previous anesthesia was also reviewed. The risks                            and benefits of the procedure and the sedation                            options and risks were discussed with the patient.                            All questions were answered, and informed consent                            was obtained. Prior Anticoagulants: The patient has                            taken no previous anticoagulant or antiplatelet                            agents. ASA Grade Assessment: II - A patient with  mild systemic disease. After reviewing the risks                            and benefits, the patient was deemed in                            satisfactory condition to undergo the procedure.                            After obtaining informed consent, the colonoscope                            was passed under direct vision. Throughout the                            procedure, the patient's  blood pressure, pulse, and                            oxygen saturations were monitored continuously. The                            [id number] ([id number]) scope was introduced through                            the anus and advanced to the 10 cm into the ileum.                            The colonoscopy was technically difficult and                            complex due to restricted mobility of the colon and                            significant looping. Successful completion of the                            procedure was aided by straightening and shortening                            the scope to obtain bowel loop reduction and                            COLOWRAP. The patient tolerated the procedure well.                            The quality of the bowel preparation was good. The                            terminal ileum, ileocecal valve, appendiceal                            orifice, and rectum were photographed. Scope In: 8:43:59 AM Scope Out: 9:22:51 AM Scope Withdrawal Time: 0 hours 29 minutes  19 seconds  Total Procedure Duration: 0 hours 38 minutes 52 seconds  Findings:      The terminal ileum appeared normal.      Two sessile polyps were found in the hepatic flexure and ascending       colon. The polyps were 4 to 6 mm in size. These polyps were removed with       a cold snare. Resection and retrieval were complete.      A 3 mm polyp was found in the proximal descending colon. The polyp was       sessile. The polyp was removed with a cold biopsy forceps. Resection and       retrieval were complete.      A 8 mm polyp was found in the mid descending colon. The polyp was       sessile. The polyp was removed with a hot snare. Resection and retrieval       were complete.      Multiple small and large-mouthed diverticula were found in the       recto-sigmoid colon, sigmoid colon and descending colon.      The recto-sigmoid colon, sigmoid colon and descending colon were        significantly redundant.      External and internal hemorrhoids were found during retroflexion. The       hemorrhoids were small. Impression:               - The examined portion of the ileum was normal.                           - Two 4 to 6 mm polyps at the hepatic flexure and                            in the ascending colon, removed with a cold snare.                            Resected and retrieved.                           - One 3 mm polyp in the proximal descending colon,                            removed with a cold biopsy forceps. Resected and                            retrieved.                           - One 8 mm polyp in the mid descending colon,                            removed with a hot snare. Resected and retrieved.                           - Diverticulosis in the recto-sigmoid colon, in the                            sigmoid colon  and in the descending colon.                           - Redundant LEFT colon.                           - External and internal hemorrhoids. Moderate Sedation:      Per Anesthesia Care Recommendation:           - Resume previous diet.                           - Continue present medications.                           - Await pathology results.                           - Return to my office in 4 months.                           - Patient has a contact number available for                            emergencies. The signs and symptoms of potential                            delayed complications were discussed with the                            patient. Return to normal activities tomorrow.                            Written discharge instructions were provided to the                            patient.                           - No repeat colonoscopy due to age. Procedure Code(s):        --- Professional ---                           [phone], Colonoscopy, flexible; with removal of                            tumor(s), polyp(s), or  other lesion(s) by snare                            technique                           09381, 8, Colonoscopy, flexible; with biopsy,                            single or multiple Diagnosis Code(s):        --- Professional ---  K64.8, Other hemorrhoids                           D12.3, Benign neoplasm of transverse colon (hepatic                            flexure or splenic flexure)                           D12.2, Benign neoplasm of ascending colon                           D12.4, Benign neoplasm of descending colon                           D64.9, Anemia, unspecified                           K57.30, Diverticulosis of large intestine without                            perforation or abscess without bleeding                           Q43.8, Other specified congenital malformations of                            intestine CPT copyright 2016 American [employer]. All rights reserved. The codes documented in this report are preliminary and upon coder review may  be revised to meet current compliance requirements. Barney Drain, MD Barney Drain MD, MD 08/04/2017 9:59:59 AM This report has been signed electronically. Number of Addenda: 0

## 2017-08-04 NOTE — H&P (Addendum)
Primary Care Physician:  Terrial Rhodes, MD Primary Gastroenterologist:  Dr. Oneida Alar  Pre-Procedure History & Physical: HPI:  Amber Hull is a 84 y.o. female here for Anemia, normocytic.  Past Medical History:  Diagnosis Date  . Anemia   . Arthritis   . Asthma   . Atrial fibrillation ([place])   . CHF (congestive heart failure) ([place])   . COPD (chronic obstructive pulmonary disease) ([place])   . History of nuclear stress test 2017   intermediate risk  . History of shingles   . HOH (hard of hearing)   . Mitral stenosis   . Neuralgia   . On home O2    3L N/C    Past Surgical History:  Procedure Laterality Date  . ABDOMINAL HYSTERECTOMY    . CHOLECYSTECTOMY    . FASCIECTOMY Right 10/17/2014   Procedure: FASCIECTOMY RIGHT RING/SMALL FINGERS;  Surgeon: Daryll Brod, MD;  Location: [facility];  Service: Orthopedics;  Laterality: Right;  . KNEE ARTHROSCOPY     left  . TUBAL LIGATION    . TYMPANOSTOMY TUBE PLACEMENT     both ears    Prior to Admission medications   Medication Sig Start Date End Date Taking? Authorizing Provider  albuterol (PROVENTIL HFA;VENTOLIN HFA) 108 (90 BASE) MCG/ACT inhaler Inhale into the lungs every 6 (six) hours as needed for wheezing or shortness of breath.   Yes [provider]  albuterol (PROVENTIL) (2.5 MG/3ML) 0.083% nebulizer solution Take 2.5 mg by nebulization every 6 (six) hours as needed for wheezing or shortness of breath.   Yes [provider]  budesonide-formoterol (SYMBICORT) 160-4.5 MCG/ACT inhaler Inhale 2 puffs into the lungs 2 (two) times daily.   Yes [provider]  calcium carbonate (OS-CAL) 600 MG TABS tablet Take 600 mg by mouth. Doesn't take everyday   Yes [provider]  cholecalciferol (VITAMIN D) 1000 UNITS tablet Take 1,000 Units by mouth daily.   Yes [provider]  diltiazem (CARDIZEM CD) 360 MG 24 hr capsule Take 1 capsule (360 mg total) by mouth daily. 07/24/17  08/23/17 Yes Herminio Commons, MD  esomeprazole (NEXIUM) 20 MG capsule Take 20 mg by mouth daily.    Yes [provider]  furosemide (LASIX) 40 MG tablet Take 1 tablet (40 mg total) 2 (two) times daily by mouth. 05/18/17  Yes Herminio Commons, MD  gabapentin (NEURONTIN) 400 MG capsule Take 400 mg by mouth at bedtime.   Yes [provider]  HYDROcodone-acetaminophen (NORCO/VICODIN) 5-325 MG tablet Take 1 tablet by mouth every 6 (six) hours as needed for moderate pain.   Yes [provider]  hydrOXYzine (ATARAX/VISTARIL) 25 MG tablet Take 25 mg by mouth at bedtime.   Yes [provider]  MAGNESIUM PO Take by mouth. Twice a week   Yes [provider]  montelukast (SINGULAIR) 10 MG tablet Take 10 mg by mouth at bedtime.   Yes [provider]  Na Sulfate-K Sulfate-Mg Sulf 17.5-3.13-1.6 GM/177ML SOLN Take 1 kit by mouth as directed. 06/18/17  Yes Fields, Sandi L, MD  potassium chloride SA (K-DUR,KLOR-CON) 20 MEQ tablet Take 1 tablet (20 mEq total) by mouth daily. 07/24/17  Yes Herminio Commons, MD  vitamin B-12 (CYANOCOBALAMIN) 1000 MCG tablet Take 1,000 mcg by mouth daily.   Yes [provider]  vitamin C (ASCORBIC ACID) 500 MG tablet Take 500 mg by mouth daily.   Yes [provider]  predniSONE (DELTASONE) 5 MG tablet Take 1 tablet (  5 mg total) by mouth daily with breakfast. Patient not taking: Reported on 07/29/2017 07/24/17   Herminio Commons, MD    Allergies as of 06/18/2017 - Review Complete 06/18/2017  Allergen Reaction Noted  . Penicillins Rash 09/14/2008    Family History  Problem Relation Age of Onset  . Kidney cancer Mother   . Colon polyps Sister   . Colon cancer Neg Hx     Social History   Socioeconomic History  . Marital status: Widowed    Spouse name: Not on file  . Number of children: Not on file  . Years of education: Not on file  . Highest education level: Not on file  Social Needs  .  Financial resource strain: Not on file  . Food insecurity - worry: Not on file  . Food insecurity - inability: Not on file  . Transportation needs - medical: Not on file  . Transportation needs - non-medical: Not on file  Occupational History  . Not on file  Tobacco Use  . Smoking status: Former Smoker    Packs/day: 0.25    Years: 10.00    Pack years: 2.50    Types: Cigarettes    Last attempt to quit: 10/10/1986    Years since quitting: 30.8  . Smokeless tobacco: Never Used  Substance and Sexual Activity  . Alcohol use: No  . Drug use: No  . Sexual activity: Not Currently    Birth control/protection: Surgical  Other Topics Concern  . Not on file  Social History Narrative  . Not on file    Review of Systems: See HPI, otherwise negative ROS   Physical Exam: BP (!) 121/54   Pulse 98   Temp 97.8 F (36.6 C) (Oral)   Resp 17   Ht _0  (1.727 m)   Wt 200 lb (90.7 kg)   SpO2 100%   BMI 30.41 kg/m  General:   Alert,  pleasant and cooperative in NAD Head:  Normocephalic and atraumatic. Neck:  Supple; Lungs:  Clear throughout to auscultation.    Heart:  Regular rate and irregular rhythm. Abdomen:  Soft, nontender and nondistended. Normal bowel sounds, without guarding, and without rebound.   Neurologic:  Alert and  oriented x4;  grossly normal neurologically.  Impression/Plan:     Anemia, normocytic  PLAN:  1. TCS/EGD TODAY DISCUSSED PROCEDURE, BENEFITS, & RISKS: < 1% chance of medication reaction, bleeding, perforation, or rupture of spleen/liver.

## 2017-08-04 NOTE — Transfer of Care (Signed)
Immediate Anesthesia Transfer of Care Note  Patient: Amber Hull  Procedure(s) Performed: COLONOSCOPY WITH PROPOFOL (N/A ) ESOPHAGOGASTRODUODENOSCOPY (EGD) WITH PROPOFOL (N/A ) POLYPECTOMY BIOPSY  Patient Location: PACU  Anesthesia Type:MAC  Level of Consciousness: awake, alert  and oriented  Airway & Oxygen Therapy: Patient Spontanous Breathing and Patient connected to nasal cannula oxygen  Post-op Assessment: Report given to RN and Post -op Vital signs reviewed and stable  Post vital signs: Reviewed and stable  Last Vitals:  Vitals:   08/04/17 0800 08/04/17 0805  BP: (!) 114/53 (!) 121/54  Pulse:    Resp:    Temp:    SpO2:      Last Pain:  Vitals:   08/04/17 0724  TempSrc: Oral      Patients Stated Pain Goal: 5 (15/52/08 0223)  Complications: No apparent anesthesia complications

## 2017-08-04 NOTE — Anesthesia Preprocedure Evaluation (Signed)
Anesthesia Evaluation  Patient identified by MRN, date of birth, ID band Patient awake    Reviewed: Allergy & Precautions, NPO status , Patient's Chart, lab work & pertinent test results  Airway Mallampati: I  TM Distance: >3 FB Neck ROM: Full    Dental  (+) Teeth Intact   Pulmonary shortness of breath, asthma , COPD,  COPD inhaler, former smoker,    breath sounds clear to auscultation       Cardiovascular +CHF and + DOE  + dysrhythmias Atrial Fibrillation  Rhythm:Regular Rate:Normal     Neuro/Psych    GI/Hepatic GERD  Medicated,  Endo/Other    Renal/GU      Musculoskeletal  (+) Arthritis ,   Abdominal   Peds  Hematology  (+) anemia ,   Anesthesia Other Findings   Reproductive/Obstetrics                             Anesthesia Physical Anesthesia Plan  ASA: III  Anesthesia Plan: MAC   Post-op Pain Management:    Induction:   PONV Risk Score and Plan:   Airway Management Planned: Simple Face Mask  Additional Equipment:   Intra-op Plan:   Post-operative Plan:   Informed Consent: I have reviewed the patients History and Physical, chart, labs and discussed the procedure including the risks, benefits and alternatives for the proposed anesthesia with the patient or authorized representative who has indicated his/her understanding and acceptance.     Plan Discussed with:   Anesthesia Plan Comments:         Anesthesia Quick Evaluation

## 2017-08-04 NOTE — Anesthesia Postprocedure Evaluation (Signed)
Anesthesia Post Note  Patient: Amber Hull  Procedure(s) Performed: COLONOSCOPY WITH PROPOFOL (N/A ) ESOPHAGOGASTRODUODENOSCOPY (EGD) WITH PROPOFOL (N/A ) POLYPECTOMY BIOPSY  Patient location during evaluation: Short Stay Anesthesia Type: MAC Level of consciousness: awake and alert Pain management: satisfactory to patient Vital Signs Assessment: post-procedure vital signs reviewed and stable Respiratory status: spontaneous breathing Cardiovascular status: stable Postop Assessment: no apparent nausea or vomiting Anesthetic complications: no     Last Vitals:  Vitals:   08/04/17 1015 08/04/17 1017  BP: 120/62 (!) 122/55  Pulse: 72 79  Resp: 15 18  Temp:  36.6 C  SpO2: 100% 100%    Last Pain:  Vitals:   08/04/17 1017  TempSrc: Oral  PainSc: 0-No pain                 Tanza Pellot

## 2017-08-04 NOTE — Op Note (Signed)
[facility] Patient Name: Amber Hull Procedure Date: 08/04/2017 9:26 AM MRN: [id number] Date of Birth: 04-Jan-1932 Attending MD: Barney Drain MD, MD CSN: [id number] Age: 84 Admit Type: Outpatient Procedure:                Upper GI endoscopy WITH COLD FORCEPS BIOPSY Indications:              Anemia-NORMOCYTIC Providers:                Barney Drain MD, MD, Gwenlyn Fudge RN, RN, Randa Spike, Technician Referring MD:             Memory Argue. Waters, MD Medicines:                Propofol per Anesthesia Complications:            No immediate complications. Estimated Blood Loss:     Estimated blood loss was minimal. Procedure:                Pre-Anesthesia Assessment:                           - Prior to the procedure, a History and Physical                            was performed, and patient medications and                            allergies were reviewed. The patient's tolerance of                            previous anesthesia was also reviewed. The risks                            and benefits of the procedure and the sedation                            options and risks were discussed with the patient.                            All questions were answered, and informed consent                            was obtained. Prior Anticoagulants: The patient has                            taken no previous anticoagulant or antiplatelet                            agents. ASA Grade Assessment: II - A patient with                            mild systemic disease. After reviewing the risks  and benefits, the patient was deemed in                            satisfactory condition to undergo the procedure.                            After obtaining informed consent, the endoscope was                            passed under direct vision. Throughout the                            procedure, the patient's blood pressure, pulse, and                          oxygen saturations were monitored continuously. The                            [id number] ([id number]) scope was introduced through the                            mouth, and advanced to the second part of duodenum.                            The upper GI endoscopy was accomplished without                            difficulty. The patient tolerated the procedure                            well. Scope In: 9:31:28 AM Scope Out: 9:41:35 AM Total Procedure Duration: 0 hours 10 minutes 7 seconds  Findings:      LA Grade B (one or more mucosal breaks greater than 5 mm, not extending       between the tops of two mucosal folds) esophagitis with no bleeding was       found.      A medium-sized hiatal hernia was present.      A few small sessile polyps with no stigmata of recent bleeding were       found in the gastric body. The polyp was removed with a cold biopsy       forceps. Resection and retrieval were complete.      Diffuse atrophic mucosa was found in the gastric body and in the gastric       antrum. Biopsies were taken with a cold forceps for histology.      The examined duodenum was normal. Biopsies for histology were taken with       a cold forceps for evaluation of celiac disease.      A single small angioectasia without bleeding was found in the second       portion of the duodenum. Impression:               - LA Grade B reflux esophagitis.                           - Medium-sized hiatal hernia.                           -  A few gastric polyps. Resected and retrieved.                           - Gastric mucosal atrophy. Biopsied.                           - NO OBVIOUS SOURCE FOR ANEMIA IDENTIFIED EXCEPT                            DUODENAL AVM. Moderate Sedation:      Per Anesthesia Care Recommendation:           - Await pathology results. IF NO SOURCE FOR ANEMIA                            IDENTIFIED, PT NEEDS A GIVENS CAPSULE.                           - Resume  previous diet.                           - Continue present medications.                           - Return to my office in 4 months.                           - Patient has a contact number available for                            emergencies. The signs and symptoms of potential                            delayed complications were discussed with the                            patient. Return to normal activities tomorrow.                            Written discharge instructions were provided to the                            patient. Procedure Code(s):        --- Professional ---                           [phone], Esophagogastroduodenoscopy, flexible,                            transoral; with biopsy, single or multiple Diagnosis Code(s):        --- Professional ---                           K21.0, Gastro-esophageal reflux disease with                            esophagitis  K44.9, Diaphragmatic hernia without obstruction or                            gangrene                           K31.7, Polyp of stomach and duodenum                           K31.89, Other diseases of stomach and duodenum                           D64.9, Anemia, unspecified CPT copyright 2016 American Medical Association. All rights reserved. The codes documented in this report are preliminary and upon coder review may  be revised to meet current compliance requirements. Barney Drain, MD Barney Drain MD, MD 08/04/2017 10:05:40 AM This report has been signed electronically. Number of Addenda: 0

## 2017-08-10 ENCOUNTER — Encounter (HOSPITAL_COMMUNITY): Payer: Self-pay | Admitting: Gastroenterology

## 2017-08-11 NOTE — Discharge Instructions (Signed)
You have MODERATE EXTERNAL AND internal hemorrhoids, and HAD 4 polyps removed. You have SMALL BOWEL arteriovenous malformation, WHICH ARE RED SPOTS IN YOURSMALLBOWEL THAT CAN BLED AND KEEP YOUR BLOOD COUNT LOW. YOU HAVE DIVERTICULOSIS IN YOUR LEFT COLON. I biopsied your stomach AND SMALL BOWEL.   DRINK WATER TO KEEP YOUR URINE LIGHT YELLOW.  FOLLOW A HIGH FIBER/LOW FAT DIET. AVOID ITEMS THAT CAUSE BLOATING. SEE INFO BELOW.  YOUR BIOPSY RESULTS WILL BE AVAILABLE IN MY CHART AFTER JAN 26 AND MY OFFICE WILL CONTACT YOU IN 10-14 DAYS WITH YOUR RESULTS.   Continue NEXIUM. INCREASE TO 40 MG 30 MINUTES PRIOR TO BREAKFAST DAILY.  REPEAT CBC IN 1 MOS.  FOLLOW UP IN 4 MOS.   YOU WILL NEED A GIVENS CAPSULE STUDY IF NO ADDITIONAL SOURCE FOR BLOOD LOSS OR LOW BLOOD COUNT IS IDENTIFIED ON BIOPSY.   ENDOSCOPY Care After Read the instructions outlined below and refer to this sheet in the next week. These discharge instructions provide you with general information on caring for yourself after you leave the hospital. While your treatment has been planned according to the most current medical practices available, unavoidable complications occasionally occur. If you have any problems or questions after discharge, call DR. Aalivia Mcgraw, [phone].  ACTIVITY  You may resume your regular activity, but move at a slower pace for the next 24 hours.   Take frequent rest periods for the next 24 hours.   Walking will help get rid of the air and reduce the bloated feeling in your belly (abdomen).   No driving for 24 hours (because of the medicine (anesthesia) used during the test).   You may shower.   Do not sign any important legal documents or operate any machinery for 24 hours (because of the anesthesia used during the test).    NUTRITION  Drink plenty of fluids.   You may resume your normal diet as instructed by your doctor.   Begin with a light meal and progress to your normal diet. Heavy or fried  foods are harder to digest and may make you feel sick to your stomach (nauseated).   Avoid alcoholic beverages for 24 hours or as instructed.    MEDICATIONS  You may resume your normal medications.   WHAT YOU CAN EXPECT TODAY  Some feelings of bloating in the abdomen.   Passage of more gas than usual.   Spotting of blood in your stool or on the toilet paper  .  IF YOU HAD POLYPS REMOVED DURING THE ENDOSCOPY:  Eat a soft diet IF YOU HAVE NAUSEA, BLOATING, ABDOMINAL PAIN, OR VOMITING.    FINDING OUT THE RESULTS OF YOUR TEST Not all test results are available during your visit. DR. Oneida Alar WILL CALL YOU WITHIN 14 DAYS OF YOUR PROCEDUE WITH YOUR RESULTS. Do not assume everything is normal if you have not heard from DR. Andra Matsuo, CALL HER OFFICE AT [phone].  SEEK IMMEDIATE MEDICAL ATTENTION AND CALL THE OFFICE: [phone] IF:  You have more than a spotting of blood in your stool.   Your belly is swollen (abdominal distention).   You are nauseated or vomiting.   You have a temperature over 101F.   You have abdominal pain or discomfort that is severe or gets worse throughout the day.  Arteriovenous Malformation An arteriovenous malformation (AVM) is a disorder that CAUSE BLOOD VESSELS TO BE ON THE SURFACE OF YOUR GI TRACT. It is characterized by a complex, tangled web of arteries and veins THA HAVE A TENDENCY  TO BLEED. An AVM may occur in the STOMACH, COLON, OR SMALL BOWEL.  SYMPTOMS  The most common problems (symptoms) of AVM include:  Bleeding (hemorrhaging).   ANEMIA  TREATMENT  The treatment for AVMs: **ABLATION WITH HEAT   Polyps, Colon  A polyp is extra tissue that grows inside your body. Colon polyps grow in the large intestine. The large intestine, also called the colon, is part of your digestive system. It is a long, hollow tube at the end of your digestive tract where your body makes and stores stool. Most polyps are not dangerous. They are benign. This  means they are not cancerous. But over time, some types of polyps can turn into cancer. Polyps that are smaller than a pea are usually not harmful. But larger polyps could someday become or may already be cancerous. To be safe, doctors remove all polyps and test them.     PREVENTION There is not one sure way to prevent polyps. You might be able to lower your risk of getting them if you:  Eat more fruits and vegetables and less fatty food.   Do not smoke.   Avoid alcohol.   Exercise every day.   Lose weight if you are overweight.   Eating more calcium and folate can also lower your risk of getting polyps. Some foods that are rich in calcium are milk, cheese, and broccoli. Some foods that are rich in folate are chickpeas, kidney beans, and spinach.    Gastritis  Gastritis is an inflammation (the body's way of reacting to injury and/or infection) of the stomach. It is often caused by viral or bacterial (germ) infections. It can also be caused BY ASPIRIN, BC/GOODY POWDER'S, (IBUPROFEN) MOTRIN, OR ALEVE (NAPROXEN), chemicals (including alcohol), SPICY FOODS, and medications. This illness may be associated with generalized malaise (feeling tired, not well), UPPER ABDOMINAL STOMACH cramps, and fever. One common bacterial cause of gastritis is an organism known as H. Pylori. This can be treated with antibiotics.    High-Fiber Diet A high-fiber diet changes your normal diet to include more whole grains, legumes, fruits, and vegetables. Changes in the diet involve replacing refined carbohydrates with unrefined foods. The calorie level of the diet is essentially unchanged. The Dietary Reference Intake (recommended amount) for adult males is 38 grams per day. For adult females, it is 25 grams per day. Pregnant and lactating women should consume 28 grams of fiber per day. Fiber is the intact part of a plant that is not broken down during digestion. Functional fiber is fiber that has been isolated from  the plant to provide a beneficial effect in the body. PURPOSE  Increase stool bulk.   Ease and regulate bowel movements.   Lower cholesterol.   REDUCE RISK OF COLON CANCER  INDICATIONS THAT YOU NEED MORE FIBER  Constipation and hemorrhoids.   Uncomplicated diverticulosis (intestine condition) and irritable bowel syndrome.   Weight management.   As a protective measure against hardening of the arteries (atherosclerosis), diabetes, and cancer.   GUIDELINES FOR INCREASING FIBER IN THE DIET  Start adding fiber to the diet slowly. A gradual increase of about 5 more grams (2 slices of whole-wheat bread, 2 servings of most fruits or vegetables, or 1 bowl of high-fiber cereal) per day is best. Too rapid an increase in fiber may result in constipation, flatulence, and bloating.   Drink enough water and fluids to keep your urine clear or pale yellow. Water, juice, or caffeine-free drinks are recommended. Not drinking enough  fluid may cause constipation.   Eat a variety of high-fiber foods rather than one type of fiber.   Try to increase your intake of fiber through using high-fiber foods rather than fiber pills or supplements that contain small amounts of fiber.   The goal is to change the types of food eaten. Do not supplement your present diet with high-fiber foods, but replace foods in your present diet.   INCLUDE A VARIETY OF FIBER SOURCES  Replace refined and processed grains with whole grains, canned fruits with fresh fruits, and incorporate other fiber sources. White rice, white breads, and most bakery goods contain little or no fiber.   Brown whole-grain rice, buckwheat oats, and many fruits and vegetables are all good sources of fiber. These include: broccoli, Brussels sprouts, cabbage, cauliflower, beets, sweet potatoes, white potatoes (skin on), carrots, tomatoes, eggplant, squash, berries, fresh fruits, and dried fruits.   Cereals appear to be the richest source of fiber.  Cereal fiber is found in whole grains and bran. Bran is the fiber-rich outer coat of cereal grain, which is largely removed in refining. In whole-grain cereals, the bran remains. In breakfast cereals, the largest amount of fiber is found in those with "bran" in their names. The fiber content is sometimes indicated on the label.   You may need to include additional fruits and vegetables each day.   In baking, for 1 cup white flour, you may use the following substitutions:   1 cup whole-wheat flour minus 2 tablespoons.   1/2 cup white flour plus 1/2 cup whole-wheat flour.   Low-Fat Diet BREADS, CEREALS, PASTA, RICE, DRIED PEAS, AND BEANS These products are high in carbohydrates and most are low in fat. Therefore, they can be increased in the diet as substitutes for fatty foods. They too, however, contain calories and should not be eaten in excess. Cereals can be eaten for snacks as well as for breakfast.  Include foods that contain fiber (fruits, vegetables, whole grains, and legumes). Research shows that fiber may lower blood cholesterol levels, especially the water-soluble fiber found in fruits, vegetables, oat products, and legumes. FRUITS AND VEGETABLES It is good to eat fruits and vegetables. Besides being sources of fiber, both are rich in vitamins and some minerals. They help you get the daily allowances of these nutrients. Fruits and vegetables can be used for snacks and desserts. MEATS Limit lean meat, chicken, [place], and fish to no more than 6 ounces per day. Beef, Pork, and Lamb Use lean cuts of beef, pork, and lamb. Lean cuts include:  Extra-lean ground beef.  Arm roast.  Sirloin tip.  Center-cut ham.  Round steak.  Loin chops.  Rump roast.  Tenderloin.  Trim all fat off the outside of meats before cooking. It is not necessary to severely decrease the intake of red meat, but lean choices should be made. Lean meat is rich in protein and contains a highly absorbable form of iron.  Premenopausal women, in particular, should avoid reducing lean red meat because this could increase the risk for low red blood cells (iron-deficiency anemia). The organ meats, such as liver, sweetbreads, kidneys, and brain are very rich in cholesterol. They should be limited. Chicken and [place] These are good sources of protein. The fat of poultry can be reduced by removing the skin and underlying fat layers before cooking. Chicken and [place] can be substituted for lean red meat in the diet. Poultry should not be fried or covered with high-fat sauces. Fish and Shellfish Fish  is a good source of protein. Shellfish contain cholesterol, but they usually are low in saturated fatty acids. The preparation of fish is important. Like chicken and [place], they should not be fried or covered with high-fat sauces. EGGS Egg whites contain no fat or cholesterol. They can be eaten often. Try 1 to 2 egg whites instead of whole eggs in recipes or use egg substitutes that do not contain yolk. MILK AND DAIRY PRODUCTS Use skim or 1% milk instead of 2% or whole milk. Decrease whole milk, natural, and processed cheeses. Use nonfat or low-fat (2%) cottage cheese or low-fat cheeses made from vegetable oils. Choose nonfat or low-fat (1 to 2%) yogurt. Experiment with evaporated skim milk in recipes that call for heavy cream. Substitute low-fat yogurt or low-fat cottage cheese for sour cream in dips and salad dressings. Have at least 2 servings of low-fat dairy products, such as 2 glasses of skim (or 1%) milk each day to help get your daily calcium intake.  FATS AND OILS Reduce the total intake of fats, especially saturated fat. Butterfat, lard, and beef fats are high in saturated fat and cholesterol. These should be avoided as much as possible. Vegetable fats do not contain cholesterol, but certain vegetable fats, such as coconut oil, palm oil, and palm kernel oil are very high in saturated fats. These should be limited. These  fats are often used in bakery goods, processed foods, popcorn, oils, and nondairy creamers. Vegetable shortenings and some peanut butters contain hydrogenated oils, which are also saturated fats. Read the labels on these foods and check for saturated vegetable oils. Unsaturated vegetable oils and fats do not raise blood cholesterol. However, they should be limited because they are fats and are high in calories. Total fat should still be limited to 30% of your daily caloric intake. Desirable liquid vegetable oils are corn oil, cottonseed oil, olive oil, canola oil, safflower oil, soybean oil, and sunflower oil. Peanut oil is not as good, but small amounts are acceptable. Buy a heart-healthy tub margarine that has no partially hydrogenated oils in the ingredients. Mayonnaise and salad dressings often are made from unsaturated fats, but they should also be limited because of their high calorie and fat content. Seeds, nuts, peanut butter, olives, and avocados are high in fat, but the fat is mainly the unsaturated type. These foods should be limited mainly to avoid excess calories and fat. OTHER EATING TIPS Snacks  Most sweets should be limited as snacks. They tend to be rich in calories and fats, and their caloric content outweighs their nutritional value. Some good choices in snacks are graham crackers, melba toast, soda crackers, bagels (no egg), English muffins, fruits, and vegetables. These snacks are preferable to snack crackers, [place] fries, and chips. Popcorn should be air-popped or cooked in small amounts of liquid vegetable oil. Desserts Eat fruit, low-fat yogurt, and fruit ices. AVOID pastries, cake, and cookies. Sherbet, angel food cake, gelatin dessert, frozen low-fat yogurt, or other frozen products that do not contain saturated fat (pure fruit juice bars, frozen ice pops) are also acceptable.  COOKING METHODS Choose those methods that use little or no fat. They include: Poaching.  Braising.    Steaming.  Grilling.  Baking.  Stir-frying.  Broiling.  Microwaving.  Foods can be cooked in a nonstick pan without added fat, or use a nonfat cooking spray in regular cookware. Limit fried foods and avoid frying in saturated fat. Add moisture to lean meats by using water, broth, cooking wines, and  other nonfat or low-fat sauces along with the cooking methods mentioned above. Soups and stews should be chilled after cooking. The fat that forms on top after a few hours in the refrigerator should be skimmed off. When preparing meals, avoid using excess salt. Salt can contribute to raising blood pressure in some people. EATING AWAY FROM HOME Order entres, potatoes, and vegetables without sauces or butter. When meat exceeds the size of a deck of cards (3 to 4 ounces), the rest can be taken home for another meal. Choose vegetable or fruit salads and ask for low-calorie salad dressings to be served on the side. Use dressings sparingly. Limit high-fat toppings, such as bacon, crumbled eggs, cheese, sunflower seeds, and olives. Ask for heart-healthy tub margarine instead of butter.

## 2017-08-13 ENCOUNTER — Telehealth: Payer: Self-pay | Admitting: Gastroenterology

## 2017-08-13 NOTE — Progress Notes (Signed)
LMOM to call.

## 2017-08-13 NOTE — Telephone Encounter (Signed)
Pt is to have CBC in one month per [facility]

## 2017-08-14 ENCOUNTER — Other Ambulatory Visit: Payer: Self-pay

## 2017-08-14 ENCOUNTER — Telehealth: Payer: Self-pay

## 2017-08-14 DIAGNOSIS — D649 Anemia, unspecified: Secondary | ICD-10-CM

## 2017-08-14 MED ORDER — PANTOPRAZOLE SODIUM 40 MG PO TBEC: DELAYED_RELEASE_TABLET | ORAL | 11 refills | Status: DC

## 2017-08-14 NOTE — Progress Notes (Signed)
FYI to Dr. Fields.  

## 2017-08-14 NOTE — Telephone Encounter (Signed)
PLEASE CALL PT. I SENT RX FOR PROTONIX. SHE CAN USE PROTONIX OR USE OTC NEXIUM 20 MG DAILY.

## 2017-08-14 NOTE — Progress Notes (Signed)
Pt is aware. She is going to find out from Dr. Jacinta Shoe if she will go back on blood thinners. Said she will let us know. She said she was not going back to the hospital. I explained to her the [facility] would be out patient. She wants to wait until she finds out about the blood thinners. She will let us know.  She is aware we will mail the order for the CBC in a month.

## 2017-08-14 NOTE — Telephone Encounter (Signed)
CBC on file.

## 2017-08-14 NOTE — Telephone Encounter (Signed)
Pt said she cannot afford the Nexium. I called [facility], [place] and spoke to pharmacist. She said the insurance is showing that the pt's co pay would be $100.00. Please advise!

## 2017-08-14 NOTE — Telephone Encounter (Signed)
LMOM to call.

## 2017-08-14 NOTE — Progress Notes (Signed)
LMOM for pt that Dr. Oneida Alar has spoken to Dr. Jacinta Shoe.

## 2017-08-17 NOTE — Progress Notes (Signed)
LMOM for a return call and also mailing a letter of the info.

## 2017-08-17 NOTE — Telephone Encounter (Signed)
Left the message on Vm and told her to call if she has questions.  

## 2017-08-21 ENCOUNTER — Encounter: Payer: Self-pay | Admitting: *Deleted

## 2017-08-21 ENCOUNTER — Other Ambulatory Visit: Payer: Self-pay | Admitting: *Deleted

## 2017-08-21 DIAGNOSIS — D649 Anemia, unspecified: Secondary | ICD-10-CM

## 2017-09-08 ENCOUNTER — Encounter: Payer: Self-pay | Admitting: Gastroenterology

## 2017-11-04 ENCOUNTER — Ambulatory Visit: Payer: Medicare HMO | Admitting: Cardiovascular Disease

## 2017-11-04 DIAGNOSIS — R0989 Other specified symptoms and signs involving the circulatory and respiratory systems: Secondary | ICD-10-CM

## 2020-02-22 ENCOUNTER — Telehealth: Payer: Self-pay | Admitting: Cardiology

## 2020-02-22 ENCOUNTER — Encounter: Payer: Self-pay | Admitting: *Deleted

## 2020-02-22 NOTE — Telephone Encounter (Signed)
Spoke with Dr Alver Fisher who says pt was recently at [facility] for afib with RVR - f/u with Dr Jimmye Norman yesterday and Dr Jimmye Norman wanted to start and anticoagulation medication but was unsure with HGB 10.6 as of (02/22/2020) Dr Jimmye Norman made aware per notes from Dr Bronson Ing in 2019 pt was referred to GI for anemia of unclear etiology and was scheduled for colonoscopy and doesn't look like pt f/u with GI after this or did repeat CBC after 2019 - Dr Jimmye Norman requested cardiology evaluate pt before starting anticoag meds - [facility] will fax records to [facility] and aware that appt made with Dr Harl Bowie 02/27/2020 they will contact pt - will request d/c summary from [facility]

## 2020-02-22 NOTE — Telephone Encounter (Signed)
New Message    [facility] was transferred from [facility] office to [place] to have Dr Harl Bowie call Dr Jimmye Norman asap,  Nurse that called the message in did not know what it was pertaining to

## 2020-02-22 NOTE — Telephone Encounter (Signed)
Called [facility] to obtain more information. No answer. Will try again.

## 2020-02-27 ENCOUNTER — Ambulatory Visit: Payer: Medicare HMO | Admitting: Cardiology

## 2020-02-27 NOTE — Progress Notes (Deleted)
Clinical Summary Amber Hull is a 86 y.o.female last seen by Dr Bronson Ing, this is our first visit together.  1. Chronic diastolic HF - long history of diastolic HF - admission 11/6810 to [facility] with volume overload - 10/2019 echo LVEF 50%, mild MS, cannot assess diastolic function with afib  2. Afib - from prior cardiology notes 2019  had not been on anticoag due to her chronic anemia, at that time workup was ongoing.   - 2019 colonscopy small polyps, hemorroids. EGD mild gastrititis, no source of bleeding other than possibly duodenal AVM  ?aneurysm  Past Medical History:  Diagnosis Date  . Anemia   . Arthritis   . Asthma   . Atrial fibrillation ([place])   . CHF (congestive heart failure) ([place])   . COPD (chronic obstructive pulmonary disease) ([place])   . History of nuclear stress test 2017   intermediate risk  . History of shingles   . HOH (hard of hearing)   . Mitral stenosis   . Neuralgia   . On home O2    3L N/C     Allergies  Allergen Reactions  . Penicillins Rash    Has patient had a PCN reaction causing immediate rash, facial/tongue/throat swelling, SOB or lightheadedness with hypotension: No Has patient had a PCN reaction causing severe rash involving mucus membranes or skin necrosis: No Has patient had a PCN reaction that required hospitalization: No Has patient had a PCN reaction occurring within the last 10 years: No If all of the above answers are "NO", then may proceed with Cephalosporin use.      Current Outpatient Medications  Medication Sig Dispense Refill  . albuterol (PROVENTIL HFA;VENTOLIN HFA) 108 (90 BASE) MCG/ACT inhaler Inhale into the lungs every 6 (six) hours as needed for wheezing or shortness of breath.    Marland Kitchen albuterol (PROVENTIL) (2.5 MG/3ML) 0.083% nebulizer solution Take 2.5 mg by nebulization every 6 (six) hours as needed for wheezing or shortness of breath.    . budesonide-formoterol (SYMBICORT) 160-4.5 MCG/ACT inhaler Inhale 2  puffs into the lungs 2 (two) times daily.    . calcium carbonate (OS-CAL) 600 MG TABS tablet Take 600 mg by mouth. Doesn't take everyday    . cholecalciferol (VITAMIN D) 1000 UNITS tablet Take 1,000 Units by mouth daily.    Marland Kitchen diltiazem (CARDIZEM CD) 360 MG 24 hr capsule Take 1 capsule (360 mg total) by mouth daily. 90 capsule 3  . esomeprazole (NEXIUM) 40 MG capsule 1 PO 30 MINS PRIOR TO FIRST MEAL 30 capsule 11  . furosemide (LASIX) 40 MG tablet Take 1 tablet (40 mg total) 2 (two) times daily by mouth. 60 tablet 6  . gabapentin (NEURONTIN) 400 MG capsule Take 400 mg by mouth at bedtime.    Marland Kitchen HYDROcodone-acetaminophen (NORCO/VICODIN) 5-325 MG tablet Take 1 tablet by mouth every 6 (six) hours as needed for moderate pain.    . hydrOXYzine (ATARAX/VISTARIL) 25 MG tablet Take 25 mg by mouth at bedtime.    Marland Kitchen MAGNESIUM PO Take by mouth. Twice a week    . montelukast (SINGULAIR) 10 MG tablet Take 10 mg by mouth at bedtime.    . pantoprazole (PROTONIX) 40 MG tablet 1 PO 30 MINUTES PRIOR TO MEALS QD 31 tablet 11  . potassium chloride SA (K-DUR,KLOR-CON) 20 MEQ tablet Take 1 tablet (20 mEq total) by mouth daily. 90 tablet 3  . vitamin B-12 (CYANOCOBALAMIN) 1000 MCG tablet Take 1,000 mcg by mouth daily.    Marland Kitchen  vitamin C (ASCORBIC ACID) 500 MG tablet Take 500 mg by mouth daily.     No current facility-administered medications for this visit.     Past Surgical History:  Procedure Laterality Date  . ABDOMINAL HYSTERECTOMY    . BIOPSY  08/04/2017   Procedure: BIOPSY;  Surgeon: Danie Binder, MD;  [facility];  Service: Endoscopy;;  duodenal gastric  . CHOLECYSTECTOMY    . COLONOSCOPY WITH PROPOFOL N/A 08/04/2017   Procedure: COLONOSCOPY WITH PROPOFOL;  Surgeon: Danie Binder, MD;  [facility];  Service: Endoscopy;  Laterality: N/A;  8:30am  . ESOPHAGOGASTRODUODENOSCOPY (EGD) WITH PROPOFOL N/A 08/04/2017   Procedure: ESOPHAGOGASTRODUODENOSCOPY (EGD) WITH PROPOFOL;  Surgeon:  Danie Binder, MD;  [facility];  Service: Endoscopy;  Laterality: N/A;  . FASCIECTOMY Right 10/17/2014   Procedure: FASCIECTOMY RIGHT RING/SMALL FINGERS;  Surgeon: Daryll Brod, MD;  Location: [place];  Service: Orthopedics;  Laterality: Right;  . KNEE ARTHROSCOPY     left  . POLYPECTOMY  08/04/2017   Procedure: POLYPECTOMY;  Surgeon: Danie Binder, MD;  [facility];  Service: Endoscopy;;  colon  . TUBAL LIGATION    . TYMPANOSTOMY TUBE PLACEMENT     both ears     Allergies  Allergen Reactions  . Penicillins Rash    Has patient had a PCN reaction causing immediate rash, facial/tongue/throat swelling, SOB or lightheadedness with hypotension: No Has patient had a PCN reaction causing severe rash involving mucus membranes or skin necrosis: No Has patient had a PCN reaction that required hospitalization: No Has patient had a PCN reaction occurring within the last 10 years: No If all of the above answers are "NO", then may proceed with Cephalosporin use.       Family History  Problem Relation Age of Onset  . Kidney cancer Mother   . Colon polyps Sister   . Colon cancer Neg Hx      Social History Amber Hull reports that she quit smoking about 33 years ago. Her smoking use included cigarettes. She has a 2.50 pack-year smoking history. She has never used smokeless tobacco. Amber Hull reports no history of alcohol use.   Review of Systems CONSTITUTIONAL: No weight loss, fever, chills, weakness or fatigue.  HEENT: Eyes: No visual loss, blurred vision, double vision or yellow sclerae.No hearing loss, sneezing, congestion, runny nose or sore throat.  SKIN: No rash or itching.  CARDIOVASCULAR:  RESPIRATORY: No shortness of breath, cough or sputum.  GASTROINTESTINAL: No anorexia, nausea, vomiting or diarrhea. No abdominal pain or blood.  GENITOURINARY: No burning on urination, no polyuria NEUROLOGICAL: No headache, dizziness, syncope,  paralysis, ataxia, numbness or tingling in the extremities. No change in bowel or bladder control.  MUSCULOSKELETAL: No muscle, back pain, joint pain or stiffness.  LYMPHATICS: No enlarged nodes. No history of splenectomy.  PSYCHIATRIC: No history of depression or anxiety.  ENDOCRINOLOGIC: No reports of sweating, cold or heat intolerance. No polyuria or polydipsia.  Marland Kitchen   Physical Examination There were no vitals filed for this visit. There were no vitals filed for this visit.  Gen: resting comfortably, no acute distress HEENT: no scleral icterus, pupils equal round and reactive, no palptable cervical adenopathy,  CV Resp: Clear to auscultation bilaterally GI: abdomen is soft, non-tender, non-distended, normal bowel sounds, no hepatosplenomegaly MSK: extremities are warm, no edema.  Skin: warm, no rash Neuro:  no focal deficits Psych: appropriate affect   Diagnostic Studies  Assessment and Plan        Arnoldo Lenis, M.D., F.A.C.C.

## 2020-03-15 ENCOUNTER — Encounter: Payer: Self-pay | Admitting: *Deleted

## 2020-03-15 ENCOUNTER — Ambulatory Visit: Payer: Medicare PPO | Admitting: Family Medicine

## 2020-03-15 ENCOUNTER — Encounter: Payer: Self-pay | Admitting: Family Medicine

## 2020-03-15 ENCOUNTER — Other Ambulatory Visit: Payer: Self-pay

## 2020-03-15 VITALS — BP 118/64 | HR 109 | Ht 68.0 in | Wt 197.8 lb

## 2020-03-15 DIAGNOSIS — I480 Paroxysmal atrial fibrillation: Secondary | ICD-10-CM

## 2020-03-15 DIAGNOSIS — I5033 Acute on chronic diastolic (congestive) heart failure: Secondary | ICD-10-CM

## 2020-03-15 DIAGNOSIS — Z8679 Personal history of other diseases of the circulatory system: Secondary | ICD-10-CM

## 2020-03-15 MED ORDER — APIXABAN 5 MG PO TABS: 5.0000 mg | ORAL_TABLET | Freq: Two times a day (BID) | ORAL | 6 refills | Status: DC

## 2020-03-15 MED ORDER — APIXABAN 5 MG PO TABS: 5.0000 mg | ORAL_TABLET | Freq: Two times a day (BID) | ORAL | 0 refills | Status: DC

## 2020-03-15 NOTE — Progress Notes (Signed)
Cardiology Office Note  Date: 03/15/2020   ID: Amber, Hull Feb 07, 1932, MRN [id number]  PCP:  Amber Rhodes, MD  Cardiologist:  Amber Dolly, MD Electrophysiologist:  None   Chief Complaint: Atrial fibrillation with RVR.  History of Present Illness: Amber Hull is a 86 y.o. female with a history of atrial fibrillation, diastolic CHF, COPD, mitral stenosis.  Last seen by Dr. Bronson Hull on 07/24/2017 for history of rapid atrial fibrillation and chronic diastolic heart failure.  She had been battling a lung infection and wheezing with coughing for the previous week.  Her PCP had prescribed an antibiotic and a "blood thinner".  Saw gastroenterology on June 18, 2017 was scheduled for colonoscopy on August 04, 2017.  She ran out of her long-acting diltiazem 3 and 6 mg tabs and had been taking 240 mg.  She described having fatigue.  During that visit Dr. Bronson Hull stated she could not be safely anticoagulation until etiology of her anemia was evaluated and treated.  She was to undergo the colonoscopy on 08/04/2017.  Her chronic diastolic heart failure symptoms were stable.  Her weight was stable.  She was to continue her Lasix 40 mg p.o. twice daily.  She had a subsequent colonoscopy and EGD on 08/04/2017.  Colonoscopy showed; polyps were discovered at the hepatic flexure and ascending colon removed with cold snare.  Diverticulosis noted in rectosigmoid colon, sigmoid colon, and descending colon.  Redundant left colon, external and internal hemorrhoids.  EGD showed LA grade B reflux esophagitis, medium size hiatal hernia, a few gastric polyps, resected and retrieved.  Gastric mucosal atrophy.  There was no obvious source for anemia identified.  She had a duodenal AVM.    Recent admission to [facility] for acute diastolic congestive heart failure, PAF on 10/24/2019.  Note sent from Dr. Domenic Polite stating; Dr. Jimmye Hull at [facility] called on 02/22/2020  stating patient had recently been at [facility] for atrial fibrillation with RVR.  Dr. Jimmye Hull wanted to start anticoagulation medication but was unsure with hemoglobin 10.6 as of 02/22/2020.  Dr. Jimmye Hull was made aware per notes from Dr. Bronson Hull 2019.  Patient was referred for GI for anemia of unclear etiology was scheduled for colonoscopy and EGD.  Dr. Jimmye Hull requested cardiology evaluate patient before starting anticoagulation medications. An appointment was made with [facility] in [place].   Patient is here for evaluation at request of Dr. Jimmye Hull at [facility].  He wanted to start patient on anticoagulation due to atrial fibrillation, but was unsure with recent hemoglobin 10.6 on 02/22/2020.  Patient had a previous colonoscopy in January 2019 as well as an EGD.  Results are noted above.  Patient has no obvious source of blood loss.  History of hemoglobin ranging from 10.5-11 over the past 3 years.  Is currently in atrial fibrillation with rate controlled at 89 on EKG today.  Rate currently controlled with Cardizem CD 240 mg daily.  Interestingly she is on Entresto 24/26 mg.  Patient states her primary care provider Dr. Kerin Hull had placed her on the [facility].  She has no history of systolic heart failure with EF less than 40% according to our records.  However she does have a history of diastolic heart failure she denies any anginal or exertional symptoms.  She does have COPD.  She is on continuous oxygen.   Past Medical History:  Diagnosis Date  . Anemia   . Arthritis   . Asthma   . Atrial fibrillation (  [place])   . CHF (congestive heart failure) ([place])   . COPD (chronic obstructive pulmonary disease) ([place])   . History of nuclear stress test 2017   intermediate risk  . History of shingles   . HOH (hard of hearing)   . Mitral stenosis   . Neuralgia   . On home O2    3L N/C    Past Surgical History:  Procedure Laterality Date  . ABDOMINAL HYSTERECTOMY    . BIOPSY   08/04/2017   Procedure: BIOPSY;  Surgeon: Danie Binder, MD;  [facility];  Service: Endoscopy;;  duodenal gastric  . CHOLECYSTECTOMY    . COLONOSCOPY WITH PROPOFOL N/A 08/04/2017   Procedure: COLONOSCOPY WITH PROPOFOL;  Surgeon: Danie Binder, MD;  [facility];  Service: Endoscopy;  Laterality: N/A;  8:30am  . ESOPHAGOGASTRODUODENOSCOPY (EGD) WITH PROPOFOL N/A 08/04/2017   Procedure: ESOPHAGOGASTRODUODENOSCOPY (EGD) WITH PROPOFOL;  Surgeon: Danie Binder, MD;  [facility];  Service: Endoscopy;  Laterality: N/A;  . FASCIECTOMY Right 10/17/2014   Procedure: FASCIECTOMY RIGHT RING/SMALL FINGERS;  Surgeon: Daryll Brod, MD;  Location: [place];  Service: Orthopedics;  Laterality: Right;  . KNEE ARTHROSCOPY     left  . POLYPECTOMY  08/04/2017   Procedure: POLYPECTOMY;  Surgeon: Danie Binder, MD;  [facility];  Service: Endoscopy;;  colon  . TUBAL LIGATION    . TYMPANOSTOMY TUBE PLACEMENT     both ears    Current Outpatient Medications  Medication Sig Dispense Refill  . albuterol (PROVENTIL HFA;VENTOLIN HFA) 108 (90 BASE) MCG/ACT inhaler Inhale into the lungs every 6 (six) hours as needed for wheezing or shortness of breath.    Marland Kitchen albuterol (PROVENTIL) (2.5 MG/3ML) 0.083% nebulizer solution Take 2.5 mg by nebulization every 6 (six) hours as needed for wheezing or shortness of breath.    . budesonide-formoterol (SYMBICORT) 160-4.5 MCG/ACT inhaler Inhale 2 puffs into the lungs 2 (two) times daily.    . calcium carbonate (OS-CAL) 600 MG TABS tablet Take 600 mg by mouth. Doesn't take everyday    . cholecalciferol (VITAMIN D) 1000 UNITS tablet Take 1,000 Units by mouth daily.    Marland Kitchen diltiazem (CARDIZEM CD) 240 MG 24 hr capsule Take 1 tablet by mouth daily.    Marland Kitchen esomeprazole (NEXIUM) 40 MG capsule 1 PO 30 MINS PRIOR TO FIRST MEAL 30 capsule 11  . furosemide (LASIX) 40 MG tablet Take 1 tablet (40 mg total) 2 (two) times daily by mouth. 60  tablet 6  . gabapentin (NEURONTIN) 400 MG capsule Take 400 mg by mouth at bedtime.    . hydrOXYzine (ATARAX/VISTARIL) 25 MG tablet Take 25 mg by mouth at bedtime.    Marland Kitchen MAGNESIUM PO Take by mouth. Twice a week    . montelukast (SINGULAIR) 10 MG tablet Take 10 mg by mouth at bedtime.    . sacubitril-valsartan (ENTRESTO) 24-26 MG Take 1 tablet by mouth 2 (two) times daily.    . vitamin B-12 (CYANOCOBALAMIN) 1000 MCG tablet Take 1,000 mcg by mouth daily.    . vitamin C (ASCORBIC ACID) 500 MG tablet Take 500 mg by mouth daily.     No current facility-administered medications for this visit.   Allergies:  Penicillins   Social History: The patient  reports that she quit smoking about 33 years ago. Her smoking use included cigarettes. She has a 2.50 pack-year smoking history. She has never used smokeless tobacco. She reports that she does not  drink alcohol and does not use drugs.   Family History: The patient's family history includes Colon polyps in her sister; Kidney cancer in her mother.   ROS:  Please see the history of present illness. Otherwise, complete review of systems is positive for none.  All other systems are reviewed and negative.   Physical Exam: VS:  BP 118/64   Pulse (!) 109   Ht 5\' 8"  (1.727 m)   Wt 197 lb 12.8 oz (89.7 kg)   SpO2 96%   BMI 30.08 kg/m , BMI Body mass index is 30.08 kg/m.  Wt Readings from Last 3 Encounters:  03/15/20 197 lb 12.8 oz (89.7 kg)  08/04/17 200 lb (90.7 kg)  07/29/17 200 lb (90.7 kg)    General: Patient appears comfortable at rest. Neck: Supple, no elevated JVP or carotid bruits, no thyromegaly. Lungs: Clear to auscultation, nonlabored breathing at rest. Cardiac: Irregularly irregular rate and rhythm, no S3 or significant systolic murmur, no pericardial rub. Abdomen: Soft, nontender, no hepatomegaly, bowel sounds present, no guarding or rebound. Extremities: No pitting edema, distal pulses 2+. Skin: Warm and dry. Musculoskeletal: No  kyphosis. Neuropsychiatric: Alert and oriented x3, affect grossly appropriate.  ECG:  An ECG dated 03/15/2020 was personally reviewed today and demonstrated:  Atrial fibrillation with a rate of 89, cannot rule out anterior infarct, age undetermined.  Recent Labwork: No results found for requested labs within last 8760 hours.  No results found for: CHOL, TRIG, HDL, CHOLHDL, VLDL, LDLCALC, LDLDIRECT  Other Studies Reviewed Today:  Echocardiogram [facility] 10/24/2019  Summary 1. The left ventricle is normal in size with normal wall thickness. 2. The left ventricular systolic function is borderline, LVEF is visually estimated at 50%. 3. The mitral valve leaflets are mildly thickened with mildly reduced leaflet mobility. 4. There is mild mitral valve regurgitation and minimal stenosis as below. 5. The left atrium is moderately to severely dilated in size. 6. The right ventricle is normal in size, with normal systolic function.   Left Ventricle The left ventricle is normal in size with normal wall thickness. The left ventricular systolic function is borderline, LVEF is visually estimated at 50%. Left ventricular diastolic function cannot be accurately assessed.  Right Ventricle The right ventricle is normal in size, with normal systolic function.   Left Atrium The left atrium is moderately to severely dilated in size.  Right Atrium The right atrium is normal in size.   Aortic Valve The aortic valve is trileaflet with normal appearing leaflets with normal excursion. There is no significant aortic regurgitation. There is no evidence of a significant transvalvular gradient.  Pulmonic Valve The pulmonic valve is normal. There is no significant pulmonic regurgitation. There is no evidence of a significant transvalvular gradient.  Mitral Valve The mitral valve leaflets are mildly thickened with mildly reduced leaflet mobility. Mitral annular calcification is present  (mild). There is mild mitral valve regurgitation and minimal stenosis as below. Posterior leaflet is restricted but with minimal mitral valve stenosis (mean gradient 4-5 mmHg at HR 95 bpm, MVA 2.66 cm2).  Tricuspid Valve The tricuspid valve leaflets are poorly visualized but probably normal, with normal leaflet mobility. There is mild tricuspid regurgitation. There is no pulmonary hypertension, estimated pulmonary artery systolic pressure is 36 mmHg.   Other Findings Rhythm: Atrial Fibrillation.  Pericardium/Pleural There is no pericardial effusion.  Inferior Vena Cava The IVC is suboptimally visualized but probably suggests normal right atrial pressure.  Aorta  The aorta is normal in size in  the visualized segments.   Assessment and Plan:  1. Acute on chronic diastolic heart failure (HCC)   2. Paroxysmal atrial fibrillation ([place])   3. History of mitral valve stenosis    1. Acute on chronic diastolic heart failure ([place]) Recent admission to [facility] with acute diastolic heart failure.  Echocardiogram 10/24/2019 at [facility].  LVEF 50%.  Mild MR and minimal mitral stenosis.  LA moderate to severely dilated.  Stop Entresto.  Continue Lasix 40 mg p.o. twice daily.  2. Paroxysmal atrial fibrillation (HCC) History of PAF with recent hospital admission with A. fib with RVR.  Sent by PCP for evaluation prior to being started on DOAC due to history of anemia.  Continue diltiazem CD 240 mg daily.  Please get a baseline basic metabolic panel prior to starting Eliquis.  If creatinine greater than 1.5 we will need to adjust dosing down.  3. History of mitral valve stenosis  mild mitral valve regurgitation and minimal stenosis as below. Posterior leaflet is restricted but with minimal mitral valve stenosis (mean gradient 4-5 mmHg at HR 95 bpm, MVA 2.66 cm2).   Medication Adjustments/Labs and Tests Ordered: Current medicines are reviewed at length with the patient today.   Concerns regarding medicines are outlined above.   Disposition: Follow-up with Dr. Harl Bowie or [facility] 1 month  Signed, Levell July, NP 03/15/2020 2:40 PM    [facility] [place], [place], [place] [address] Phone: [phone]; Fax: [phone]

## 2020-03-15 NOTE — Patient Instructions (Addendum)
Medication Instructions:   Begin Eliquis 5mg  twice a day.  Stop Entresto.  Continue all other medications.    Labwork:  BMET - order given today.  Please do with in the next several days.   CBC - order given today.   Please do in 2-3 weeks.  Office will contact with results via phone or letter.    Testing/Procedures: none  Follow-Up: 1 month   Any Other Special Instructions Will Be Listed Below (If Applicable).  If you need a refill on your cardiac medications before your next appointment, please call your pharmacy.

## 2020-03-27 ENCOUNTER — Telehealth: Payer: Self-pay | Admitting: *Deleted

## 2020-03-27 MED ORDER — APIXABAN 2.5 MG PO TABS: 2.5000 mg | ORAL_TABLET | Freq: Two times a day (BID) | ORAL | Status: DC

## 2020-03-27 NOTE — Telephone Encounter (Signed)
Noted, documented in chart per staff message as well.

## 2020-03-27 NOTE — Telephone Encounter (Signed)
-----   Message from Verta Ellen., NP sent at 03/27/2020  4:34 PM EDT ----- Regarding: eliquis dosage Her recent Crt was 1.66 and her age is greater than 39. Her eliquis needs to be 2.5 mg po bid. I just talked to Dr. Jimmye Norman a [facility]. He is going to start that dose. Her hemoglobin is chronically low. He is going to follow with lab work in a week or two after starting the eliquis with a CBC and probably a BMET.

## 2020-03-27 NOTE — Telephone Encounter (Signed)
Yes I looked at the BMET.  Her creatinine is 1.66.  I sent separate note to you regarding dosing of the Eliquis.  Eliquis dosing needs to be 2.5 mg p.o. twice daily based on creatinine results and age greater than 84.  I spoke to Dr. Jimmye Norman her primary care provider today.  He is starting Eliquis at 2.5 mg today.  She has chronic anemia.  He states he is going to follow her closely within a week or so of starting the Eliquis to check her CBC and renal function.  Thank you.

## 2020-03-27 NOTE — Telephone Encounter (Signed)
Noted, will update medication list.

## 2020-03-27 NOTE — Telephone Encounter (Signed)
Patient last seen 03/15/2020 - BMET ordered - please see "care everywhere" for results.  Done at [facility].

## 2020-04-02 ENCOUNTER — Encounter: Payer: Self-pay | Admitting: *Deleted

## 2020-04-03 ENCOUNTER — Encounter: Payer: Self-pay | Admitting: *Deleted

## 2020-04-11 NOTE — Progress Notes (Signed)
Error - Erroneous Encounter 

## 2020-04-12 ENCOUNTER — Encounter: Payer: Medicare PPO | Admitting: Family Medicine

## 2020-04-12 ENCOUNTER — Telehealth: Payer: Self-pay | Admitting: *Deleted

## 2020-04-12 NOTE — Telephone Encounter (Signed)
-----   Message from Verta Ellen., NP sent at 04/12/2020  2:40 PM EDT ----- Regarding: Please send to Dr. Stana Bunting at [facility] Please send this note to Dr. Stana Bunting today and tell him Ms. Amber [place] did not show for her appointment.  Tell him if he wants to reach out to her and have those labs drawn that he requested he may need to do that.  He wanted [place] to order a CBC and CMP.  He can also call her and tell her to stop the Eliquis but she needs to be on at least aspirin 81 mg daily.  She needs to stop her Entresto due to decrease in renal function with recent creatinine of 2.12.  She can reduce her Lasix to 20 mg daily.  I am not sure what to make of the discharge instructions from recent hospital stay where she is on 2 separate dosages of Cardizem.  Discharge medication list states Cardizem CD 240 mg daily.  The other states Cardizem CD 360 mg daily.  Would likely opt for Cardizem CD 360 given her recent admission for atrial fib with RVR.  Thank you

## 2020-04-12 NOTE — Telephone Encounter (Signed)
Contacted [facility] to advise that patient rescheduled her appointment from today to 04/27/2020. Spoke with receptionist and advised that Dr. Jimmye Norman had spoken with Katina Dung NP today about making medication changes and these were not done due to patient canceling visit. Information will be faxed to Dr. Stana Bunting.

## 2020-04-26 NOTE — Progress Notes (Signed)
Cardiology Office Note  Date: 04/27/2020   ID: Jolana Runkles, Alferd Apa 1931/08/07, MRN [id number]  PCP:  Friendship Nation, MD  Cardiologist:  Carlyle Dolly, MD Electrophysiologist:  None   Chief Complaint: Atrial fibrillation with RVR.  History of Present Illness: Amber Hull is a 86 y.o. female with a history of atrial fibrillation, diastolic CHF, COPD, mitral stenosis.  Last seen by Dr. Bronson Ing on 07/24/2017 for history of rapid atrial fibrillation and chronic diastolic heart failure.  She had been battling a lung infection and wheezing with coughing for the previous week.  Her PCP had prescribed an antibiotic and a "blood thinner".  Saw gastroenterology on June 18, 2017 was scheduled for colonoscopy on August 04, 2017.  She ran out of her long-acting diltiazem 3 and 6 mg tabs and had been taking 240 mg.  She described having fatigue.  During that visit Dr. Bronson Ing stated she could not be safely anticoagulation until etiology of her anemia was evaluated and treated.  She was to undergo the colonoscopy on 08/04/2017.  Her chronic diastolic heart failure symptoms were stable.  Her weight was stable.  She was to continue her Lasix 40 mg p.o. twice daily.  She had a subsequent colonoscopy and EGD on 08/04/2017.  Colonoscopy showed; polyps were discovered at the hepatic flexure and ascending colon removed with cold snare.  Diverticulosis noted in rectosigmoid colon, sigmoid colon, and descending colon.  Redundant left colon, external and internal hemorrhoids. EGD showed LA grade B reflux esophagitis, medium size hiatal hernia, a few gastric polyps, resected and retrieved.  Gastric mucosal atrophy.  There was no obvious source for anemia identified.  She had a duodenal AVM.    Recent admission to [facility] for acute diastolic congestive heart failure, PAF on 10/24/2019.  Note sent from Dr. Domenic Polite stating; Dr. Jimmye Norman at [facility] called on 02/22/2020  stating patient had recently been at [facility] for atrial fibrillation with RVR.  Dr. Jimmye Norman wanted to start anticoagulation medication but was unsure with hemoglobin 10.6 as of 02/22/2020.  Dr. Jimmye Norman was made aware per notes from Dr. Bronson Ing 2019.  Patient was referred to r GI for anemia of unclear etiology was scheduled for colonoscopy and EGD.  Dr. Jimmye Norman requested cardiology evaluate patient before starting anticoagulation medications. An appointment was [facility] with [facility] in [place].   Was last here for evaluation at request of Dr. Jimmye Norman at [facility].  He wanted to start patient on anticoagulation due to atrial fibrillation, but was unsure with recent hemoglobin 10.6 on 02/22/2020.  Patient had a previous colonoscopy in January 2019 as well as an EGD.  Results are noted above.  Patient has no obvious source of blood loss.  History of hemoglobin ranging from 10.5 -11 over the past 3 years.  Was in atrial fibrillation with rate controlled at 89 on EKG  Rate currently controlled with Cardizem CD 240 mg daily.  Interestingly she was on Entresto 24/26 mg.  Patient states her primary care provider Dr. Kerin Perna had placed her on the [facility].  She had no history of systolic heart failure with EF less than 40% according to our records.  However she does have a history of diastolic heart failure she denies any anginal or exertional symptoms.  She does have COPD.  She is on continuous oxygen.  Recent admission to [facility] on 04/05/2020 for atrial fibrillation with RVR.  She complained of weakness and increasing exertional dyspnea over the prior  week.  BNP was elevated, hemoglobin was 8.6.  Admitted for treatment of atrial fibrillation with RVR as well as CHF.  She received gentle diuresis as well as oxygen support and Cardizem drip.  Echocardiogram in April with EF of 50%.  PT worked with patient and recommended skilled nursing facility but patient declined and wanted to be  discharged home.  She was continuing her home Lasix and oxygen as prescribed.  She was discharged on 04/09/2020.  Discharge diagnosis acute respiratory distress presumed multifactorial, atrial fibrillation with RVR, chronic diastolic heart failure, anemia of chronic disease, stage III chronic kidney disease, pulmonary fibrosis.  She is here for 1 month follow-up.  I had a conversation with her primary care provider on 04/12/2020.  He called me on that day.  He was concerned over her hemoglobin being low after recent hospital visit.  He thought the Eliquis should be discontinued.  He also wanted a CBC and CMP.  She canceled her visit due to feeling bad.  Her creatinine was elevated at 2.12.  She is here today with no particular complaints other than some chronic dyspnea for which she is on continuous O2 for COPD.  Blood pressure well controlled with blood pressure today 112/62.  Heart rate is 74.  Her atrial fibrillation rate is controlled on Cardizem CD 240 mg daily.   Denies any anginal symptoms, orthostatic symptoms, CVA or TIA-like symptoms, PND, orthopnea, claudication-like pain, DVT or PE-like symptoms.  She complains of some lower extremity edema right greater than left.  States her right leg anterior medial part of her shin/tibia is tender to touch.  States she does not have much energy and has to rest frequently because she gives out of breath and has exertional fatigue.  She states a nurse came to her house yesterday and drew some blood.  We called [facility] to see if they had ordered the blood work they had previously requested [place] do.  They had not.  Patient is complaining that she cannot afford her inhalers and is asking what to do about getting a new inhaler that may be cheaper.  Urged her to call her PCP to see what he could do.   Past Medical History:  Diagnosis Date  . Anemia   . Arthritis   . Asthma   . Atrial fibrillation ([place])   . CHF (congestive heart failure) ([place])   .  COPD (chronic obstructive pulmonary disease) ([place])   . History of nuclear stress test 2017   intermediate risk  . History of shingles   . HOH (hard of hearing)   . Mitral stenosis   . Neuralgia   . On home O2    3L N/C    Past Surgical History:  Procedure Laterality Date  . ABDOMINAL HYSTERECTOMY    . BIOPSY  08/04/2017   Procedure: BIOPSY;  Surgeon: Danie Binder, MD;  [facility];  Service: Endoscopy;;  duodenal gastric  . CHOLECYSTECTOMY    . COLONOSCOPY WITH PROPOFOL N/A 08/04/2017   Procedure: COLONOSCOPY WITH PROPOFOL;  Surgeon: Danie Binder, MD;  Location: [facility];  Service: Endoscopy;  Laterality: N/A;  8:30am  . ESOPHAGOGASTRODUODENOSCOPY (EGD) WITH PROPOFOL N/A 08/04/2017   Procedure: ESOPHAGOGASTRODUODENOSCOPY (EGD) WITH PROPOFOL;  Surgeon: Danie Binder, MD;  Location[facility];  Service: Endoscopy;  Laterality: N/A;  . FASCIECTOMY Right 10/17/2014   Procedure: FASCIECTOMY RIGHT RING/SMALL FINGERS;  Surgeon: Daryll Brod, MD;  Location: [facility];  Service:  Orthopedics;  Laterality: Right;  . KNEE ARTHROSCOPY     left  . POLYPECTOMY  08/04/2017   Procedure: POLYPECTOMY;  Surgeon: Danie Binder, MD;  [facility];  Service: Endoscopy;;  colon  . TUBAL LIGATION    . TYMPANOSTOMY TUBE PLACEMENT     both ears    Current Outpatient Medications  Medication Sig Dispense Refill  . albuterol (PROVENTIL HFA;VENTOLIN HFA) 108 (90 BASE) MCG/ACT inhaler Inhale into the lungs every 6 (six) hours as needed for wheezing or shortness of breath.    Marland Kitchen albuterol (PROVENTIL) (2.5 MG/3ML) 0.083% nebulizer solution Take 2.5 mg by nebulization every 6 (six) hours as needed for wheezing or shortness of breath.    Marland Kitchen aspirin EC 81 MG tablet Take 81 mg by mouth daily. Swallow whole.    . calcium carbonate (OS-CAL) 600 MG TABS tablet Take 600 mg by mouth. Doesn't take everyday    . cholecalciferol (VITAMIN D) 1000 UNITS tablet Take 1,000 Units  by mouth daily.    Marland Kitchen diltiazem (CARDIZEM CD) 240 MG 24 hr capsule Take 1 tablet by mouth daily.    Marland Kitchen esomeprazole (NEXIUM) 40 MG capsule 1 PO 30 MINS PRIOR TO FIRST MEAL 30 capsule 11  . furosemide (LASIX) 20 MG tablet Take 1 tablet (20 mg total) by mouth 2 (two) times daily. 180 tablet 1  . gabapentin (NEURONTIN) 400 MG capsule Take 400 mg by mouth at bedtime.    . hydrOXYzine (ATARAX/VISTARIL) 25 MG tablet Take 25 mg by mouth at bedtime.    Marland Kitchen MAGNESIUM PO Take by mouth. Twice a week    . montelukast (SINGULAIR) 10 MG tablet Take 10 mg by mouth at bedtime.    . vitamin B-12 (CYANOCOBALAMIN) 1000 MCG tablet Take 1,000 mcg by mouth daily.    . vitamin C (ASCORBIC ACID) 500 MG tablet Take 500 mg by mouth daily.     No current facility-administered medications for this visit.   Allergies:  Penicillins   Social History: The patient  reports that she quit smoking about 33 years ago. Her smoking use included cigarettes. She has a 2.50 pack-year smoking history. She has never used smokeless tobacco. She reports that she does not drink alcohol and does not use drugs.   Family History: The patient's family history includes Colon polyps in her sister; Kidney cancer in her mother.   ROS:  Please see the history of present illness. Otherwise, complete review of systems is positive for none.  All other systems are reviewed and negative.   Physical Exam: VS:  BP 110/65   Pulse 87   Ht 5\' 8"  (1.727 m)   Wt 196 lb (88.9 kg)   SpO2 97%   BMI 29.80 kg/m , BMI Body mass index is 29.8 kg/m.  Wt Readings from Last 3 Encounters:  04/27/20 196 lb (88.9 kg)  03/15/20 197 lb 12.8 oz (89.7 kg)  08/04/17 200 lb (90.7 kg)    General: Patient appears comfortable at rest. Neck: Supple, no elevated JVP or carotid bruits, no thyromegaly. Lungs: Clear to auscultation, nonlabored breathing at rest. Cardiac: Irregularly irregular rate and rhythm, no S3 or significant systolic murmur, no pericardial  rub. Abdomen: Soft, nontender, no hepatomegaly, bowel sounds present, no guarding or rebound. Extremities: No pitting edema, distal pulses 2+. Skin: Warm and dry. Musculoskeletal: No kyphosis. Neuropsychiatric: Alert and oriented x3, affect grossly appropriate.  ECG:  An ECG dated 03/15/2020 was personally reviewed today and demonstrated:  Atrial fibrillation with a  rate of 89, cannot rule out anterior infarct, age undetermined.  Recent Labwork: No results found for requested labs within last 8760 hours.  No results found for: CHOL, TRIG, HDL, CHOLHDL, VLDL, LDLCALC, LDLDIRECT  Other Studies Reviewed Today:  Echocardiogram [facility] 10/24/2019  Summary 1. The left ventricle is normal in size with normal wall thickness. 2. The left ventricular systolic function is borderline, LVEF is visually estimated at 50%. 3. The mitral valve leaflets are mildly thickened with mildly reduced leaflet mobility. 4. There is mild mitral valve regurgitation and minimal stenosis as below. 5. The left atrium is moderately to severely dilated in size. 6. The right ventricle is normal in size, with normal systolic function.   Left Ventricle The left ventricle is normal in size with normal wall thickness. The left ventricular systolic function is borderline, LVEF is visually estimated at 50%. Left ventricular diastolic function cannot be accurately assessed.  Right Ventricle The right ventricle is normal in size, with normal systolic function.   Left Atrium The left atrium is moderately to severely dilated in size.  Right Atrium The right atrium is normal in size.   Aortic Valve The aortic valve is trileaflet with normal appearing leaflets with normal excursion. There is no significant aortic regurgitation. There is no evidence of a significant transvalvular gradient.  Pulmonic Valve The pulmonic valve is normal. There is no significant pulmonic regurgitation. There is no evidence of  a significant transvalvular gradient.  Mitral Valve The mitral valve leaflets are mildly thickened with mildly reduced leaflet mobility. Mitral annular calcification is present (mild). There is mild mitral valve regurgitation and minimal stenosis as below. Posterior leaflet is restricted but with minimal mitral valve stenosis (mean gradient 4-5 mmHg at HR 95 bpm, MVA 2.66 cm2).  Tricuspid Valve The tricuspid valve leaflets are poorly visualized but probably normal, with normal leaflet mobility. There is mild tricuspid regurgitation. There is no pulmonary hypertension, estimated pulmonary artery systolic pressure is 36 mmHg.   Other Findings Rhythm: Atrial Fibrillation.  Pericardium/Pleural There is no pericardial effusion.  Inferior Vena Cava The IVC is suboptimally visualized but probably suggests normal right atrial pressure.  Aorta  The aorta is normal in size in the visualized segments.   Assessment and Plan:   1. Acute on chronic diastolic heart failure ([place]) Recent admission to [facility] with acute diastolic heart failure.  Echocardiogram 10/24/2019 at [place].  LVEF 50%.  Mild MR and minimal mitral stenosis.  LA moderate to severely dilated.  We had stopped her Entresto at last visit due to no clear indication.  She had no evidence of prior decreased LV function less than 40%.  Decrease Lasix to 20 mg p.o. twice daily due to her worsening renal function.  2. Paroxysmal atrial fibrillation (HCC) History of PAF with recent hospital admission with A. fib with RVR.   Continue diltiazem CD 240 mg daily.  Eliquis was DC'd at request of PCP due to decrease in hemoglobin from 10.6-8.6.  We are starting aspirin 81 mg daily.  3. History of mitral valve stenosis  Mild mitral valve regurgitation and minimal stenosis as below. Posterior leaflet is restricted but with minimal mitral valve stenosis (mean gradient 4-5 mmHg at HR 95 bpm, MVA 2.66 cm2).  4.   Anemia Patient had a recent decrease in hemoglobin from 10.6-8.6.  Dr. Jimmye Norman from [facility] called on 04/12/2020 suggesting we may need to stop the Eliquis.  We are stopping Eliquis today.  Start aspirin 81 mg daily.  5.  CKD stage III Lab work drawn 04/09/2020 showed creatinine to 2.12 with GFR of 21.  Refer to nephrology Dr. Theador Hawthorne.  We have decreased her Lasix in the meantime to 20 mg p.o. twice daily.  Medication Adjustments/Labs and Tests Ordered: Current medicines are reviewed at length with the patient today.  Concerns regarding medicines are outlined above.   Disposition: Follow-up with Dr. Harl Bowie or APP 3 months  Signed, Levell July, NP 04/27/2020 3:56 PM    [facility] [place], [place], [place] [address] Phone: [phone]; Fax: [phone]

## 2020-04-27 ENCOUNTER — Other Ambulatory Visit: Payer: Self-pay

## 2020-04-27 ENCOUNTER — Ambulatory Visit: Payer: Medicare PPO | Admitting: Family Medicine

## 2020-04-27 ENCOUNTER — Encounter: Payer: Self-pay | Admitting: Family Medicine

## 2020-04-27 VITALS — BP 110/65 | HR 87 | Ht 68.0 in | Wt 196.0 lb

## 2020-04-27 DIAGNOSIS — I480 Paroxysmal atrial fibrillation: Secondary | ICD-10-CM | POA: Diagnosis not present

## 2020-04-27 DIAGNOSIS — I5032 Chronic diastolic (congestive) heart failure: Secondary | ICD-10-CM | POA: Diagnosis not present

## 2020-04-27 DIAGNOSIS — Z8679 Personal history of other diseases of the circulatory system: Secondary | ICD-10-CM

## 2020-04-27 DIAGNOSIS — I132 Hypertensive heart and chronic kidney disease with heart failure and with stage 5 chronic kidney disease, or end stage renal disease: Secondary | ICD-10-CM | POA: Diagnosis not present

## 2020-04-27 DIAGNOSIS — N289 Disorder of kidney and ureter, unspecified: Secondary | ICD-10-CM

## 2020-04-27 MED ORDER — FUROSEMIDE 40 MG PO TABS
20.0000 mg | ORAL_TABLET | Freq: Every day | ORAL | 1 refills | Status: DC
Start: 2020-04-27 — End: 2020-04-27

## 2020-04-27 MED ORDER — FUROSEMIDE 20 MG PO TABS
20.0000 mg | ORAL_TABLET | Freq: Two times a day (BID) | ORAL | 1 refills | Status: DC
Start: 2020-04-27 — End: 2020-06-13

## 2020-04-27 NOTE — Patient Instructions (Addendum)
Medication Instructions:   START TAKING:   1. LASIX 20 MG TWICE A DAY   2. ASPIRIN 81 MG  ONCE A DAY      STOP TAKING:  1.  ELIQUIS  2.5 MG   *If you need a refill on your cardiac medications before your next appointment, please call your pharmacy*   Lab Work: [place]   If you have labs (blood work) drawn today and your tests are completely normal, you will receive your results only by: Marland Kitchen MyChart Message (if you have MyChart) OR . A paper copy in the mail If you have any lab test that is abnormal or we need to change your treatment, we will call you to review the results.   Testing/Procedures: NONE ORDERED  TODAY   Follow-Up: At [facility], you and your health needs are our priority.  As part of our continuing mission to provide you with exceptional heart care, we have created designated Provider Care Teams.  These Care Teams include your primary Cardiologist (physician) and Advanced Practice Providers (APPs -  Physician Assistants and Nurse Practitioners) who all work together to provide you with the care you need, when you need it.  We recommend signing up for the patient portal called "MyChart".  Sign up information is provided on this After Visit Summary.  MyChart is used to connect with patients for Virtual Visits (Telemedicine).  Patients are able to view lab/test results, encounter notes, upcoming appointments, etc.  Non-urgent messages can be sent to your provider as well.   To learn more about what you can do with MyChart, go to [email or web address].    Your next appointment:   3 month(s)  The format for your next appointment:   In Person  Provider:   Katina Dung, NP   You have been referred to Nephrology Dr. Theador Hawthorne     Other Instructions

## 2020-05-08 ENCOUNTER — Ambulatory Visit: Payer: Medicare PPO | Admitting: Cardiology

## 2020-05-21 NOTE — Progress Notes (Deleted)
Cardiology Office Note  Date: 05/21/2020   ID: Karin Pinedo, Alferd Apa [birth year], MRN [id number]  PCP:  [place] Nation, MD  Cardiologist:  Carlyle Dolly, MD Electrophysiologist:  None   Chief Complaint: Atrial fibrillation with RVR.  History of Present Illness: Amber Hull is a 86 y.o. female with a history of atrial fibrillation, diastolic CHF, COPD, mitral stenosis.  Last seen by Dr. Bronson Ing on 07/24/2017 for history of rapid atrial fibrillation and chronic diastolic heart failure.  She had been battling a lung infection and wheezing with coughing for the previous week.  Her PCP had prescribed an antibiotic and a "blood thinner".  Saw gastroenterology on June 18, 2017 was scheduled for colonoscopy on August 04, 2017.  She ran out of her long-acting diltiazem 3 and 6 mg tabs and had been taking 240 mg.  She described having fatigue.  During that visit Dr. Bronson Ing stated she could not be safely anticoagulation until etiology of her anemia was evaluated and treated.  She was to undergo the colonoscopy on 08/04/2017.  Her chronic diastolic heart failure symptoms were stable.  Her weight was stable.  She was to continue her Lasix 40 mg p.o. twice daily.  She had a subsequent colonoscopy and EGD on 08/04/2017.  Colonoscopy showed; polyps were discovered at the hepatic flexure and ascending colon removed with cold snare.  Diverticulosis noted in rectosigmoid colon, sigmoid colon, and descending colon.  Redundant left colon, external and internal hemorrhoids. EGD showed LA grade B reflux esophagitis, medium size hiatal hernia, a few gastric polyps, resected and retrieved.  Gastric mucosal atrophy.  There was no obvious source for anemia identified.  She had a duodenal AVM.    Recent admission to [facility] for acute diastolic congestive heart failure, PAF on 10/24/2019.  Note sent from Dr. Domenic Polite stating; Dr. Jimmye Norman at [facility] called on 02/22/2020  stating patient had recently been at [facility] for atrial fibrillation with RVR.  Dr. Jimmye Norman wanted to start anticoagulation medication but was unsure with hemoglobin 10.6 as of 02/22/2020.  Dr. Jimmye Norman was made aware per notes from Dr. Bronson Ing 2019.  Patient was referred to r GI for anemia of unclear etiology was scheduled for colonoscopy and EGD.  Dr. Jimmye Norman requested cardiology evaluate patient before starting anticoagulation medications. An appointment was made with [facility] in [place].   Was last here for evaluation at request of Dr. Jimmye Norman at [facility].  He wanted to start patient on anticoagulation due to atrial fibrillation, but was unsure with recent hemoglobin 10.6 on 02/22/2020.  Patient had a previous colonoscopy in January 2019 as well as an EGD.  Results are noted above.  Patient has no obvious source of blood loss.  History of hemoglobin ranging from 10.5 -11 over the past 3 years.  Was in atrial fibrillation with rate controlled at 89 on EKG  Rate currently controlled with Cardizem CD 240 mg daily.  Interestingly she was on Entresto 24/26 mg.  Patient states her primary care provider Dr. Kerin Perna had placed her on [facility].  She had no history of systolic heart failure with EF less than 40% according to our records.  However she does have a history of diastolic heart failure she denies any anginal or exertional symptoms.  She does have COPD.  She is on continuous oxygen.  Recent admission to [place] on 04/05/2020 for atrial fibrillation with RVR.  She complained of weakness and increasing exertional dyspnea over the prior  week.  BNP was elevated, hemoglobin was 8.6.  Admitted for treatment of atrial fibrillation with RVR as well as CHF.  She received gentle diuresis as well as oxygen support and Cardizem drip.  Echocardiogram in April with EF of 50%.  PT worked with patient and recommended skilled nursing facility but patient declined and wanted to be  discharged home.  She was continuing her home Lasix and oxygen as prescribed.  She was discharged on 04/09/2020.  Discharge diagnosis acute respiratory distress presumed multifactorial, atrial fibrillation with RVR, chronic diastolic heart failure, anemia of chronic disease, stage III chronic kidney disease, pulmonary fibrosis.  At last visit she was here for 1 month follow-up on 03/15/2020.  I had a conversation with her primary care provider on 04/12/2020.  He was concerned over her hemoglobin being low after recent hospital visit.  He thought the Eliquis should be discontinued.  Her creatinine was elevated at 2.12.   Her atrial fibrillation rate was controlled on Cardizem CD 240 mg daily.  She was scheduled for follow-up after hospital admission for heart failure and RVR on 04/12/2020.  She canceled that appointment.  Past Medical History:  Diagnosis Date  . Anemia   . Arthritis   . Asthma   . Atrial fibrillation ([place])   . CHF (congestive heart failure) ([place])   . COPD (chronic obstructive pulmonary disease) ([place])   . History of nuclear stress test 2017   intermediate risk  . History of shingles   . HOH (hard of hearing)   . Mitral stenosis   . Neuralgia   . On home O2    3L N/C    Past Surgical History:  Procedure Laterality Date  . ABDOMINAL HYSTERECTOMY    . BIOPSY  08/04/2017   Procedure: BIOPSY;  Surgeon: Danie Binder, MD;  [facility];  Service: Endoscopy;;  duodenal gastric  . CHOLECYSTECTOMY    . COLONOSCOPY WITH PROPOFOL N/A 08/04/2017   Procedure: COLONOSCOPY WITH PROPOFOL;  Surgeon: Danie Binder, MD;  [facility];  Service: Endoscopy;  Laterality: N/A;  8:30am  . ESOPHAGOGASTRODUODENOSCOPY (EGD) WITH PROPOFOL N/A 08/04/2017   Procedure: ESOPHAGOGASTRODUODENOSCOPY (EGD) WITH PROPOFOL;  Surgeon: Danie Binder, MD;  [facility];  Service: Endoscopy;  Laterality: N/A;  . FASCIECTOMY Right 10/17/2014   Procedure: FASCIECTOMY RIGHT RING/SMALL  FINGERS;  Surgeon: Daryll Brod, MD;  Location: [facility];  Service: Orthopedics;  Laterality: Right;  . KNEE ARTHROSCOPY     left  . POLYPECTOMY  08/04/2017   Procedure: POLYPECTOMY;  Surgeon: Danie Binder, MD;  [facility];  Service: Endoscopy;;  colon  . TUBAL LIGATION    . TYMPANOSTOMY TUBE PLACEMENT     both ears    Current Outpatient Medications  Medication Sig Dispense Refill  . albuterol (PROVENTIL HFA;VENTOLIN HFA) 108 (90 BASE) MCG/ACT inhaler Inhale into the lungs every 6 (six) hours as needed for wheezing or shortness of breath.    Marland Kitchen albuterol (PROVENTIL) (2.5 MG/3ML) 0.083% nebulizer solution Take 2.5 mg by nebulization every 6 (six) hours as needed for wheezing or shortness of breath.    Marland Kitchen aspirin EC 81 MG tablet Take 81 mg by mouth daily. Swallow whole.    . calcium carbonate (OS-CAL) 600 MG TABS tablet Take 600 mg by mouth. Doesn't take everyday    . cholecalciferol (VITAMIN D) 1000 UNITS tablet Take 1,000 Units by mouth daily.    Marland Kitchen diltiazem (CARDIZEM CD) 240 MG 24  hr capsule Take 1 tablet by mouth daily.    Marland Kitchen esomeprazole (NEXIUM) 40 MG capsule 1 PO 30 MINS PRIOR TO FIRST MEAL 30 capsule 11  . furosemide (LASIX) 20 MG tablet Take 1 tablet (20 mg total) by mouth 2 (two) times daily. 180 tablet 1  . gabapentin (NEURONTIN) 400 MG capsule Take 400 mg by mouth at bedtime.    . hydrOXYzine (ATARAX/VISTARIL) 25 MG tablet Take 25 mg by mouth at bedtime.    Marland Kitchen MAGNESIUM PO Take by mouth. Twice a week    . montelukast (SINGULAIR) 10 MG tablet Take 10 mg by mouth at bedtime.    . vitamin B-12 (CYANOCOBALAMIN) 1000 MCG tablet Take 1,000 mcg by mouth daily.    . vitamin C (ASCORBIC ACID) 500 MG tablet Take 500 mg by mouth daily.     No current facility-administered medications for this visit.   Allergies:  Penicillins   Social History: The patient  reports that she quit smoking about 33 years ago. Her smoking use included cigarettes. She has a 2.50  pack-year smoking history. She has never used smokeless tobacco. She reports that she does not drink alcohol and does not use drugs.   Family History: The patient's family history includes Colon polyps in her sister; Kidney cancer in her mother.   ROS:  Please see the history of present illness. Otherwise, complete review of systems is positive for none.  All other systems are reviewed and negative.   Physical Exam: VS:  There were no vitals taken for this visit., BMI There is no height or weight on file to calculate BMI.  Wt Readings from Last 3 Encounters:  04/27/20 196 lb (88.9 kg)  03/15/20 197 lb 12.8 oz (89.7 kg)  08/04/17 200 lb (90.7 kg)    General: Patient appears comfortable at rest. Neck: Supple, no elevated JVP or carotid bruits, no thyromegaly. Lungs: Clear to auscultation, nonlabored breathing at rest. Cardiac: Irregularly irregular rate and rhythm, no S3 or significant systolic murmur, no pericardial rub. Abdomen: Soft, nontender, no hepatomegaly, bowel sounds present, no guarding or rebound. Extremities: No pitting edema, distal pulses 2+. Skin: Warm and dry. Musculoskeletal: No kyphosis. Neuropsychiatric: Alert and oriented x3, affect grossly appropriate.  ECG:  An ECG dated 03/15/2020 was personally reviewed today and demonstrated:  Atrial fibrillation with a rate of 89, cannot rule out anterior infarct, age undetermined.  Recent Labwork: No results found for requested labs within last 8760 hours.  No results found for: CHOL, TRIG, HDL, CHOLHDL, VLDL, LDLCALC, LDLDIRECT  Recent lab work from PCP office on 04/27/2020: BUN 25, creatinine 1.92, alkaline phosphatase 142.  Hemoglobin 8.6, hematocrit 27.5.   Other Studies Reviewed Today:  Echocardiogram [facility] 10/24/2019 Summary 1. The left ventricle is normal in size with normal wall thickness. 2. The left ventricular systolic function is borderline, LVEF is visually estimated at 50%. 3. The mitral valve  leaflets are mildly thickened with mildly reduced leaflet mobility. 4. There is mild mitral valve regurgitation and minimal stenosis as below. 5. The left atrium is moderately to severely dilated in size. 6. The right ventricle is normal in size, with normal systolic function.   Left Ventricle The left ventricle is normal in size with normal wall thickness. The left ventricular systolic function is borderline, LVEF is visually estimated at 50%. Left ventricular diastolic function cannot be accurately assessed.  Right Ventricle The right ventricle is normal in size, with normal systolic function.   Left Atrium The left atrium is  moderately to severely dilated in size.  Right Atrium The right atrium is normal in size.   Aortic Valve The aortic valve is trileaflet with normal appearing leaflets with normal excursion. There is no significant aortic regurgitation. There is no evidence of a significant transvalvular gradient.  Pulmonic Valve The pulmonic valve is normal. There is no significant pulmonic regurgitation. There is no evidence of a significant transvalvular gradient.  Mitral Valve The mitral valve leaflets are mildly thickened with mildly reduced leaflet mobility. Mitral annular calcification is present (mild). There is mild mitral valve regurgitation and minimal stenosis as below. Posterior leaflet is restricted but with minimal mitral valve stenosis (mean gradient 4-5 mmHg at HR 95 bpm, MVA 2.66 cm2).  Tricuspid Valve The tricuspid valve leaflets are poorly visualized but probably normal, with normal leaflet mobility. There is mild tricuspid regurgitation. There is no pulmonary hypertension, estimated pulmonary artery systolic pressure is 36 mmHg.   Other Findings Rhythm: Atrial Fibrillation.  Pericardium/Pleural There is no pericardial effusion.  Inferior Vena Cava The IVC is suboptimally visualized but probably suggests normal right  atrial pressure.  Aorta  The aorta is normal in size in the visualized segments.   Assessment and Plan:   1. Acute on chronic diastolic heart failure ([place]) Recent admission to [place] with acute diastolic heart failure.  Echocardiogram 10/24/2019 at [place].  LVEF 50%.  Mild MR and minimal mitral stenosis.  LA moderate to severely dilated.  We had stopped her Entresto at last visit due to no clear indication.  She had no evidence of prior decreased LV function less than 40%.  Decrease Lasix to 20 mg p.o. twice daily due to her worsening renal function.  2. Paroxysmal atrial fibrillation (H[facility]) History of PAF with recent hospital admission with A. fib with RVR.   Continue diltiazem CD 240 mg daily.  Eliquis was DC'd at request of PCP due to decrease in hemoglobin from 10.6-8.6.  We are starting aspirin 81 mg daily.  3. History of mitral valve stenosis  Mild mitral valve regurgitation and minimal stenosis as below. Posterior leaflet is restricted but with minimal mitral valve stenosis (mean gradient 4-5 mmHg at HR 95 bpm, MVA 2.66 cm2).  4.  Anemia Patient had a recent decrease in hemoglobin from 10.6-8.6.  Dr. Jimmye Norman from [facility] called on 04/12/2020 suggesting we may need to stop the Eliquis.  We are stopping Eliquis today.  Start aspirin 81 mg daily.  5.  CKD stage III Lab work drawn 04/09/2020 showed creatinine to 2.12 with GFR of 21.  Refer to nephrology Dr. Theador Hawthorne.  We have decreased her Lasix in the meantime to 20 mg p.o. twice daily.  Medication Adjustments/Labs and Tests Ordered: Current medicines are reviewed at length with the patient today.  Concerns regarding medicines are outlined above.   Disposition: Follow-up with Dr. Harl Bowie or [facility] 3 months  Signed, Levell July, NP 05/21/2020 4:39 PM    [facility] [place], [address], [place] [address] Phone: [phone]; Fax: [phone]

## 2020-05-22 ENCOUNTER — Ambulatory Visit: Payer: Medicare PPO | Admitting: Family Medicine

## 2020-05-28 ENCOUNTER — Encounter: Payer: Self-pay | Admitting: *Deleted

## 2020-05-28 NOTE — Progress Notes (Signed)
Cardiology Office Note  Date: 05/30/2020   ID: Sander Speckman, Alferd Apa August 30, 1931, MRN [id number]  PCP:  [place] Nation, MD  Cardiologist:  Carlyle Dolly, MD Electrophysiologist:  None   Chief Complaint: Atrial fibrillation with RVR.  History of Present Illness: Miracle Bellina Tokarczyk is a 86 y.o. female with a history of atrial fibrillation, diastolic CHF, COPD, mitral stenosis.  Last seen by Dr. Bronson Ing on 07/24/2017 for history of rapid atrial fibrillation and chronic diastolic heart failure.  She had been battling a lung infection and wheezing with coughing for the previous week.  Her PCP had prescribed an antibiotic and a "blood thinner".  Saw gastroenterology on June 18, 2017 was scheduled for colonoscopy on August 04, 2017.  She ran out of her long-acting diltiazem 3 and 6 mg tabs and had been taking 240 mg.  She described having fatigue.  During that visit Dr. Bronson Ing stated she could not be safely anticoagulation until etiology of her anemia was evaluated and treated.  She was to undergo the colonoscopy on 08/04/2017.  Her chronic diastolic heart failure symptoms were stable.  Her weight was stable.  She was to continue her Lasix 40 mg p.o. twice daily.  She had a subsequent colonoscopy and EGD on 08/04/2017.  Colonoscopy showed; polyps were discovered at the hepatic flexure and ascending colon removed with cold snare.  Diverticulosis noted in rectosigmoid colon, sigmoid colon, and descending colon.  Redundant left colon, external and internal hemorrhoids. EGD showed LA grade B reflux esophagitis, medium size hiatal hernia, a few gastric polyps, resected and retrieved.  Gastric mucosal atrophy.  There was no obvious source for anemia identified.  She had a duodenal AVM.    Recent admission to [facility] for acute diastolic congestive heart failure, PAF on 10/24/2019.  Note sent from Dr. Domenic Polite stating; Dr. Jimmye Norman at [facility] called on 02/22/2020  stating patient had recently been at [facility] for atrial fibrillation with RVR.  Dr. Jimmye Norman wanted to start anticoagulation medication but was unsure with hemoglobin 10.6 as of 02/22/2020.  Dr. Jimmye Norman was made aware per notes from Dr. Bronson Ing 2019.  Patient was referred to GI for anemia of unclear etiology was scheduled for colonoscopy and EGD.  Dr. Jimmye Norman requested cardiology evaluate patient before starting anticoagulation medications. An appointment was made with [facility] in [place].   Previously here for evaluation at request of Dr. Jimmye Norman at [facility].  He wanted to start patient on anticoagulation due to atrial fibrillation, but was unsure with recent hemoglobin 10.6 on 02/22/2020.  Patient had a previous colonoscopy in January 2019 as well as an EGD.  Results are noted above.  Patient has no obvious source of blood loss.  History of hemoglobin ranging from 10.5 -11 over the past 3 years.  Was in atrial fibrillation with rate controlled at 89 on EKG  Rate currently controlled with Cardizem CD 240 mg daily.  Interestingly she was on Entresto 24/26 mg.  Patient states her primary care provider Dr. Kerin Perna had placed her on the [facility].  She had no history of systolic heart failure with EF less than 40% according to our records.  However she did have a history of diastolic heart failure she denied any anginal or exertional symptoms.  She does have COPD.  She is on continuous oxygen.  Recent admission to [facility] on 04/05/2020 for atrial fibrillation with RVR.  She complained of weakness and increasing exertional dyspnea over the prior week.  BNP was elevated, hemoglobin was 8.6.  Admitted for treatment of atrial fibrillation with RVR as well as CHF.  She received gentle diuresis as well as oxygen support and Cardizem drip.  Echocardiogram in April with EF of 50%.  PT worked with patient and recommended skilled nursing facility but patient declined and wanted to be  discharged home.  She was continuing her home Lasix and oxygen as prescribed.  She was discharged on 04/09/2020.  Discharge diagnosis acute respiratory distress presumed multifactorial, atrial fibrillation with RVR, chronic diastolic heart failure, anemia of chronic disease, stage III chronic kidney disease, pulmonary fibrosis.  At last visit she was here for 1 month follow-up on 03/15/2020.  I had a conversation with her primary care provider on 04/12/2020.  He was concerned over her hemoglobin being low after recent hospital visit.  He thought the Eliquis should be discontinued.  Her creatinine was elevated at 2.12.   Her atrial fibrillation rate was controlled on Cardizem CD 240 mg daily.  She was scheduled for follow-up after hospital admission for heart failure and RVR on 04/12/2020.   Here today after recent visit with PCP on 05/15/2020. At PCP she c/o of leg pain / knee pain. She c/o SOB ongoing alleviated by sitting up, exacerbated by exertion and lying down per PCP note. PCP notes patient  taking Tiadylt ER 300 mg daily for atrial fibrillation. Our records indicate she was taking Diltiazem  CD at 240 mg po daily. She has no particular complaints today. She is on continuous 02 via [place] for COPD/ chronic respiratory failure/ ILD. Sees Dr Koleen Nimrod pulmonology [place]. EKG today Atrial fibrillation rate of 99. Patient states she is not always compliant with diuretic therapy and admits to missing doses of Lasix on occasion. PCP notes from recent visit   05/15/2020 mentioned she had gained 5 lbs recently. PCP increased dose of Lasix to 40 mg po bid x 2 days only. " Pt to continue Lasix 40 mg Q pm and to return to 40 mg po bid after 2 days". Denies any anginal symptoms. No CVA/TIA like symptoms.    Past Medical History:  Diagnosis Date  . Anemia   . Arthritis   . Asthma   . Atrial fibrillation ([place])   . CHF (congestive heart failure) ([place])   . COPD (chronic obstructive pulmonary disease) ([place])   .  History of nuclear stress test 2017   intermediate risk  . History of shingles   . HOH (hard of hearing)   . Mitral stenosis   . Neuralgia   . On home O2    3L N/C    Past Surgical History:  Procedure Laterality Date  . ABDOMINAL HYSTERECTOMY    . BIOPSY  08/04/2017   Procedure: BIOPSY;  Surgeon: Danie Binder, MD;  [facility];  Service: Endoscopy;;  duodenal gastric  . CHOLECYSTECTOMY    . COLONOSCOPY WITH PROPOFOL N/A 08/04/2017   Procedure: COLONOSCOPY WITH PROPOFOL;  Surgeon: Danie Binder, MD;  Location[facility];  Service: Endoscopy;  Laterality: N/A;  8:30am  . ESOPHAGOGASTRODUODENOSCOPY (EGD) WITH PROPOFOL N/A 08/04/2017   Procedure: ESOPHAGOGASTRODUODENOSCOPY (EGD) WITH PROPOFOL;  Surgeon: Danie Binder, MD;  [facility];  Service: Endoscopy;  Laterality: N/A;  . FASCIECTOMY Right 10/17/2014   Procedure: FASCIECTOMY RIGHT RING/SMALL FINGERS;  Surgeon: Daryll Brod, MD;  Location: [facility];  Service: Orthopedics;  Laterality: Right;  . KNEE ARTHROSCOPY     left  .  POLYPECTOMY  08/04/2017   Procedure: POLYPECTOMY;  Surgeon: Danie Binder, MD;  [facility];  Service: Endoscopy;;  colon  . TUBAL LIGATION    . TYMPANOSTOMY TUBE PLACEMENT     both ears    Current Outpatient Medications  Medication Sig Dispense Refill  . albuterol (PROVENTIL HFA;VENTOLIN HFA) 108 (90 BASE) MCG/ACT inhaler Inhale into the lungs every 6 (six) hours as needed for wheezing or shortness of breath.    Marland Kitchen albuterol (PROVENTIL) (2.5 MG/3ML) 0.083% nebulizer solution Take 2.5 mg by nebulization every 6 (six) hours as needed for wheezing or shortness of breath.    Marland Kitchen aspirin EC 81 MG tablet Take 81 mg by mouth daily. Swallow whole.    . calcium carbonate (OS-CAL) 600 MG TABS tablet Take 600 mg by mouth. Doesn't take everyday    . cholecalciferol (VITAMIN D) 1000 UNITS tablet Take 1,000 Units by mouth daily.    Marland Kitchen diltiazem (CARDIZEM CD) 240 MG 24  hr capsule Take 1 tablet by mouth daily.    Marland Kitchen esomeprazole (NEXIUM) 40 MG capsule 1 PO 30 MINS PRIOR TO FIRST MEAL 30 capsule 11  . furosemide (LASIX) 20 MG tablet Take 1 tablet (20 mg total) by mouth 2 (two) times daily. 180 tablet 1  . gabapentin (NEURONTIN) 400 MG capsule Take 400 mg by mouth at bedtime.    . hydrOXYzine (ATARAX/VISTARIL) 25 MG tablet Take 25 mg by mouth at bedtime.    Marland Kitchen MAGNESIUM PO Take by mouth. Twice a week    . montelukast (SINGULAIR) 10 MG tablet Take 10 mg by mouth at bedtime.    . vitamin B-12 (CYANOCOBALAMIN) 1000 MCG tablet Take 1,000 mcg by mouth daily.    . vitamin C (ASCORBIC ACID) 500 MG tablet Take 500 mg by mouth daily.    . metoprolol tartrate (LOPRESSOR) 25 MG tablet Take 0.5 tablets (12.5 mg total) by mouth 2 (two) times daily. 30 tablet 6   No current facility-administered medications for this visit.   Allergies:  Penicillins   Social History: The patient  reports that she quit smoking about 33 years ago. Her smoking use included cigarettes. She has a 2.50 pack-year smoking history. She has never used smokeless tobacco. She reports that she does not drink alcohol and does not use drugs.   Family History: The patient's family history includes Colon polyps in her sister; Kidney cancer in her mother.   ROS:  Please see the history of present illness. Otherwise, complete review of systems is positive for none.  All other systems are reviewed and negative.   Physical Exam: VS:  BP 138/60   Pulse 93   Ht 5\' 8"  (1.727 m)   Wt 191 lb (86.6 kg)   SpO2 99% Comment: 3 L/min today  BMI 29.04 kg/m , BMI Body mass index is 29.04 kg/m.  Wt Readings from Last 3 Encounters:  05/29/20 191 lb (86.6 kg)  04/27/20 196 lb (88.9 kg)  03/15/20 197 lb 12.8 oz (89.7 kg)    General: Patient appears comfortable at rest. Neck: Supple, no elevated JVP or carotid bruits, no thyromegaly. Lungs: Fine crackles heard in posterior and anterior lung bases bilaterally,  nonlabored breathing at rest. Cardiac: Irregularly irregular rate and rhythm, no S3 or significant systolic murmur, no pericardial rub. Extremities: No pitting edema, distal pulses 2+. Skin: Warm and dry. Musculoskeletal: No kyphosis. Neuropsychiatric: Alert and oriented x3, affect grossly appropriate.  ECG:  An ECG dated 05/29/2020 was personally reviewed today and  demonstrated:  Atrial fibrillation with premature ventricular complexes or aberrantly conducted complexes rate of 99.  Cannot rule out anterior infarct, age undetermined.  Recent Labwork: No results found for requested labs within last 8760 hours.  No results found for: CHOL, TRIG, HDL, CHOLHDL, VLDL, LDLCALC, LDLDIRECT  Recent lab work from PCP office on 04/27/2020: BUN 25, creatinine 1.92, alkaline phosphatase 142.  Hemoglobin 8.6, hematocrit 27.5.   Other Studies Reviewed Today:  Echocardiogram [facility] 10/24/2019 Summary 1. The left ventricle is normal in size with normal wall thickness. 2. The left ventricular systolic function is borderline, LVEF is visually estimated at 50%. 3. The mitral valve leaflets are mildly thickened with mildly reduced leaflet mobility. 4. There is mild mitral valve regurgitation and minimal stenosis as below. 5. The left atrium is moderately to severely dilated in size. 6. The right ventricle is normal in size, with normal systolic function.   Left Ventricle The left ventricle is normal in size with normal wall thickness. The left ventricular systolic function is borderline, LVEF is visually estimated at 50%. Left ventricular diastolic function cannot be accurately assessed.  Right Ventricle The right ventricle is normal in size, with normal systolic function.   Left Atrium The left atrium is moderately to severely dilated in size.  Right Atrium The right atrium is normal in size.   Aortic Valve The aortic valve is trileaflet with normal appearing leaflets with  normal excursion. There is no significant aortic regurgitation. There is no evidence of a significant transvalvular gradient.  Pulmonic Valve The pulmonic valve is normal. There is no significant pulmonic regurgitation. There is no evidence of a significant transvalvular gradient.  Mitral Valve The mitral valve leaflets are mildly thickened with mildly reduced leaflet mobility. Mitral annular calcification is present (mild). There is mild mitral valve regurgitation and minimal stenosis as below. Posterior leaflet is restricted but with minimal mitral valve stenosis (mean gradient 4-5 mmHg at HR 95 bpm, MVA 2.66 cm2).  Tricuspid Valve The tricuspid valve leaflets are poorly visualized but probably normal, with normal leaflet mobility. There is mild tricuspid regurgitation. There is no pulmonary hypertension, estimated pulmonary artery systolic pressure is 36 mmHg.   Other Findings Rhythm: Atrial Fibrillation.  Pericardium/Pleural There is no pericardial effusion.  Inferior Vena Cava The IVC is suboptimally visualized but probably suggests normal right atrial pressure.  Aorta  The aorta is normal in size in the visualized segments.   Assessment and Plan:   1. Acute on chronic diastolic heart failure ([place]) Recent admission to [place] with acute diastolic heart failure.  Echocardiogram 10/24/2019 at [place].  LVEF 50%.  Mild MR and minimal mitral stenosis.  LA moderate to severely dilated.  We had stopped her Entresto at last visit due to no clear indication.  She had no evidence of prior decreased LV function less than 40%.  At prior visit her Lasix dose was decreased to 20 mg po bid 2/2 decreased renal function. PCP notes 05/15/2020 indicate she had recently gained 5 lbs. He increased her dose of Lasix to 40 mg po bid x 2 days only. " Pt to continue Lasix 40 mg Q pm and to return to 40 mg po bid after 2 days". Will clarify with PCP correct dosing. It appears from  recent weights she has lost approximately 5 lbs since 04/27/2020.  2. Paroxysmal atrial fibrillation ([place]) History of PAF with recent hospital admission with A. fib with RVR.  PCP notes state patient taking Tiadylt ER 300 mg  daily.  Our records she was taking Diltiazem 240 mg po daily. Eliquis was previously DC'd at request of PCP due to decrease in hemoglobin from 10.6-8.6.  Continue aspirin 81 mg daily. Will clarify increase of Diltiazem (Tiadylt) to 300 mg daily with PCP. Adding metoprolol 12.5 mg po daily for better rate control. EKG today noted atrial fibrillation rate of 99.   3. History of mitral valve stenosis  Mild mitral valve regurgitation and minimal stenosis as below. Posterior leaflet is restricted but with minimal mitral valve stenosis (mean gradient 4-5 mmHg at HR 95 bpm, MVA 2.66 cm2).  4.  Anemia Patient had a recent decrease in hemoglobin from 10.6-8.6.  Dr. Jimmye Norman from [facility] called on 04/12/2020 suggesting we may need to stop the Eliquis. Eliquis was stopped at request  of PCP d/t anemia.  Continue ASA 81 mg po daily. No active bleeding per patient.   5.  CKD stage III Lab work drawn 04/09/2020 showed creatinine to 2.12 with GFR of 21.  Referred to nephrology Dr. Theador Hawthorne at last visit.  We had decreased her Lasix  to 20 mg p.o. twice daily d/t decreased renal function. PCP recently increased dosing at 05/15/2020 visit to 40 mg po bid per his note due to weight gain of 5 lbs.  Medication Adjustments/Labs and Tests Ordered: Current medicines are reviewed at length with the patient today.  Concerns regarding medicines are outlined above.   Disposition: Follow-up with Dr. Harl Bowie or [facility] 3 months  Signed, Levell July, NP 05/30/2020 11:22 PM    [facility] [place], [place], [place] [address] Phone: [phone]; Fax: [phone]

## 2020-05-29 ENCOUNTER — Encounter: Payer: Self-pay | Admitting: Family Medicine

## 2020-05-29 ENCOUNTER — Ambulatory Visit (INDEPENDENT_AMBULATORY_CARE_PROVIDER_SITE_OTHER): Payer: Medicare PPO | Admitting: Family Medicine

## 2020-05-29 VITALS — BP 138/60 | HR 93 | Ht 68.0 in | Wt 191.0 lb

## 2020-05-29 DIAGNOSIS — Z8679 Personal history of other diseases of the circulatory system: Secondary | ICD-10-CM | POA: Diagnosis not present

## 2020-05-29 DIAGNOSIS — N183 Chronic kidney disease, stage 3 unspecified: Secondary | ICD-10-CM

## 2020-05-29 DIAGNOSIS — D649 Anemia, unspecified: Secondary | ICD-10-CM

## 2020-05-29 DIAGNOSIS — I480 Paroxysmal atrial fibrillation: Secondary | ICD-10-CM

## 2020-05-29 DIAGNOSIS — I5033 Acute on chronic diastolic (congestive) heart failure: Secondary | ICD-10-CM | POA: Diagnosis not present

## 2020-05-29 MED ORDER — METOPROLOL TARTRATE 25 MG PO TABS: 12.5000 mg | ORAL_TABLET | Freq: Two times a day (BID) | ORAL | 6 refills | Status: DC

## 2020-05-29 NOTE — Patient Instructions (Addendum)
Medication Instructions:   Begin Lopressor 12.5mg  twice a day.  Continue all other medications.    Labwork: none  Testing/Procedures: none  Follow-Up: Keep already scheduled with Dr. Harl Bowie   Any Other Special Instructions Will Be Listed Below (If Applicable).  If you need a refill on your cardiac medications before your next appointment, please call your pharmacy.

## 2020-05-30 ENCOUNTER — Encounter: Payer: Self-pay | Admitting: Family Medicine

## 2020-05-31 NOTE — Addendum Note (Signed)
Addended by: Julian Hy T on: 05/31/2020 11:24 AM   Modules accepted: Orders

## 2020-06-06 ENCOUNTER — Telehealth: Payer: Self-pay | Admitting: *Deleted

## 2020-06-06 NOTE — Telephone Encounter (Signed)
Verta Ellen., NP  Arnoldo Lenis, MD; Laurine Blazer, LPN Edd Fabian, Can you call her PCP office and clarify dosages on Cardizem and Lasix. It appears he has her listed as taking Diltiazem CD ( Tiadylt) at 300 mg daily. It also appears he has increased her Lasix dosage to 40 mg po bid. This is not reflected in the medication list. I obtained this information from his recent note which was faxed to [place] for her follow up. Thanks

## 2020-06-06 NOTE — Telephone Encounter (Signed)
Left message to return call 

## 2020-06-11 NOTE — Telephone Encounter (Signed)
Left message to return call 

## 2020-06-11 NOTE — Telephone Encounter (Signed)
NEW MESSAGE     PATIENT RETURNING CALL FROM Wednesday FROM [place]

## 2020-06-11 NOTE — Telephone Encounter (Signed)
Patient returned call. She is very hard of hearing.

## 2020-06-13 MED ORDER — DILTIAZEM HCL ER COATED BEADS 300 MG PO CP24
300.0000 mg | ORAL_CAPSULE | Freq: Every day | ORAL | Status: DC
Start: 2020-06-13 — End: 2020-08-17

## 2020-06-13 MED ORDER — FUROSEMIDE 40 MG PO TABS: 40.0000 mg | ORAL_TABLET | Freq: Two times a day (BID) | ORAL | Status: DC

## 2020-06-13 NOTE — Telephone Encounter (Signed)
Ok. Thank You! °

## 2020-06-13 NOTE — Telephone Encounter (Signed)
Patient stated that she is taking Diltiazem CD 300mg  daily & Lasix 40mg  twice a day.   Medication list updated to reflect changes.    Also, stated that she saw pcp recently & was given Doxycycline 100mg  for infection.  She will follow up with pcp again for recheck next Wednesday.  She will be seeing her pulmonologist in [place] on 07/11/20.  Dr. Harl Bowie f/u is for January 2022

## 2020-08-01 ENCOUNTER — Telehealth: Payer: Self-pay | Admitting: Cardiology

## 2020-08-01 NOTE — Telephone Encounter (Signed)
Pt called stating that her PCP Dr. Jimmye Norman took her off her fluid pill and now her feet and legs are so swollen that she can barely walk. State she's going to go back on her lasix.  Please call [phone]

## 2020-08-01 NOTE — Telephone Encounter (Signed)
Patient stated that her pcp told her to stay offf x 5 days, then to weigh daily - is supposed to call him tomorrow to update him on the weights.  States that she plans to restart the fluid medication tomorrow.  Advised her to follow up with pcp as planned since he is treating this current issue.  She verbalized understanding.

## 2020-08-03 ENCOUNTER — Ambulatory Visit: Payer: Medicare PPO | Admitting: Cardiology

## 2020-08-16 NOTE — Progress Notes (Unsigned)
Cardiology Office Note  Date: 08/17/2020   ID: Aubreanna, Percle [birth year], MRN [id number]  PCP:  [place] Nation, MD  Cardiologist:  Carlyle Dolly, MD Electrophysiologist:  None   Chief Complaint: Cardiac follow up  History of Present Illness: Amber Hull is a 87 y.o. female with a history of atrial fibrillation, diastolic CHF, COPD, mitral stenosis.  Presented to [facility] ED for SOB.  complaining SOB x 2 weeks. Recent exposure to Covid.  Tested Covid positive. Dx Covid pneumonia. Hgb 8.6, Hct 27.9, Crt 1.68.CXR bilateral pulmonary opacities on chronic interstitial changes may reflect edema or pneumonia. She was treated with Azithromycin and prednisone.  She is here for 62-month follow-up today.  States she recently had a follow-up with her primary care provider.  States she is feeling much better since she recovered from the Covid pneumonia virus.  She is wearing continuous O2 today.  She denies any significant DOE or S OB.  Denies any PND, orthopnea, weight gain, or lower extremity edema.  She is continuing to take her Lasix 40 mg p.o. twice daily.  She states sometimes she does not take it as directed.  Denies any anginal symptoms, sensation of palpitations, orthostatic symptoms, stroke or TIA-like symptoms.  Denies bleeding.  No complaints of claudication or DVT/PE-like symptoms.  No complaints of lower extremity edema.   Past Medical History:  Diagnosis Date  . Anemia   . Arthritis   . Asthma   . Atrial fibrillation ([place])   . CHF (congestive heart failure) ([place])   . COPD (chronic obstructive pulmonary disease) ([place])   . History of nuclear stress test 2017   intermediate risk  . History of shingles   . HOH (hard of hearing)   . Mitral stenosis   . Neuralgia   . On home O2    3L N/C    Past Surgical History:  Procedure Laterality Date  . ABDOMINAL HYSTERECTOMY    . BIOPSY  08/04/2017   Procedure: BIOPSY;  Surgeon: Danie Binder, MD;   [facility];  Service: Endoscopy;;  duodenal gastric  . CHOLECYSTECTOMY    . COLONOSCOPY WITH PROPOFOL N/A 08/04/2017   Procedure: COLONOSCOPY WITH PROPOFOL;  Surgeon: Danie Binder, MD;  [facility];  Service: Endoscopy;  Laterality: N/A;  8:30am  . ESOPHAGOGASTRODUODENOSCOPY (EGD) WITH PROPOFOL N/A 08/04/2017   Procedure: ESOPHAGOGASTRODUODENOSCOPY (EGD) WITH PROPOFOL;  Surgeon: Danie Binder, MD;  [facility];  Service: Endoscopy;  Laterality: N/A;  . FASCIECTOMY Right 10/17/2014   Procedure: FASCIECTOMY RIGHT RING/SMALL FINGERS;  Surgeon: Daryll Brod, MD;  Location: [place];  Service: Orthopedics;  Laterality: Right;  . KNEE ARTHROSCOPY     left  . POLYPECTOMY  08/04/2017   Procedure: POLYPECTOMY;  Surgeon: Danie Binder, MD;  [facility];  Service: Endoscopy;;  colon  . TUBAL LIGATION    . TYMPANOSTOMY TUBE PLACEMENT     both ears    Current Outpatient Medications  Medication Sig Dispense Refill  . albuterol (PROVENTIL HFA;VENTOLIN HFA) 108 (90 BASE) MCG/ACT inhaler Inhale into the lungs every 6 (six) hours as needed for wheezing or shortness of breath.    Marland Kitchen albuterol (PROVENTIL) (2.5 MG/3ML) 0.083% nebulizer solution Take 2.5 mg by nebulization every 6 (six) hours as needed for wheezing or shortness of breath.    Marland Kitchen aspirin EC 81 MG tablet Take 81 mg by mouth daily. Swallow whole.    . calcium  carbonate (OS-CAL) 600 MG TABS tablet Take 600 mg by mouth. Doesn't take everyday    . cholecalciferol (VITAMIN D) 1000 UNITS tablet Take 1,000 Units by mouth daily.    Marland Kitchen diltiazem (CARDIZEM CD) 300 MG 24 hr capsule Take 1 capsule (300 mg total) by mouth daily.    Marland Kitchen esomeprazole (NEXIUM) 40 MG capsule 1 PO 30 MINS PRIOR TO FIRST MEAL 30 capsule 11  . furosemide (LASIX) 40 MG tablet Take 1 tablet (40 mg total) by mouth 2 (two) times daily.    Marland Kitchen gabapentin (NEURONTIN) 400 MG capsule Take 400 mg by mouth at bedtime.    .  hydrOXYzine (ATARAX/VISTARIL) 25 MG tablet Take 25 mg by mouth at bedtime.    Marland Kitchen MAGNESIUM PO Take by mouth. Twice a week    . metoprolol tartrate (LOPRESSOR) 25 MG tablet Take 0.5 tablets (12.5 mg total) by mouth 2 (two) times daily. 30 tablet 6  . montelukast (SINGULAIR) 10 MG tablet Take 10 mg by mouth at bedtime.    . vitamin B-12 (CYANOCOBALAMIN) 1000 MCG tablet Take 1,000 mcg by mouth daily.    . vitamin C (ASCORBIC ACID) 500 MG tablet Take 500 mg by mouth daily.     No current facility-administered medications for this visit.   Allergies:  Penicillins   Social History: The patient  reports that she quit smoking about 33 years ago. Her smoking use included cigarettes. She has a 2.50 pack-year smoking history. She has never used smokeless tobacco. She reports that she does not drink alcohol and does not use drugs.   Family History: The patient's family history includes Colon polyps in her sister; Kidney cancer in her mother.   ROS:  Please see the history of present illness. Otherwise, complete review of systems is positive for none.  All other systems are reviewed and negative.   Physical Exam: VS:  BP (!) 118/52   Pulse 80   Ht 5\' 8"  (1.727 m)   Wt 189 lb (85.7 kg)   SpO2 97% Comment: on 3L O2 via [place]  BMI 28.74 kg/m , BMI Body mass index is 28.74 kg/m.  Wt Readings from Last 3 Encounters:  08/17/20 189 lb (85.7 kg)  05/29/20 191 lb (86.6 kg)  04/27/20 196 lb (88.9 kg)    General: Patient appears comfortable at rest. Neck: Supple, no elevated JVP or carotid bruits, no thyromegaly. Lungs: Fine crackles heard in posterior and anterior lung bases bilaterally, nonlabored breathing at rest. Cardiac: Irregularly irregular rate and rhythm, no S3 or significant systolic murmur, no pericardial rub. Extremities: No pitting edema, distal pulses 2+. Skin: Warm and dry. Musculoskeletal: No kyphosis. Neuropsychiatric: Alert and oriented x3, affect grossly appropriate.  ECG:  An ECG  dated 05/29/2020 was personally reviewed today and demonstrated:  Atrial fibrillation with premature ventricular complexes or aberrantly conducted complexes rate of 99.  Cannot rule out anterior infarct, age undetermined.  Recent Labwork: No results found for requested labs within last 8760 hours.  No results found for: CHOL, TRIG, HDL, CHOLHDL, VLDL, LDLCALC, LDLDIRECT  Recent lab work from PCP office on 04/27/2020: BUN 25, creatinine 1.92, alkaline phosphatase 142.  Hemoglobin 8.6, hematocrit 27.5.   Other Studies Reviewed Today:  Echocardiogram [facility] 10/24/2019 Summary 1. The left ventricle is normal in size with normal wall thickness. 2. The left ventricular systolic function is borderline, LVEF is visually estimated at 50%. 3. The mitral valve leaflets are mildly thickened with mildly reduced leaflet mobility. 4. There is mild mitral  valve regurgitation and minimal stenosis as below. 5. The left atrium is moderately to severely dilated in size. 6. The right ventricle is normal in size, with normal systolic function.   Left Ventricle The left ventricle is normal in size with normal wall thickness. The left ventricular systolic function is borderline, LVEF is visually estimated at 50%. Left ventricular diastolic function cannot be accurately assessed.  Right Ventricle The right ventricle is normal in size, with normal systolic function.   Left Atrium The left atrium is moderately to severely dilated in size.  Right Atrium The right atrium is normal in size.   Aortic Valve The aortic valve is trileaflet with normal appearing leaflets with normal excursion. There is no significant aortic regurgitation. There is no evidence of a significant transvalvular gradient.  Pulmonic Valve The pulmonic valve is normal. There is no significant pulmonic regurgitation. There is no evidence of a significant transvalvular gradient.  Mitral Valve The mitral valve leaflets  are mildly thickened with mildly reduced leaflet mobility. Mitral annular calcification is present (mild). There is mild mitral valve regurgitation and minimal stenosis as below. Posterior leaflet is restricted but with minimal mitral valve stenosis (mean gradient 4-5 mmHg at HR 95 bpm, MVA 2.66 cm2).  Tricuspid Valve The tricuspid valve leaflets are poorly visualized but probably normal, with normal leaflet mobility. There is mild tricuspid regurgitation. There is no pulmonary hypertension, estimated pulmonary artery systolic pressure is 36 mmHg. Other Findings Rhythm: Atrial Fibrillation. Pericardium/Pleural There is no pericardial effusion. Inferior Vena Cava The IVC is suboptimally visualized but probably suggests normal right atrial pressure.  Aorta  The aorta is normal in size in the visualized segments.   Assessment and Plan:   1. Acute on chronic diastolic heart failure ([place]) Recent admission to [facility] with acute diastolic heart failure.  Echocardiogram 10/24/2019 at [facility].  LVEF 50%.  Mild MR and minimal mitral stenosis.  LA moderate to severely dilated.    No recent increase in chronic dyspnea or weight gain.  No PND, no orthopnea.  No lower extremity edema.  Continue Lasix 40 mg p.o. twice daily.   2. Paroxysmal atrial fibrillation ([facility]) Heart rate is irregularly irregular at 80.  Continue diltiazem 300 mg p.o. daily.  Continue metoprolol 12.5 mg p.o. twice daily.  Continue aspirin 81 mg daily.  3. History of mitral valve stenosis Echocardiogram 10/24/2019 showed mild mitral valve regurgitation and minimal stenosis.  Posterior leaflet is restricted but with minimal mitral valve stenosis (mean gradient of 45 mmHg at heart rate of 95, MVA 2.66 cm  4.  Anemia Recent hemoglobin 8.6 and hematocrit 27.9 at recent hospital visit on July 17, 2020.  Recent lab work from PCP office demonstrates hemoglobin of 8.8 and hematocrit of 28.4.  5.  CKD stage  III Recent creatinine 1.68 with GFR 27.  PCP called recently and stated he is referred her to nephrology.  We had previously referred her to Dr. Theador Hawthorne but apparently the patient never saw him. PCP recently referred to nephrology.  Recent BUN and creatinine at PCP office on 04/13/2020 creatinine of 1.71 and GFR 27.  She has a pending appointment with Dr. Theador Hawthorne.  Medication Adjustments/Labs and Tests Ordered: Current medicines are reviewed at length with the patient today.  Concerns regarding medicines are outlined above.   Disposition: Follow-up with Dr. Harl Bowie or APP 6 months  Signed, Levell July, NP 08/17/2020 1:25 PM    [facility] at [place], [place],  [place] [address] Phone: [phone]; Fax: [phone]

## 2020-08-17 ENCOUNTER — Encounter: Payer: Self-pay | Admitting: Family Medicine

## 2020-08-17 ENCOUNTER — Ambulatory Visit: Payer: Medicare PPO | Admitting: Family Medicine

## 2020-08-17 VITALS — BP 118/52 | HR 80 | Ht 68.0 in | Wt 189.0 lb

## 2020-08-17 DIAGNOSIS — I480 Paroxysmal atrial fibrillation: Secondary | ICD-10-CM | POA: Diagnosis not present

## 2020-08-17 DIAGNOSIS — Z8679 Personal history of other diseases of the circulatory system: Secondary | ICD-10-CM | POA: Diagnosis not present

## 2020-08-17 DIAGNOSIS — D649 Anemia, unspecified: Secondary | ICD-10-CM

## 2020-08-17 DIAGNOSIS — N183 Chronic kidney disease, stage 3 unspecified: Secondary | ICD-10-CM

## 2020-08-17 DIAGNOSIS — I5033 Acute on chronic diastolic (congestive) heart failure: Secondary | ICD-10-CM | POA: Diagnosis not present

## 2020-08-17 MED ORDER — DILTIAZEM HCL ER COATED BEADS 300 MG PO CP24: 300.0000 mg | ORAL_CAPSULE | Freq: Every day | ORAL | 3 refills | Status: DC

## 2020-08-17 NOTE — Patient Instructions (Signed)

## 2020-08-24 ENCOUNTER — Other Ambulatory Visit (HOSPITAL_COMMUNITY): Payer: Self-pay | Admitting: Nephrology

## 2020-08-24 DIAGNOSIS — N184 Chronic kidney disease, stage 4 (severe): Secondary | ICD-10-CM

## 2020-08-24 DIAGNOSIS — I129 Hypertensive chronic kidney disease with stage 1 through stage 4 chronic kidney disease, or unspecified chronic kidney disease: Secondary | ICD-10-CM

## 2020-09-03 ENCOUNTER — Encounter (HOSPITAL_COMMUNITY): Payer: Self-pay

## 2020-09-03 ENCOUNTER — Ambulatory Visit (HOSPITAL_COMMUNITY): Payer: Medicare PPO | Attending: Nephrology

## 2020-09-14 ENCOUNTER — Other Ambulatory Visit: Payer: Self-pay

## 2020-09-14 ENCOUNTER — Ambulatory Visit (HOSPITAL_COMMUNITY)
Admission: RE | Admit: 2020-09-14 | Discharge: 2020-09-14 | Disposition: A | Discharge status: Home / Self Care | Payer: Medicare PPO | Source: Ambulatory Visit | Attending: Nephrology | Admitting: Nephrology

## 2020-09-14 DIAGNOSIS — N184 Chronic kidney disease, stage 4 (severe): Secondary | ICD-10-CM | POA: Diagnosis not present

## 2020-09-14 DIAGNOSIS — I129 Hypertensive chronic kidney disease with stage 1 through stage 4 chronic kidney disease, or unspecified chronic kidney disease: Secondary | ICD-10-CM

## 2020-10-17 ENCOUNTER — Other Ambulatory Visit: Payer: Self-pay

## 2020-10-17 ENCOUNTER — Emergency Department (HOSPITAL_COMMUNITY): Payer: Medicare PPO

## 2020-10-17 ENCOUNTER — Emergency Department (HOSPITAL_COMMUNITY)
Admission: EM | Admit: 2020-10-17 | Discharge: 2020-10-17 | Disposition: A | Discharge status: Home / Self Care | Payer: Medicare PPO | Attending: Emergency Medicine | Admitting: Emergency Medicine

## 2020-10-17 ENCOUNTER — Encounter (HOSPITAL_COMMUNITY): Payer: Self-pay

## 2020-10-17 DIAGNOSIS — J45909 Unspecified asthma, uncomplicated: Secondary | ICD-10-CM | POA: Insufficient documentation

## 2020-10-17 DIAGNOSIS — Z87891 Personal history of nicotine dependence: Secondary | ICD-10-CM | POA: Insufficient documentation

## 2020-10-17 DIAGNOSIS — R799 Abnormal finding of blood chemistry, unspecified: Secondary | ICD-10-CM | POA: Diagnosis present

## 2020-10-17 DIAGNOSIS — K644 Residual hemorrhoidal skin tags: Secondary | ICD-10-CM | POA: Insufficient documentation

## 2020-10-17 DIAGNOSIS — R0602 Shortness of breath: Secondary | ICD-10-CM | POA: Diagnosis not present

## 2020-10-17 DIAGNOSIS — J811 Chronic pulmonary edema: Secondary | ICD-10-CM | POA: Diagnosis not present

## 2020-10-17 DIAGNOSIS — N1832 Chronic kidney disease, stage 3b: Secondary | ICD-10-CM | POA: Diagnosis not present

## 2020-10-17 DIAGNOSIS — I509 Heart failure, unspecified: Secondary | ICD-10-CM | POA: Diagnosis not present

## 2020-10-17 DIAGNOSIS — D508 Other iron deficiency anemias: Secondary | ICD-10-CM | POA: Diagnosis not present

## 2020-10-17 DIAGNOSIS — Z7982 Long term (current) use of aspirin: Secondary | ICD-10-CM | POA: Insufficient documentation

## 2020-10-17 DIAGNOSIS — D649 Anemia, unspecified: Secondary | ICD-10-CM | POA: Diagnosis not present

## 2020-10-17 DIAGNOSIS — E211 Secondary hyperparathyroidism, not elsewhere classified: Secondary | ICD-10-CM | POA: Diagnosis not present

## 2020-10-17 DIAGNOSIS — I110 Hypertensive heart disease with heart failure: Secondary | ICD-10-CM | POA: Diagnosis not present

## 2020-10-17 DIAGNOSIS — I5032 Chronic diastolic (congestive) heart failure: Secondary | ICD-10-CM | POA: Diagnosis not present

## 2020-10-17 DIAGNOSIS — D472 Monoclonal gammopathy: Secondary | ICD-10-CM | POA: Diagnosis not present

## 2020-10-17 DIAGNOSIS — R600 Localized edema: Secondary | ICD-10-CM | POA: Diagnosis not present

## 2020-10-17 DIAGNOSIS — R0603 Acute respiratory distress: Secondary | ICD-10-CM | POA: Diagnosis not present

## 2020-10-17 DIAGNOSIS — J810 Acute pulmonary edema: Secondary | ICD-10-CM | POA: Diagnosis not present

## 2020-10-17 DIAGNOSIS — D638 Anemia in other chronic diseases classified elsewhere: Secondary | ICD-10-CM | POA: Diagnosis not present

## 2020-10-17 DIAGNOSIS — J449 Chronic obstructive pulmonary disease, unspecified: Secondary | ICD-10-CM | POA: Insufficient documentation

## 2020-10-17 DIAGNOSIS — I129 Hypertensive chronic kidney disease with stage 1 through stage 4 chronic kidney disease, or unspecified chronic kidney disease: Secondary | ICD-10-CM | POA: Diagnosis not present

## 2020-10-17 DIAGNOSIS — J90 Pleural effusion, not elsewhere classified: Secondary | ICD-10-CM | POA: Diagnosis not present

## 2020-10-17 LAB — CBC WITH DIFFERENTIAL/PLATELET
Abs Immature Granulocytes: 0.04 10*3/uL (ref 0.00–0.07)
Basophils Absolute: 0.1 10*3/uL (ref 0.0–0.1)
Basophils Relative: 1 %
Eosinophils Absolute: 0.8 10*3/uL — ABNORMAL HIGH (ref 0.0–0.5)
Eosinophils Relative: 7 %
HCT: 29.7 % — ABNORMAL LOW (ref 36.0–46.0)
Hemoglobin: 8.6 g/dL — ABNORMAL LOW (ref 12.0–15.0)
Immature Granulocytes: 0 %
Lymphocytes Relative: 13 %
Lymphs Abs: 1.5 10*3/uL (ref 0.7–4.0)
MCH: 25.1 pg — ABNORMAL LOW (ref 26.0–34.0)
MCHC: 29 g/dL — ABNORMAL LOW (ref 30.0–36.0)
MCV: 86.6 fL (ref 80.0–100.0)
Monocytes Absolute: 0.5 10*3/uL (ref 0.1–1.0)
Monocytes Relative: 5 %
Neutro Abs: 8.3 10*3/uL — ABNORMAL HIGH (ref 1.7–7.7)
Neutrophils Relative %: 74 %
Platelets: 292 10*3/uL (ref 150–400)
RBC: 3.43 MIL/uL — ABNORMAL LOW (ref 3.87–5.11)
RDW: 16.8 % — ABNORMAL HIGH (ref 11.5–15.5)
WBC: 11.2 10*3/uL — ABNORMAL HIGH (ref 4.0–10.5)
nRBC: 0 % (ref 0.0–0.2)

## 2020-10-17 LAB — COMPREHENSIVE METABOLIC PANEL
ALT: 14 U/L (ref 0–44)
AST: 23 U/L (ref 15–41)
Albumin: 4 g/dL (ref 3.5–5.0)
Alkaline Phosphatase: 121 U/L (ref 38–126)
Anion gap: 10 (ref 5–15)
BUN: 23 mg/dL (ref 8–23)
CO2: 26 mmol/L (ref 22–32)
Calcium: 9.3 mg/dL (ref 8.9–10.3)
Chloride: 105 mmol/L (ref 98–111)
Creatinine, Ser: 1.44 mg/dL — ABNORMAL HIGH (ref 0.44–1.00)
GFR, Estimated: 35 mL/min — ABNORMAL LOW (ref >60)
Glucose, Bld: 113 mg/dL — ABNORMAL HIGH (ref 70–99)
Potassium: 4.2 mmol/L (ref 3.5–5.1)
Sodium: 141 mmol/L (ref 135–145)
Total Bilirubin: 0.6 mg/dL (ref 0.3–1.2)
Total Protein: 7.8 g/dL (ref 6.5–8.1)

## 2020-10-17 LAB — BRAIN NATRIURETIC PEPTIDE: B Natriuretic Peptide: 379 pg/mL — ABNORMAL HIGH (ref 0.0–100.0)

## 2020-10-17 LAB — MAGNESIUM: Magnesium: 2.1 mg/dL (ref 1.7–2.4)

## 2020-10-17 MED ORDER — POTASSIUM CHLORIDE CRYS ER 20 MEQ PO TBCR: 20.0000 meq | EXTENDED_RELEASE_TABLET | Freq: Every day | ORAL | 0 refills | Status: DC

## 2020-10-17 MED ORDER — FUROSEMIDE 40 MG PO TABS
40.0000 mg | ORAL_TABLET | Freq: Once | ORAL | Status: AC
  Administered 2020-10-17: 40 mg via ORAL
  Filled 2020-10-17: qty 1

## 2020-10-17 NOTE — Discharge Instructions (Addendum)
Your work-up today was reassuring.  Your hemoglobin was essentially the same as February.  You have some mild fluid buildup.  I would recommend that you take your Lasix 1 tablet twice a day for 3 days then take 1 tablet daily.  You have also been prescribed a potassium supplement to take as well.  Follow-up with your primary care provider next week for recheck.  Return to the emergency department if you develop any worsening symptoms.

## 2020-10-17 NOTE — ED Provider Notes (Signed)
[facility] EMERGENCY DEPARTMENT Provider Note   CSN: [id number] Arrival date & time: 10/17/20  1418     History Chief Complaint  Patient presents with  . Abnormal Lab    Amber Hull is a 87 y.o. female.  HPI      Amber Hull is a 87 y.o. female past medical history of anemia, asthma, COPD, CHF, and atrial fibrillation.  She is on continuous home oxygen at 4 L. she presents to the Emergency Department complaining under the advisement of her nephrologist for further evaluation of possible respiratory failure and possible pulmonary effusion.  She was seen at the nephrologist office earlier today and noted to be hypertensive and tachycardic with O2 sat at 89%.  She had labs and renal ultrasound in February that showed a hemoglobin of 8.4 and renal ultrasound showed a right pleural effusion.  Patient endorses having increased shortness of breath for several days despite continuous oxygen.  States her primary care provider stopped her furosemide, but she admits that she takes it occasionally when she feels that her legs are swelling.  Denies chest pain, nausea, vomiting, dizziness and abdominal pain.  Patient unsure why she is here.    Past Medical History:  Diagnosis Date  . Anemia   . Arthritis   . Asthma   . Atrial fibrillation ([place])   . CHF (congestive heart failure) ([place])   . COPD (chronic obstructive pulmonary disease) ([place])   . History of nuclear stress test 2017   intermediate risk  . History of shingles   . HOH (hard of hearing)   . Mitral stenosis   . Neuralgia   . On home O2    3L N/C    Patient Active Problem List   Diagnosis Date Noted  . Normocytic anemia 05/14/2017  . Atrial fibrillation with RVR ([place]) 05/14/2017  . Chronic respiratory failure with hypoxia ([place]) 05/14/2017  . SOB (shortness of breath) 05/14/2017  . CHF (congestive heart failure) ([place])   . COPD (chronic obstructive pulmonary disease) ([place])   . INSOMNIA 03/06/2009  .  OVERWEIGHT 12/05/2008  . POSTHERPETIC NEURALGIA 09/14/2008  . SHINGLES 09/14/2008  . ALLERGIC RHINITIS 09/14/2008  . GERD 09/14/2008  . ARTHRITIS 09/14/2008    Past Surgical History:  Procedure Laterality Date  . ABDOMINAL HYSTERECTOMY    . BIOPSY  08/04/2017   Procedure: BIOPSY;  Surgeon: Danie Binder, MD;  [facility];  Service: Endoscopy;;  duodenal gastric  . CHOLECYSTECTOMY    . COLONOSCOPY WITH PROPOFOL N/A 08/04/2017   Procedure: COLONOSCOPY WITH PROPOFOL;  Surgeon: Danie Binder, MD;  [facility];  Service: Endoscopy;  Laterality: N/A;  8:30am  . ESOPHAGOGASTRODUODENOSCOPY (EGD) WITH PROPOFOL N/A 08/04/2017   Procedure: ESOPHAGOGASTRODUODENOSCOPY (EGD) WITH PROPOFOL;  Surgeon: Danie Binder, MD;  [facility];  Service: Endoscopy;  Laterality: N/A;  . FASCIECTOMY Right 10/17/2014   Procedure: FASCIECTOMY RIGHT RING/SMALL FINGERS;  Surgeon: Daryll Brod, MD;  Location: [place];  Service: Orthopedics;  Laterality: Right;  . KNEE ARTHROSCOPY     left  . POLYPECTOMY  08/04/2017   Procedure: POLYPECTOMY;  Surgeon: Danie Binder, MD;  [facility];  Service: Endoscopy;;  colon  . TUBAL LIGATION    . TYMPANOSTOMY TUBE PLACEMENT     both ears     OB History   No obstetric history on file.     Family History  Problem Relation Age of Onset  . Kidney  cancer Mother   . Colon polyps Sister   . Colon cancer Neg Hx     Social History   Tobacco Use  . Smoking status: Former Smoker    Packs/day: 0.25    Years: 10.00    Pack years: 2.50    Types: Cigarettes    Quit date: 10/10/1986    Years since quitting: 34.0  . Smokeless tobacco: Never Used  Vaping Use  . Vaping Use: Never used  Substance Use Topics  . Alcohol use: No  . Drug use: No    Home Medications Prior to Admission medications   Medication Sig Start Date End Date Taking? Authorizing Provider  albuterol (PROVENTIL HFA;VENTOLIN HFA) 108 (90  BASE) MCG/ACT inhaler Inhale into the lungs every 6 (six) hours as needed for wheezing or shortness of breath.    [provider]  albuterol (PROVENTIL) (2.5 MG/3ML) 0.083% nebulizer solution Take 2.5 mg by nebulization every 6 (six) hours as needed for wheezing or shortness of breath.    [provider]  aspirin EC 81 MG tablet Take 81 mg by mouth daily. Swallow whole.    [provider]  calcium carbonate (OS-CAL) 600 MG TABS tablet Take 600 mg by mouth. Doesn't take everyday    [provider]  cholecalciferol (VITAMIN D) 1000 UNITS tablet Take 1,000 Units by mouth daily.    [provider]  diltiazem (CARDIZEM CD) 300 MG 24 hr capsule Take 1 capsule (300 mg total) by mouth daily. 08/17/20   Verta Ellen., NP  esomeprazole (NEXIUM) 40 MG capsule 1 PO 30 MINS PRIOR TO FIRST MEAL 08/04/17   Fields, Marga Melnick, MD  furosemide (LASIX) 40 MG tablet Take 1 tablet (40 mg total) by mouth 2 (two) times daily. 06/13/20   Verta Ellen., NP  gabapentin (NEURONTIN) 400 MG capsule Take 400 mg by mouth at bedtime.    [provider]  hydrOXYzine (ATARAX/VISTARIL) 25 MG tablet Take 25 mg by mouth at bedtime.    [provider]  MAGNESIUM PO Take by mouth. Twice a week    [provider]  metoprolol tartrate (LOPRESSOR) 25 MG tablet Take 0.5 tablets (12.5 mg total) by mouth 2 (two) times daily. 05/29/20 08/27/20  Verta Ellen., NP  montelukast (SINGULAIR) 10 MG tablet Take 10 mg by mouth at bedtime.    [provider]  vitamin B-12 (CYANOCOBALAMIN) 1000 MCG tablet Take 1,000 mcg by mouth daily.    [provider]  vitamin C (ASCORBIC ACID) 500 MG tablet Take 500 mg by mouth daily.    [provider]    Allergies    Penicillins  Review of Systems   Review of Systems  Constitutional: Negative for chills, fatigue and fever.  HENT: Negative for trouble swallowing.   Respiratory: Positive for shortness  of breath. Negative for cough, chest tightness and wheezing.   Cardiovascular: Negative for chest pain, palpitations and leg swelling.  Gastrointestinal: Negative for abdominal pain, blood in stool, nausea and vomiting.  Genitourinary: Negative for decreased urine volume, dysuria, flank pain and hematuria.  Musculoskeletal: Negative for arthralgias, back pain, myalgias, neck pain and neck stiffness.  Skin: Negative for rash.  Neurological: Negative for dizziness, syncope, speech difficulty, weakness, numbness and headaches.  Hematological: Does not bruise/bleed easily.    Physical Exam Updated Vital Signs BP (!) 161/56   Pulse 92   Temp 98.4 F (36.9 C) (Oral)   Resp (!) 25   Ht 5'  8" (1.727 m)   Wt 84.8 kg   SpO2 100%   BMI 28.43 kg/m   Physical Exam Vitals and nursing note reviewed.  Constitutional:      General: She is not in acute distress.    Appearance: Normal appearance. She is not ill-appearing.  HENT:     Mouth/Throat:     Mouth: Mucous membranes are dry.  Eyes:     Extraocular Movements: Extraocular movements intact.     Conjunctiva/sclera: Conjunctivae normal.  Cardiovascular:     Rate and Rhythm: Normal rate and regular rhythm.     Pulses: Normal pulses.  Pulmonary:     Effort: Pulmonary effort is normal.  Chest:     Chest wall: No tenderness.  Abdominal:     Palpations: Abdomen is soft.     Tenderness: There is no abdominal tenderness.  Genitourinary:    Rectum: Guaiac result negative. External hemorrhoid present. No mass, tenderness or anal fissure.     Comments: Digital rectal exam by me.  Brown, heme negative stool, without palpable rectal masses.  Musculoskeletal:     Cervical back: Normal range of motion.     Right lower leg: No edema.     Left lower leg: No edema.  Skin:    General: Skin is warm.     Capillary Refill: Capillary refill takes less than 2 seconds.     Findings: No erythema or rash.  Neurological:     General: No focal deficit  present.     Mental Status: She is alert.     GCS: GCS eye subscore is 4. GCS verbal subscore is 5. GCS motor subscore is 6.     Sensory: Sensation is intact. No sensory deficit.     Motor: Motor function is intact. No weakness.     Comments: CN II-XXII grossly intact.  Speech clear.      ED Results / Procedures / Treatments   Labs (all labs ordered are listed, but only abnormal results are displayed) Labs Reviewed  BRAIN NATRIURETIC PEPTIDE - Abnormal; Notable for the following components:      Result Value   B Natriuretic Peptide 379.0 (*)    All other components within normal limits  COMPREHENSIVE METABOLIC PANEL - Abnormal; Notable for the following components:   Glucose, Bld 113 (*)    Creatinine, Ser 1.44 (*)    GFR, Estimated 35 (*)    All other components within normal limits  CBC WITH DIFFERENTIAL/PLATELET - Abnormal; Notable for the following components:   WBC 11.2 (*)    RBC 3.43 (*)    Hemoglobin 8.6 (*)    HCT 29.7 (*)    MCH 25.1 (*)    MCHC 29.0 (*)    RDW 16.8 (*)    Neutro Abs 8.3 (*)    Eosinophils Absolute 0.8 (*)    All other components within normal limits  MAGNESIUM    EKG EKG Interpretation  Date/Time:  Wednesday October 17 2020 15:07:31 EDT Ventricular Rate:  87 PR Interval:    QRS Duration: 92 QT Interval:  408 QTC Calculation: 494 R Axis:   12 Text Interpretation: Atrial fibrillation Borderline prolonged QT interval No significant change since prior 11/18 Confirmed by Aletta Edouard [phone]) on 10/17/2020 3:13:00 PM   Radiology DG Chest Portable 1 View  Result Date: 10/17/2020 CLINICAL DATA:  Shortness of breath, anemia, low oxygen saturation EXAM: PORTABLE CHEST 1 VIEW COMPARISON:  Portable exam 1547 hours compared to 07/17/2020 FINDINGS: Normal heart size and  mediastinal contours. Diffuse interstitial infiltrates in both lungs, may represent pulmonary edema or atypical infection. No pleural effusion or pneumothorax. Mild elevation of RIGHT  diaphragm again seen. Bones demineralized. IMPRESSION: Diffuse interstitial infiltrates bilaterally question pulmonary edema versus atypical infection, little changed versus previous study. Electronically Signed   By: Lavonia Dana M.D.   On: 10/17/2020 16:14    Procedures Procedures   Medications Ordered in ED Medications - No data to display  ED Course  I have reviewed the triage vital signs and the nursing notes.  Pertinent labs & imaging results that were available during my care of the patient were reviewed by me and considered in my medical decision making (see chart for details).  Clinical Course as of 10/17/20 1757  Wed Oct 17, 4617  6220 87 year old female with history of CKD, CHF, anemia brought in by daughter after saw nephrology today and her labs were off.  Hemoglobin is down a point from last one.  Heme-negative from below.  Creatinine slightly elevated.  BNP stable.  Currently does not have any symptoms and so do not feel she needs admission or transfusion.  Recommended close PCP follow-up. [MB]    Clinical Course User Index [MB] Hayden Rasmussen, MD   MDM Rules/Calculators/A&P                          Patient here under advisement of her nephrologist for further evaluation of possible pulmonary effusion and anemia.  Patient endorses some shortness of breath for several days despite oxygen use.  No dyspnea at this time, no hypoxia here.  Denies any chest pain, dizziness, abdominal pain, dark/bloody stools.    On recheck, patient resting comfortably.  Requesting food.   EKG shows atrial fibrillation without RVR. Hx of atrial fib.  BNP 379 improved from previous 3 years ago.  Magnesium unremarkable, serum creatinine slightly elevated from baseline.  No hypokalemia.  Hemoglobin reported from nephrologist office at 8.4 in February it is 8.6 today and she has heme-negative stool on rectal exam.  History of anemia.  CXR chest x-ray shows possible  pulmonary edema, but little  change from previous. No hypoxia here and pt is asymptomatic   Pt seen by Dr. Melina Copa and care plan discussed.  Will have pt take her furosemide one tab twice daily x 3 days, then one tab daily.  Close PCP f/u within the week.  Strict return precautions given.    Final Clinical Impression(s) / ED Diagnoses Final diagnoses:  Chronic pulmonary edema    Rx / DC Orders ED Discharge Orders    None       Kem Parkinson, PA-C 10/18/20 1042    Hayden Rasmussen, MD 10/18/20 1218

## 2020-10-17 NOTE — ED Triage Notes (Addendum)
Pt to er, pt states that she doesn't know why she is here, pt has a piece of paper from her doctor that says her hemoglobin is 8.4, she has fluid around her lungs and had a low O2 sat. Pt on 4L O2 via [place], pt presents to er on 4L  Pt satting 100%

## 2020-10-31 DIAGNOSIS — R0600 Dyspnea, unspecified: Secondary | ICD-10-CM | POA: Diagnosis not present

## 2020-11-01 DIAGNOSIS — J449 Chronic obstructive pulmonary disease, unspecified: Secondary | ICD-10-CM | POA: Diagnosis not present

## 2020-11-01 NOTE — Progress Notes (Signed)
Cardiology Office Note  Date: 11/02/2020   ID: Kenzly, Rogoff [birth year], MRN [id number]  PCP:  [place] Nation, MD  Cardiologist:  Carlyle Dolly, MD Electrophysiologist:  None   Chief Complaint: Cardiac follow up  History of Present Illness: Amber Hull is a 87 y.o. female with a history of atrial fibrillation, diastolic HF, COPD, mitral stenosis, anemia, CKD III.  Recent ED visit on 10/17/2020 to [facility]  She was brought into the emergency room by daughter after seeing nephrology and her labs were off.  She was advised to come to the emergency room by nephrology due to being hypertensive and tachycardic with O2 saturations at 89%.  Her hemoglobin was down from last labs.  She had a negative fecal occult hemoglobin test.  Creatinine was slightly elevated.  BNP was stable.  She was currently not having any symptoms and provider did not feel she needed admission for transfusion.  Recommended close follow-up with PCP. Creatinine was 1.44, glucose 113, GFR 35, hemoglobin 8.6, hematocrit 29.7.  BNP was 379.  She was given furosemide p.o. 40 mg x 1 dose.  Patient had mentioned to ED provider her primary care provider had stopped her furosemide.  However, she admitted to taking it occasionally when feeling that her legs were swelling.  Patient stated she was unsure of why she was there.  ED provider instructed patient to take her Lasix 1 tab twice daily x3 days then revert back to 1 tab daily.  She was discharged.   She is here today for ED follow-up.  She denies any issues with shortness of breath.  Blood pressure is within normal limits at 132/60.  Heart rate is 82 and irregularly irregular.  She is currently on nasal O2 at 2 L/min.  O2 saturation is 99%.  She is in no acute distress.  Recently saw Dr. Theador Hawthorne for CKD stage IIIb/IV.  His plans are to start IV iron infusions along with Epogen injections for anemia of chronic disease/IDA.  She has MGUS and his plans are to  refer to hematology.  She is here today with a friend.  She currently denies any issues with tachycardia or shortness of breath.  No complaints of palpitations or arrhythmias, orthostatic symptoms, CVA or TIA-like symptoms, PND, orthopnea, bleeding.  Her friend states she had a fall recently and follow-up with Dr. Linde Gillis pulmonology [place].  She states she had a chest x-ray yesterday at [facility].  Results are not available through care everywhere.  We will have nursing staff request results.   Past Medical History:  Diagnosis Date  . Anemia   . Arthritis   . Asthma   . Atrial fibrillation ([place])   . CHF (congestive heart failure) ([place])   . COPD (chronic obstructive pulmonary disease) ([place])   . History of nuclear stress test 2017   intermediate risk  . History of shingles   . HOH (hard of hearing)   . Mitral stenosis   . Neuralgia   . On home O2    3L N/C    Past Surgical History:  Procedure Laterality Date  . ABDOMINAL HYSTERECTOMY    . BIOPSY  08/04/2017   Procedure: BIOPSY;  Surgeon: Danie Binder, MD;  [facility];  Service: Endoscopy;;  duodenal gastric  . CHOLECYSTECTOMY    . COLONOSCOPY WITH PROPOFOL N/A 08/04/2017   Procedure: COLONOSCOPY WITH PROPOFOL;  Surgeon: Danie Binder, MD;  [facility];  Service: Endoscopy;  Laterality: N/A;  8:30am  . ESOPHAGOGASTRODUODENOSCOPY (EGD) WITH PROPOFOL N/A 08/04/2017   Procedure: ESOPHAGOGASTRODUODENOSCOPY (EGD) WITH PROPOFOL;  Surgeon: Danie Binder, MD;  [facility];  Service: Endoscopy;  Laterality: N/A;  . FASCIECTOMY Right 10/17/2014   Procedure: FASCIECTOMY RIGHT RING/SMALL FINGERS;  Surgeon: Daryll Brod, MD;  Location: [place];  Service: Orthopedics;  Laterality: Right;  . KNEE ARTHROSCOPY     left  . POLYPECTOMY  08/04/2017   Procedure: POLYPECTOMY;  Surgeon: Danie Binder, MD;  [facility];  Service: Endoscopy;;  colon  .  TUBAL LIGATION    . TYMPANOSTOMY TUBE PLACEMENT     both ears    Current Outpatient Medications  Medication Sig Dispense Refill  . albuterol (PROVENTIL HFA;VENTOLIN HFA) 108 (90 BASE) MCG/ACT inhaler Inhale into the lungs every 6 (six) hours as needed for wheezing or shortness of breath.    Marland Kitchen albuterol (PROVENTIL) (2.5 MG/3ML) 0.083% nebulizer solution Take 2.5 mg by nebulization every 6 (six) hours as needed for wheezing or shortness of breath.    Marland Kitchen aspirin EC 81 MG tablet Take 81 mg by mouth daily. Swallow whole.    . calcitRIOL (ROCALTROL) 0.25 MCG capsule Take by mouth.    . calcium carbonate (OS-CAL) 600 MG TABS tablet Take 600 mg by mouth. Doesn't take everyday    . cholecalciferol (VITAMIN D) 1000 UNITS tablet Take 1,000 Units by mouth daily.    Marland Kitchen diltiazem (CARDIZEM CD) 300 MG 24 hr capsule Take 1 capsule (300 mg total) by mouth daily. 90 capsule 3  . diltiazem (TIAZAC) 240 MG 24 hr capsule Take 240 mg by mouth daily.    Marland Kitchen esomeprazole (NEXIUM) 40 MG capsule 1 PO 30 MINS PRIOR TO FIRST MEAL 30 capsule 11  . furosemide (LASIX) 40 MG tablet Take 1 tablet (40 mg total) by mouth 2 (two) times daily.    Marland Kitchen gabapentin (NEURONTIN) 400 MG capsule Take 400 mg by mouth 3 (three) times daily.    . hydrOXYzine (ATARAX/VISTARIL) 25 MG tablet Take 25 mg by mouth at bedtime.    Marland Kitchen MAGNESIUM PO Take by mouth. Twice a week    . metoprolol tartrate (LOPRESSOR) 25 MG tablet Take 0.5 tablets (12.5 mg total) by mouth 2 (two) times daily. 30 tablet 6  . montelukast (SINGULAIR) 10 MG tablet Take 10 mg by mouth at bedtime.    . potassium chloride SA (KLOR-CON) 20 MEQ tablet Take 1 tablet (20 mEq total) by mouth daily. 15 tablet 0  . vitamin B-12 (CYANOCOBALAMIN) 1000 MCG tablet Take 1,000 mcg by mouth daily.    . vitamin C (ASCORBIC ACID) 500 MG tablet Take 500 mg by mouth daily.     No current facility-administered medications for this visit.   Allergies:  Penicillins   Social History: The patient   reports that she quit smoking about 34 years ago. Her smoking use included cigarettes. She has a 2.50 pack-year smoking history. She has never used smokeless tobacco. She reports that she does not drink alcohol and does not use drugs.   Family History: The patient's family history includes Colon polyps in her sister; Kidney cancer in her mother.   ROS:  Please see the history of present illness. Otherwise, complete review of systems is positive for none.  All other systems are reviewed and negative.   Physical Exam: VS:  BP 132/60   Pulse 82   Ht 5\' 8"  (1.727 m)   Wt  186 lb (84.4 kg)   SpO2 99%   BMI 28.28 kg/m , BMI Body mass index is 28.28 kg/m.  Wt Readings from Last 3 Encounters:  11/02/20 186 lb (84.4 kg)  10/17/20 187 lb (84.8 kg)  08/17/20 189 lb (85.7 kg)    General: Patient appears comfortable at rest. Neck: Supple, no elevated JVP or carotid bruits, no thyromegaly. Lungs: Fine crackles heard in posterior and anterior lung bases bilaterally, nonlabored breathing at rest. Cardiac: Irregularly irregular rate and rhythm, no S3 or significant systolic murmur, no pericardial rub. Extremities: No pitting edema, distal pulses 2+. Skin: Warm and dry. Musculoskeletal: No kyphosis. Neuropsychiatric: Alert and oriented x3, affect grossly appropriate.  ECG:  An ECG dated 05/29/2020 was personally reviewed today and demonstrated:  Atrial fibrillation with premature ventricular complexes or aberrantly conducted complexes rate of 99.  Cannot rule out anterior infarct, age undetermined.  Recent Labwork: 10/17/2020: ALT 14; AST 23; B Natriuretic Peptide 379.0; BUN 23; Creatinine, Ser 1.44; Hemoglobin 8.6; Magnesium 2.1; Platelets 292; Potassium 4.2; Sodium 141  No results found for: CHOL, TRIG, HDL, CHOLHDL, VLDL, LDLCALC, LDLDIRECT  Recent lab work from PCP office on 04/27/2020: BUN 25, creatinine 1.92, alkaline phosphatase 142.  Hemoglobin 8.6, hematocrit 27.5.   Other Studies Reviewed  Today:  Echocardiogram [facility] 10/24/2019 Summary 1. The left ventricle is normal in size with normal wall thickness. 2. The left ventricular systolic function is borderline, LVEF is visually estimated at 50%. 3. The mitral valve leaflets are mildly thickened with mildly reduced leaflet mobility. 4. There is mild mitral valve regurgitation and minimal stenosis as below. 5. The left atrium is moderately to severely dilated in size. 6. The right ventricle is normal in size, with normal systolic function.   Left Ventricle The left ventricle is normal in size with normal wall thickness. The left ventricular systolic function is borderline, LVEF is visually estimated at 50%. Left ventricular diastolic function cannot be accurately assessed.  Right Ventricle The right ventricle is normal in size, with normal systolic function.   Left Atrium The left atrium is moderately to severely dilated in size.  Right Atrium The right atrium is normal in size.   Aortic Valve The aortic valve is trileaflet with normal appearing leaflets with normal excursion. There is no significant aortic regurgitation. There is no evidence of a significant transvalvular gradient.  Pulmonic Valve The pulmonic valve is normal. There is no significant pulmonic regurgitation. There is no evidence of a significant transvalvular gradient.  Mitral Valve The mitral valve leaflets are mildly thickened with mildly reduced leaflet mobility. Mitral annular calcification is present (mild). There is mild mitral valve regurgitation and minimal stenosis as below. Posterior leaflet is restricted but with minimal mitral valve stenosis (mean gradient 4-5 mmHg at HR 95 bpm, MVA 2.66 cm2).  Tricuspid Valve The tricuspid valve leaflets are poorly visualized but probably normal, with normal leaflet mobility. There is mild tricuspid regurgitation. There is no pulmonary hypertension, estimated pulmonary artery  systolic pressure is 36 mmHg. Other Findings Rhythm: Atrial Fibrillation. Pericardium/Pleural There is no pericardial effusion. Inferior Vena Cava The IVC is suboptimally visualized but probably suggests normal right atrial pressure.  Aorta  The aorta is normal in size in the visualized segments.   Assessment and Plan:   1. Acute on chronic diastolic heart failure ([place]) Recent ED visit for tachycardia, decreased O2 saturations, shortness of breath.  She received 1 dose of p.o. Lasix 40 mg at ED on 10/17/2020.  ED provider instructed her  to take her Lasix 40 mg twice a day for 3 days then revert back to 1 tablet 40 mg daily.  She is in no acute respiratory distress today.  O2 sats on 2 L nasal cannula 99%.  Advised her to take Lasix 40 mg every other day and on other days if she gains 3 pounds in 24 hours or 5 pounds in a week she may take an extra dose on those days.  Please obtain recent chest x-ray results ordered by pulmonology yesterday from [facility].  2. Paroxysmal atrial fibrillation (HCC) Heart rate is irregularly irregular at 80.  Continue diltiazem 300 mg p.o. daily.  Continue metoprolol 12.5 mg p.o. twice daily.  Continue aspirin 81 mg daily.  3. History of mitral valve stenosis Echocardiogram 10/24/2019 showed mild mitral valve regurgitation and minimal stenosis.  Posterior leaflet is restricted but with minimal mitral valve stenosis (mean gradient of 45 mmHg at heart rate of 95, MVA 2.66 cm .  Repeat echocardiogram to reevaluate mitral valve regurgitation/mitral valve stenosis, LV function, diastolic function.  4.  Anemia Recent hemoglobin 8.6 and hematocrit 27.9 at recent hospital visit on July 17, 2020.  More recent CBC on 10/17/2020 demonstrated hemoglobin of 8.6 and hematocrit of 29.7 at recent ED visit.  Nephrology plans to start IV iron infusions and Epogen injections for anemia.  His plans are to refer her to hematology for MGUS.   5.  CKD stage  III Recently saw nephrology Dr. Theador Hawthorne.  She was short of breath with O2 sats of 89% and tachycardiac with decreased hemoglobin.   He sent her to the emergency room for evaluation. She presented to [facility] on 10/17/2020.  She given Lasix 40 mg p.o. x1 dose on 10/17/2020.  ED provider instructed her to take her Lasix tablet twice daily x3 days then revert back to 1 tablet daily.  Her creatinine was 1.44 with estimated GFR at 35.  Medication Adjustments/Labs and Tests Ordered: Current medicines are reviewed at length with the patient today.  Concerns regarding medicines are outlined above.   Disposition: Follow-up with Dr. Harl Bowie or APP 6 to 8 weeks  Signed, Levell July, NP 11/02/2020 1:34 PM    [facility] [place], [place], [place] [address] Phone: [phone]; Fax: [phone]

## 2020-11-02 ENCOUNTER — Encounter: Payer: Self-pay | Admitting: Family Medicine

## 2020-11-02 ENCOUNTER — Encounter: Payer: Self-pay | Admitting: *Deleted

## 2020-11-02 ENCOUNTER — Ambulatory Visit (INDEPENDENT_AMBULATORY_CARE_PROVIDER_SITE_OTHER): Payer: Medicare PPO | Admitting: Family Medicine

## 2020-11-02 VITALS — BP 132/60 | HR 82 | Ht 68.0 in | Wt 186.0 lb

## 2020-11-02 DIAGNOSIS — I050 Rheumatic mitral stenosis: Secondary | ICD-10-CM

## 2020-11-02 DIAGNOSIS — I340 Nonrheumatic mitral (valve) insufficiency: Secondary | ICD-10-CM

## 2020-11-02 DIAGNOSIS — I5032 Chronic diastolic (congestive) heart failure: Secondary | ICD-10-CM

## 2020-11-02 MED ORDER — FUROSEMIDE 40 MG PO TABS: 40.0000 mg | ORAL_TABLET | ORAL | Status: DC

## 2020-11-02 NOTE — Patient Instructions (Addendum)
Medication Instructions:   Change your Lasix to 40mg  EVERY OTHER DAY - may take an one extra tab for weight gain of 3 lbs / 24 hours OR 5 lbs / 1 week.   Continue all other medications.    Labwork:  BMET, Mg - orders given today.   Please do in 2 weeks (around 11/16/2020).  Testing/Procedures: Your physician has requested that you have an echocardiogram. Echocardiography is a painless test that uses sound waves to create images of your heart. It provides your doctor with information about the size and shape of your heart and how well your heart's chambers and valves are working. This procedure takes approximately one hour. There are no restrictions for this procedure.  Follow-Up: 6-8 weeks   Any Other Special Instructions Will Be Listed Below (If Applicable).  If you need a refill on your cardiac medications before your next appointment, please call your pharmacy.

## 2020-11-06 ENCOUNTER — Telehealth: Payer: Self-pay | Admitting: Cardiology

## 2020-11-06 ENCOUNTER — Other Ambulatory Visit: Payer: Self-pay | Admitting: *Deleted

## 2020-11-06 DIAGNOSIS — I5032 Chronic diastolic (congestive) heart failure: Secondary | ICD-10-CM

## 2020-11-06 NOTE — Telephone Encounter (Signed)
Reports [facility] is too far to do lab work and she would like to have it done in [place]. Advised that lab orders sent to [facility].

## 2020-11-06 NOTE — Telephone Encounter (Signed)
Please call pt to review medication and where she's supposed to go to have her lab work done.

## 2020-11-12 DIAGNOSIS — M25561 Pain in right knee: Secondary | ICD-10-CM | POA: Diagnosis not present

## 2020-11-12 DIAGNOSIS — J849 Interstitial pulmonary disease, unspecified: Secondary | ICD-10-CM | POA: Diagnosis not present

## 2020-11-12 DIAGNOSIS — I4891 Unspecified atrial fibrillation: Secondary | ICD-10-CM | POA: Diagnosis not present

## 2020-11-12 DIAGNOSIS — D649 Anemia, unspecified: Secondary | ICD-10-CM | POA: Diagnosis not present

## 2020-11-12 DIAGNOSIS — I509 Heart failure, unspecified: Secondary | ICD-10-CM | POA: Diagnosis not present

## 2020-11-12 DIAGNOSIS — N184 Chronic kidney disease, stage 4 (severe): Secondary | ICD-10-CM | POA: Diagnosis not present

## 2020-11-12 DIAGNOSIS — Z6831 Body mass index (BMI) 31.0-31.9, adult: Secondary | ICD-10-CM | POA: Diagnosis not present

## 2020-11-12 DIAGNOSIS — J449 Chronic obstructive pulmonary disease, unspecified: Secondary | ICD-10-CM | POA: Diagnosis not present

## 2020-11-16 DIAGNOSIS — I509 Heart failure, unspecified: Secondary | ICD-10-CM | POA: Diagnosis not present

## 2020-11-21 DIAGNOSIS — N1832 Chronic kidney disease, stage 3b: Secondary | ICD-10-CM | POA: Diagnosis not present

## 2020-11-21 DIAGNOSIS — N170 Acute kidney failure with tubular necrosis: Secondary | ICD-10-CM | POA: Diagnosis not present

## 2020-11-21 DIAGNOSIS — I129 Hypertensive chronic kidney disease with stage 1 through stage 4 chronic kidney disease, or unspecified chronic kidney disease: Secondary | ICD-10-CM | POA: Diagnosis not present

## 2020-11-21 DIAGNOSIS — D508 Other iron deficiency anemias: Secondary | ICD-10-CM | POA: Diagnosis not present

## 2020-11-21 DIAGNOSIS — D472 Monoclonal gammopathy: Secondary | ICD-10-CM | POA: Diagnosis not present

## 2020-11-21 DIAGNOSIS — I5032 Chronic diastolic (congestive) heart failure: Secondary | ICD-10-CM | POA: Diagnosis not present

## 2020-11-21 DIAGNOSIS — E211 Secondary hyperparathyroidism, not elsewhere classified: Secondary | ICD-10-CM | POA: Diagnosis not present

## 2020-11-27 ENCOUNTER — Other Ambulatory Visit: Payer: Medicare PPO

## 2020-11-28 ENCOUNTER — Telehealth: Payer: Self-pay | Admitting: *Deleted

## 2020-11-28 NOTE — Telephone Encounter (Signed)
-----   Message from Arnoldo Lenis, MD sent at 11/27/2020  7:38 AM EDT ----- Kidney function has worsened, looks like she just saw Dr Theador Hawthorne. Did he discuss with her the labs and any changes?  J BrancH MD

## 2020-11-30 DIAGNOSIS — D500 Iron deficiency anemia secondary to blood loss (chronic): Secondary | ICD-10-CM | POA: Diagnosis not present

## 2020-11-30 DIAGNOSIS — N183 Chronic kidney disease, stage 3 unspecified: Secondary | ICD-10-CM | POA: Diagnosis not present

## 2020-12-01 DIAGNOSIS — J449 Chronic obstructive pulmonary disease, unspecified: Secondary | ICD-10-CM | POA: Diagnosis not present

## 2020-12-07 DIAGNOSIS — N183 Chronic kidney disease, stage 3 unspecified: Secondary | ICD-10-CM | POA: Diagnosis not present

## 2020-12-07 DIAGNOSIS — D500 Iron deficiency anemia secondary to blood loss (chronic): Secondary | ICD-10-CM | POA: Diagnosis not present

## 2020-12-11 ENCOUNTER — Encounter: Payer: Self-pay | Admitting: *Deleted

## 2020-12-11 NOTE — Telephone Encounter (Signed)
LM X 3 with no return phone call - will mail letter

## 2020-12-13 ENCOUNTER — Ambulatory Visit (INDEPENDENT_AMBULATORY_CARE_PROVIDER_SITE_OTHER): Payer: Medicare PPO

## 2020-12-13 DIAGNOSIS — I340 Nonrheumatic mitral (valve) insufficiency: Secondary | ICD-10-CM | POA: Diagnosis not present

## 2020-12-13 DIAGNOSIS — I050 Rheumatic mitral stenosis: Secondary | ICD-10-CM

## 2020-12-14 LAB — ECHOCARDIOGRAM COMPLETE
Area-P 1/2: 1.85 cm2
Calc EF: 59 %
MV M vel: 4.33 m/s
MV Peak grad: 75.1 mmHg
MV VTI: 1.29 cm2
S' Lateral: 3.02 cm
Single Plane A2C EF: 57.8 %
Single Plane A4C EF: 59 %

## 2020-12-18 ENCOUNTER — Telehealth: Payer: Self-pay | Admitting: *Deleted

## 2020-12-18 NOTE — Telephone Encounter (Signed)
Laurine Blazer, LPN  02/12/4234 3:61 PM EDT Back to Top     Notified, copy to pcp.    Laurine Blazer, LPN  10/15/3152 0:08 AM EDT      Left message to return call with daughter Vicente Masson).    Verta Ellen., NP  12/15/2020 1:59 PM EDT      Please call the patient and let her know the echocardiogram shows she has good pumping function of her heart. The mitral valve has some moderate leaking and moderate narrowing. Otherwise the echocardiogram looks good. Tell her we will likely start checking her echocardiograms to keep an eye on the leaking and narrowed valve. Please schedule an echocardiogram to recheck in 6 months.

## 2020-12-20 NOTE — Progress Notes (Signed)
Cardiology Office Note  Date: 12/21/2020   ID: Kinda, Pottle 24-Nov-1931, MRN [id number]  PCP:  Avera Nation, MD  Cardiologist:  Carlyle Dolly, MD Electrophysiologist:  None   Chief Complaint: Cardiac follow up  History of Present Illness: Amber Hull is a 87 y.o. female with a history of atrial fibrillation, diastolic HF, COPD, mitral stenosis, anemia, CKD III.  Recent ED visit on 10/17/2020 to [facility].  She was brought into the emergency room by daughter after seeing nephrology and her labs were off.  She was advised to come to the emergency room by nephrology due to being hypertensive and tachycardic with O2 saturations at 89%.  Her hemoglobin was down from last labs.  She had a negative fecal occult hemoglobin test.  Creatinine was slightly elevated.  BNP was stable.  She was currently not having any symptoms and provider did not feel she needed admission for transfusion.  Recommended close follow-up with PCP. Creatinine was 1.44, glucose 113, GFR 35, hemoglobin 8.6, hematocrit 29.7.  BNP was 379.  She was given furosemide p.o. 40 mg x 1 dose.  Patient had mentioned to ED provider her primary care provider had stopped her furosemide.  However, she admitted to taking it occasionally when feeling that her legs were swelling.  Patient stated she was unsure of why she was there.  ED provider instructed patient to take her Lasix 1 tab twice daily x3 days then revert back to 1 tab daily.  She was discharged.   She was last here for ED follow-up.  She denied any issues with shortness of breath.   She i was on nasal O2 at 2 L/min.  O2 saturation is 99%.  Recently saw Dr. Theador Hawthorne for CKD stage IIIb/IV.  His plans were to start IV iron infusions along with Epogen injections for anemia of chronic disease/IDA.  She has MGUS and plans were to refer to hematology.    She is here today for follow-up on echocardiogram.  She is states she is feeling much better since receiving  iron infusions.  She denies any significant shortness of breath beyond her usual.  She has COPD and is on chronic O2 via nasal cannula.  She denies any significant weight gain, swelling.  Recent echocardiogram. On 12/13/2020 demonstrated EF 60 to 65% no WMA's.  Indeterminate diastolic parameters.  RV systolic function normal.  Moderate MR, moderate MS. he continues to see Dr. Theador Hawthorne for stage III/IV CKD.  Most recent creatinine 2.16 and GFR 20.  He continues Lasix as needed.  She states she has not required any Lasix this week.   Past Medical History:  Diagnosis Date   Anemia    Arthritis    Asthma    Atrial fibrillation (HCC)    CHF (congestive heart failure) ([place])    COPD (chronic obstructive pulmonary disease) ([place])    History of nuclear stress test 2017   intermediate risk   History of shingles    HOH (hard of hearing)    Mitral stenosis    Neuralgia    On home O2    3L N/C    Past Surgical History:  Procedure Laterality Date   ABDOMINAL HYSTERECTOMY     BIOPSY  08/04/2017   Procedure: BIOPSY;  Surgeon: Danie Binder, MD;  [facility];  Service: Endoscopy;;  duodenal gastric   CHOLECYSTECTOMY     COLONOSCOPY WITH PROPOFOL N/A 08/04/2017   Procedure: COLONOSCOPY WITH PROPOFOL;  Surgeon: Oneida Alar,  Marga Melnick, MD;  [facility];  Service: Endoscopy;  Laterality: N/A;  8:30am   ESOPHAGOGASTRODUODENOSCOPY (EGD) WITH PROPOFOL N/A 08/04/2017   Procedure: ESOPHAGOGASTRODUODENOSCOPY (EGD) WITH PROPOFOL;  Surgeon: Danie Binder, MD;  [facility];  Service: Endoscopy;  Laterality: N/A;   FASCIECTOMY Right 10/17/2014   Procedure: FASCIECTOMY RIGHT RING/SMALL FINGERS;  Surgeon: Daryll Brod, MD;  Location: [place];  Service: Orthopedics;  Laterality: Right;   KNEE ARTHROSCOPY     left   POLYPECTOMY  08/04/2017   Procedure: POLYPECTOMY;  Surgeon: Danie Binder, MD;  [facility];  Service: Endoscopy;;  colon   TUBAL LIGATION      TYMPANOSTOMY TUBE PLACEMENT     both ears    Current Outpatient Medications  Medication Sig Dispense Refill   albuterol (PROVENTIL HFA;VENTOLIN HFA) 108 (90 BASE) MCG/ACT inhaler Inhale into the lungs every 6 (six) hours as needed for wheezing or shortness of breath.     albuterol (PROVENTIL) (2.5 MG/3ML) 0.083% nebulizer solution Take 2.5 mg by nebulization every 6 (six) hours as needed for wheezing or shortness of breath.     aspirin EC 81 MG tablet Take 81 mg by mouth daily. Swallow whole.     calcitRIOL (ROCALTROL) 0.25 MCG capsule Take by mouth.     calcium carbonate (OS-CAL) 600 MG TABS tablet Take 600 mg by mouth. Doesn't take everyday     cholecalciferol (VITAMIN D) 1000 UNITS tablet Take 1,000 Units by mouth daily.     diltiazem (CARDIZEM CD) 300 MG 24 hr capsule Take 1 capsule (300 mg total) by mouth daily. 90 capsule 3   diltiazem (TIAZAC) 240 MG 24 hr capsule Take 240 mg by mouth daily.     esomeprazole (NEXIUM) 40 MG capsule 1 PO 30 MINS PRIOR TO FIRST MEAL 30 capsule 11   furosemide (LASIX) 40 MG tablet Take 1 tablet (40 mg total) by mouth every other day. (May take one extra tab as needed for weight gain of 3 lbs / 24 hours OR 5 lbs / 1 week.)     gabapentin (NEURONTIN) 400 MG capsule Take 400 mg by mouth 3 (three) times daily.     hydrOXYzine (ATARAX/VISTARIL) 25 MG tablet Take 25 mg by mouth at bedtime.     MAGNESIUM PO Take by mouth. Twice a week     metoprolol tartrate (LOPRESSOR) 25 MG tablet Take 0.5 tablets (12.5 mg total) by mouth 2 (two) times daily. 30 tablet 6   montelukast (SINGULAIR) 10 MG tablet Take 10 mg by mouth at bedtime.     potassium chloride SA (KLOR-CON) 20 MEQ tablet Take 1 tablet (20 mEq total) by mouth daily. 15 tablet 0   vitamin B-12 (CYANOCOBALAMIN) 1000 MCG tablet Take 1,000 mcg by mouth daily.     vitamin C (ASCORBIC ACID) 500 MG tablet Take 500 mg by mouth daily.     No current facility-administered medications for this visit.   Allergies:   Penicillins   Social History: The patient  reports that she quit smoking about 34 years ago. Her smoking use included cigarettes. She has a 2.50 pack-year smoking history. She has never used smokeless tobacco. She reports that she does not drink alcohol and does not use drugs.   Family History: The patient's family history includes Colon polyps in her sister; Kidney cancer in her mother.   ROS:  Please see the history of present illness. Otherwise, complete review of systems is positive for none.  All other systems are reviewed and negative.   Physical Exam: VS:  BP 132/78   Pulse 91   Ht 5\' 8"  (1.727 m)   Wt 182 lb 9.6 oz (82.8 kg)   SpO2 93%   BMI 27.76 kg/m , BMI Body mass index is 27.76 kg/m.  Wt Readings from Last 3 Encounters:  12/21/20 182 lb 9.6 oz (82.8 kg)  11/02/20 186 lb (84.4 kg)  10/17/20 187 lb (84.8 kg)    General: Patient appears comfortable at rest. Neck: Supple, no elevated JVP or carotid bruits, no thyromegaly. Lungs: Fine crackles heard in posterior and anterior lung bases bilaterally, nonlabored breathing at rest. Cardiac: Irregularly irregular rate and rhythm, no S3 or significant systolic murmur, no pericardial rub. Extremities: No pitting edema, distal pulses 2+. Skin: Warm and dry. Musculoskeletal: No kyphosis. Neuropsychiatric: Alert and oriented x3, affect grossly appropriate.  ECG:  An ECG dated 05/29/2020 was personally reviewed today and demonstrated:  Atrial fibrillation with premature ventricular complexes or aberrantly conducted complexes rate of 99.  Cannot rule out anterior infarct, age undetermined.  Recent Labwork: 10/17/2020: ALT 14; AST 23; B Natriuretic Peptide 379.0; BUN 23; Creatinine, Ser 1.44; Hemoglobin 8.6; Magnesium 2.1; Platelets 292; Potassium 4.2; Sodium 141  No results found for: CHOL, TRIG, HDL, CHOLHDL, VLDL, LDLCALC, LDLDIRECT  Recent lab work from PCP office on 04/27/2020: BUN 25, creatinine 1.92, alkaline phosphatase 142.   Hemoglobin 8.6, hematocrit 27.5.   Other Studies Reviewed Today:   Echocardiogram 12/13/2020   1. Left ventricular ejection fraction, by estimation, is 60 to 65%. The left ventricle has normal function. The left ventricle has no regional wall motion abnormalities. Left ventricular diastolic parameters are indeterminate. 2. RV-RA gradient 36 mmHg suggesting at least mildly increased RVSP. Right ventricular systolic function is normal. The right ventricular size is normal. 3. Left atrial size was mildly dilated. 4. The mitral valve appears rheumatic, mildly thickened with restricted leaflet motion. Moderate mitral valve regurgitation. Moderate mitral stenosis. The mean mitral valve gradient is 5.5 mmHg. Valve area 1.25 cm2 by planimetry. 5. The aortic valve is tricuspid. There is mild calcification of the aortic valve. Aortic valve regurgitation is not visualized. 6. Unable to estimate CVP. Comparison(s): Echocardiogram done 03/02/17 showed an EF of 55-60% with mild to moderate mital stenosis.    Echocardiogram [facility] 10/24/2019 Summary 1. The left ventricle is normal in size with normal wall thickness. 2. The left ventricular systolic function is borderline, LVEF is visually estimated at 50%. 3. The mitral valve leaflets are mildly thickened with mildly reduced leaflet mobility. 4. There is mild mitral valve regurgitation and minimal stenosis as below. 5. The left atrium is moderately to severely dilated in size. 6. The right ventricle is normal in size, with normal systolic function.   Left Ventricle The left ventricle is normal in size with normal wall thickness. The left ventricular systolic function is borderline, LVEF is visually estimated at 50%. Left ventricular diastolic function cannot be accurately assessed.  Right Ventricle The right ventricle is normal in size, with normal systolic function.   Left Atrium The left atrium is moderately to severely dilated  in size.  Right Atrium The right atrium is normal in size.   Aortic Valve The aortic valve is trileaflet with normal appearing leaflets with normal excursion. There is no significant aortic regurgitation. There is no evidence of a significant transvalvular gradient.  Pulmonic Valve The pulmonic valve is normal. There is no significant pulmonic regurgitation. There is no evidence of  a significant transvalvular gradient.  Mitral Valve The mitral valve leaflets are mildly thickened with mildly reduced leaflet mobility. Mitral annular calcification is present (mild). There is mild mitral valve regurgitation and minimal stenosis as below. Posterior leaflet is restricted but with minimal mitral valve stenosis (mean gradient 4-5 mmHg at HR 95 bpm, MVA 2.66 cm2).  Tricuspid Valve The tricuspid valve leaflets are poorly visualized but probably normal, with normal leaflet mobility. There is mild tricuspid regurgitation. There is no pulmonary hypertension, estimated pulmonary artery systolic pressure is 36 mmHg. Other Findings Rhythm: Atrial Fibrillation. Pericardium/Pleural There is no pericardial effusion. Inferior Vena Cava The IVC is suboptimally visualized but probably suggests normal right atrial pressure.  Aorta  The aorta is normal in size in the visualized segments.   Assessment and Plan:   1. Acute on chronic diastolic heart failure ([place])  She is in no acute respiratory distress today.  O2 sats on 2 L nasal cannula 99%.  Previously advised her her to take Lasix 40 mg every other day and on other days if she gains 3 pounds in 24 hours or 5 pounds in a week she may take an extra dose on those days.  Today she states she has not taking Lasix in a week and has no issues with increased shortness of breath, weight gain, or lower extremity edema.  Today's weight is 182 down from 186 at last visit..  Continue Lasix as directed.  2. Paroxysmal atrial fibrillation (HCC) Heart  rate is irregularly irregular at 91.  Continue diltiazem 300 mg p.o. daily.  Continue metoprolol 12.5 mg p.o. twice daily.  Continue aspirin 81 mg daily.  3. History of mitral valve stenosis Previous echocardiogram 10/24/2019 showed mild mitral valve regurgitation and minimal stenosis.  Posterior leaflet was restricted but with minimal mitral valve stenosis (mean gradient of 45 mmHg at heart rate of 95, MVA 2.66 cm).  Recent echocardiogram. On 12/13/2020 demonstrated EF 60 to 65% no WMA's.  Indeterminate diastolic parameters.  RV systolic function normal.  Moderate MR, moderate MS.  4.  Anemia Recent hemoglobin 8.6 and hematocrit 27.9 at recent hospital visit on July 17, 2020.  More recent CBC on 10/17/2020 demonstrated hemoglobin of 8.6 and hematocrit of 29.7 at recent ED visit.  She recently received an iron infusion and states she is feeling better since receiving.   5.  CKD stage III/IV Recent repeat lab work showed elevated creatinine 2.16 and GFR of 20.  She states she has a follow-up soon with Dr. Theador Hawthorne.  She states she has not taken Lasix in the last week.  Medication Adjustments/Labs and Tests Ordered: Current medicines are reviewed at length with the patient today.  Concerns regarding medicines are outlined above.   Disposition: Follow-up with Dr. Harl Bowie or APP 6 months  Signed, Levell July, NP 12/21/2020 2:09 PM    [facility] [address], [place], [place] [address] Phone: [phone]; Fax: [phone]

## 2020-12-21 ENCOUNTER — Other Ambulatory Visit: Payer: Self-pay

## 2020-12-21 ENCOUNTER — Ambulatory Visit: Payer: Medicare PPO | Admitting: Family Medicine

## 2020-12-21 ENCOUNTER — Encounter: Payer: Self-pay | Admitting: Family Medicine

## 2020-12-21 VITALS — BP 132/78 | HR 91 | Ht 68.0 in | Wt 182.6 lb

## 2020-12-21 DIAGNOSIS — I480 Paroxysmal atrial fibrillation: Secondary | ICD-10-CM | POA: Diagnosis not present

## 2020-12-21 DIAGNOSIS — N183 Chronic kidney disease, stage 3 unspecified: Secondary | ICD-10-CM

## 2020-12-21 DIAGNOSIS — Z8679 Personal history of other diseases of the circulatory system: Secondary | ICD-10-CM

## 2020-12-21 DIAGNOSIS — I5032 Chronic diastolic (congestive) heart failure: Secondary | ICD-10-CM

## 2020-12-21 DIAGNOSIS — D649 Anemia, unspecified: Secondary | ICD-10-CM

## 2020-12-21 NOTE — Patient Instructions (Addendum)
Medication Instructions:  Your physician recommends that you continue on your current medications as directed. Please refer to the Current Medication list given to you today.  Labwork: none  Testing/Procedures: none  Follow-Up: Your physician recommends that you schedule a follow-up appointment in: as planned with Dr. Harl Bowie in August 2022.  Any Other Special Instructions Will Be Listed Below (If Applicable).  If you need a refill on your cardiac medications before your next appointment, please call your pharmacy.

## 2020-12-31 DIAGNOSIS — E211 Secondary hyperparathyroidism, not elsewhere classified: Secondary | ICD-10-CM | POA: Diagnosis not present

## 2020-12-31 DIAGNOSIS — D508 Other iron deficiency anemias: Secondary | ICD-10-CM | POA: Diagnosis not present

## 2020-12-31 DIAGNOSIS — I129 Hypertensive chronic kidney disease with stage 1 through stage 4 chronic kidney disease, or unspecified chronic kidney disease: Secondary | ICD-10-CM | POA: Diagnosis not present

## 2020-12-31 DIAGNOSIS — D472 Monoclonal gammopathy: Secondary | ICD-10-CM | POA: Diagnosis not present

## 2020-12-31 DIAGNOSIS — N1832 Chronic kidney disease, stage 3b: Secondary | ICD-10-CM | POA: Diagnosis not present

## 2020-12-31 DIAGNOSIS — N170 Acute kidney failure with tubular necrosis: Secondary | ICD-10-CM | POA: Diagnosis not present

## 2021-01-01 DIAGNOSIS — J449 Chronic obstructive pulmonary disease, unspecified: Secondary | ICD-10-CM | POA: Diagnosis not present

## 2021-01-09 ENCOUNTER — Other Ambulatory Visit: Payer: Medicare PPO

## 2021-01-11 DIAGNOSIS — D472 Monoclonal gammopathy: Secondary | ICD-10-CM | POA: Diagnosis not present

## 2021-01-11 DIAGNOSIS — N1832 Chronic kidney disease, stage 3b: Secondary | ICD-10-CM | POA: Diagnosis not present

## 2021-01-11 DIAGNOSIS — D508 Other iron deficiency anemias: Secondary | ICD-10-CM | POA: Diagnosis not present

## 2021-01-11 DIAGNOSIS — L659 Nonscarring hair loss, unspecified: Secondary | ICD-10-CM | POA: Diagnosis not present

## 2021-01-11 DIAGNOSIS — I5032 Chronic diastolic (congestive) heart failure: Secondary | ICD-10-CM | POA: Diagnosis not present

## 2021-01-11 DIAGNOSIS — E211 Secondary hyperparathyroidism, not elsewhere classified: Secondary | ICD-10-CM | POA: Diagnosis not present

## 2021-01-11 DIAGNOSIS — I129 Hypertensive chronic kidney disease with stage 1 through stage 4 chronic kidney disease, or unspecified chronic kidney disease: Secondary | ICD-10-CM | POA: Diagnosis not present

## 2021-01-31 DIAGNOSIS — J449 Chronic obstructive pulmonary disease, unspecified: Secondary | ICD-10-CM | POA: Diagnosis not present

## 2021-02-14 DIAGNOSIS — J849 Interstitial pulmonary disease, unspecified: Secondary | ICD-10-CM | POA: Diagnosis not present

## 2021-02-14 DIAGNOSIS — I4891 Unspecified atrial fibrillation: Secondary | ICD-10-CM | POA: Diagnosis not present

## 2021-02-14 DIAGNOSIS — Z6831 Body mass index (BMI) 31.0-31.9, adult: Secondary | ICD-10-CM | POA: Diagnosis not present

## 2021-02-14 DIAGNOSIS — N184 Chronic kidney disease, stage 4 (severe): Secondary | ICD-10-CM | POA: Diagnosis not present

## 2021-02-14 DIAGNOSIS — D649 Anemia, unspecified: Secondary | ICD-10-CM | POA: Diagnosis not present

## 2021-02-14 DIAGNOSIS — J449 Chronic obstructive pulmonary disease, unspecified: Secondary | ICD-10-CM | POA: Diagnosis not present

## 2021-02-14 DIAGNOSIS — M25561 Pain in right knee: Secondary | ICD-10-CM | POA: Diagnosis not present

## 2021-02-14 DIAGNOSIS — I509 Heart failure, unspecified: Secondary | ICD-10-CM | POA: Diagnosis not present

## 2021-02-19 ENCOUNTER — Ambulatory Visit: Payer: Medicare PPO | Admitting: Cardiology

## 2021-02-19 ENCOUNTER — Encounter: Payer: Self-pay | Admitting: Cardiology

## 2021-02-19 VITALS — BP 120/70 | HR 89 | Ht 68.0 in | Wt 183.0 lb

## 2021-02-19 DIAGNOSIS — I5032 Chronic diastolic (congestive) heart failure: Secondary | ICD-10-CM | POA: Diagnosis not present

## 2021-02-19 DIAGNOSIS — I050 Rheumatic mitral stenosis: Secondary | ICD-10-CM | POA: Diagnosis not present

## 2021-02-19 DIAGNOSIS — I4891 Unspecified atrial fibrillation: Secondary | ICD-10-CM | POA: Diagnosis not present

## 2021-02-19 NOTE — Patient Instructions (Addendum)
Medication Instructions:  Continue all current medications.  Labwork: none  Testing/Procedures: none  Follow-Up: 4 months   Any Other Special Instructions Will Be Listed Below (If Applicable).  If you need a refill on your cardiac medications before your next appointment, please call your pharmacy.\ 

## 2021-02-19 NOTE — Progress Notes (Signed)
Clinical Summary Amber Hull is a 87 y.o.female former patient of Dr Bronson Ing, this is our first visit together. Seen for the following medical problems.    Afib  -off eliquis due to recurrent issues with anemia - no recent palpitations.  - compliant with meds  2. Chronic diastolic HF - 03/3789 echo LVEF 60-65%, no WMAs, indet dd, mod MR, mod MS mean grad 5.5 - occas LE edema - has missed some lasix dosing recently.    3.Mitral stenosis - 12/2020 mod MS mean grade 5.5   4. Anemia - followed by nephrology, pcp   5. Chronic respiratory failure - on home O2  6. CKD - followed by Dr Theador Hawthorne   Past Medical History:  Diagnosis Date   Anemia    Arthritis    Asthma    Atrial fibrillation (HCC)    CHF (congestive heart failure) (HCC)    COPD (chronic obstructive pulmonary disease) ([place])    History of nuclear stress test 2017   intermediate risk   History of shingles    HOH (hard of hearing)    Mitral stenosis    Neuralgia    On home O2    3L N/C     Allergies  Allergen Reactions   Penicillins Rash    Has patient had a PCN reaction causing immediate rash, facial/tongue/throat swelling, SOB or lightheadedness with hypotension: No Has patient had a PCN reaction causing severe rash involving mucus membranes or skin necrosis: No Has patient had a PCN reaction that required hospitalization: No Has patient had a PCN reaction occurring within the last 10 years: No If all of the above answers are "NO", then may proceed with Cephalosporin use.      Current Outpatient Medications  Medication Sig Dispense Refill   albuterol (PROVENTIL HFA;VENTOLIN HFA) 108 (90 BASE) MCG/ACT inhaler Inhale into the lungs every 6 (six) hours as needed for wheezing or shortness of breath.     albuterol (PROVENTIL) (2.5 MG/3ML) 0.083% nebulizer solution Take 2.5 mg by nebulization every 6 (six) hours as needed for wheezing or shortness of breath.     aspirin EC 81 MG tablet Take 81  mg by mouth daily. Swallow whole.     calcitRIOL (ROCALTROL) 0.25 MCG capsule Take by mouth.     calcium carbonate (OS-CAL) 600 MG TABS tablet Take 600 mg by mouth. Doesn't take everyday     cholecalciferol (VITAMIN D) 1000 UNITS tablet Take 1,000 Units by mouth daily.     diltiazem (CARDIZEM CD) 300 MG 24 hr capsule Take 1 capsule (300 mg total) by mouth daily. 90 capsule 3   diltiazem (TIAZAC) 240 MG 24 hr capsule Take 240 mg by mouth daily.     esomeprazole (NEXIUM) 40 MG capsule 1 PO 30 MINS PRIOR TO FIRST MEAL 30 capsule 11   furosemide (LASIX) 40 MG tablet Take 1 tablet (40 mg total) by mouth every other day. (May take one extra tab as needed for weight gain of 3 lbs / 24 hours OR 5 lbs / 1 week.)     gabapentin (NEURONTIN) 400 MG capsule Take 400 mg by mouth 3 (three) times daily.     hydrOXYzine (ATARAX/VISTARIL) 25 MG tablet Take 25 mg by mouth at bedtime.     MAGNESIUM PO Take by mouth. Twice a week     metoprolol tartrate (LOPRESSOR) 25 MG tablet Take 0.5 tablets (12.5 mg total) by mouth 2 (two) times daily. 30 tablet 6   montelukast (SINGULAIR)  10 MG tablet Take 10 mg by mouth at bedtime.     potassium chloride SA (KLOR-CON) 20 MEQ tablet Take 1 tablet (20 mEq total) by mouth daily. 15 tablet 0   vitamin B-12 (CYANOCOBALAMIN) 1000 MCG tablet Take 1,000 mcg by mouth daily.     vitamin C (ASCORBIC ACID) 500 MG tablet Take 500 mg by mouth daily.     No current facility-administered medications for this visit.     Past Surgical History:  Procedure Laterality Date   ABDOMINAL HYSTERECTOMY     BIOPSY  08/04/2017   Procedure: BIOPSY;  Surgeon: Danie Binder, MD;  [facility];  Service: Endoscopy;;  duodenal gastric   CHOLECYSTECTOMY     COLONOSCOPY WITH PROPOFOL N/A 08/04/2017   Procedure: COLONOSCOPY WITH PROPOFOL;  Surgeon: Danie Binder, MD;  [facility];  Service: Endoscopy;  Laterality: N/A;  8:30am   ESOPHAGOGASTRODUODENOSCOPY (EGD) WITH PROPOFOL N/A  08/04/2017   Procedure: ESOPHAGOGASTRODUODENOSCOPY (EGD) WITH PROPOFOL;  Surgeon: Danie Binder, MD;  [facility];  Service: Endoscopy;  Laterality: N/A;   FASCIECTOMY Right 10/17/2014   Procedure: FASCIECTOMY RIGHT RING/SMALL FINGERS;  Surgeon: Daryll Brod, MD;  Location: [place];  Service: Orthopedics;  Laterality: Right;   KNEE ARTHROSCOPY     left   POLYPECTOMY  08/04/2017   Procedure: POLYPECTOMY;  Surgeon: Danie Binder, MD;  [facility];  Service: Endoscopy;;  colon   TUBAL LIGATION     TYMPANOSTOMY TUBE PLACEMENT     both ears     Allergies  Allergen Reactions   Penicillins Rash    Has patient had a PCN reaction causing immediate rash, facial/tongue/throat swelling, SOB or lightheadedness with hypotension: No Has patient had a PCN reaction causing severe rash involving mucus membranes or skin necrosis: No Has patient had a PCN reaction that required hospitalization: No Has patient had a PCN reaction occurring within the last 10 years: No If all of the above answers are "NO", then may proceed with Cephalosporin use.       Family History  Problem Relation Age of Onset   Kidney cancer Mother    Colon polyps Sister    Colon cancer Neg Hx      Social History Ms. Bomkamp reports that she quit smoking about 34 years ago. Her smoking use included cigarettes. She has a 2.50 pack-year smoking history. She has never used smokeless tobacco. Ms. Zagal reports no history of alcohol use.   Review of Systems CONSTITUTIONAL: No weight loss, fever, chills, weakness or fatigue.  HEENT: Eyes: No visual loss, blurred vision, double vision or yellow sclerae.No hearing loss, sneezing, congestion, runny nose or sore throat.  SKIN: No rash or itching.  CARDIOVASCULAR: per hpi RESPIRATORY: per hpi GASTROINTESTINAL: No anorexia, nausea, vomiting or diarrhea. No abdominal pain or blood.  GENITOURINARY: No burning on urination, no  polyuria NEUROLOGICAL: No headache, dizziness, syncope, paralysis, ataxia, numbness or tingling in the extremities. No change in bowel or bladder control.  MUSCULOSKELETAL: No muscle, back pain, joint pain or stiffness.  LYMPHATICS: No enlarged nodes. No history of splenectomy.  PSYCHIATRIC: No history of depression or anxiety.  ENDOCRINOLOGIC: No reports of sweating, cold or heat intolerance. No polyuria or polydipsia.  Marland Kitchen   Physical Examination Today's Vitals   02/19/21 1356  BP: 120/70  Pulse: 89  SpO2: 93%  Weight: 183 lb (83 kg)  Height: 5\' 8"  (1.727 m)   Body mass index is 27.83  kg/m.  Gen: resting comfortably, no acute distress HEENT: no scleral icterus, pupils equal round and reactive, no palptable cervical adenopathy,  CV: irreg Resp: crackles bilaterally GI: abdomen is soft, non-tender, non-distended, normal bowel sounds, no hepatosplenomegaly MSK: extremities are warm, no edema.  Skin: warm, no rash Neuro:  no focal deficits Psych: appropriate affect   Diagnostic Studies  12/2020 echo IMPRESSIONS     1. Left ventricular ejection fraction, by estimation, is 60 to 65%. The  left ventricle has normal function. The left ventricle has no regional  wall motion abnormalities. Left ventricular diastolic parameters are  indeterminate.   2. RV-RA gradient 36 mmHg suggesting at least mildly increased RVSP.  Right ventricular systolic function is normal. The right ventricular size  is normal.   3. Left atrial size was mildly dilated.   4. The mitral valve appears rheumatic, mildly thickened with restricted  leaflet motion. Moderate mitral valve regurgitation. Moderate mitral  stenosis. The mean mitral valve gradient is 5.5 mmHg. Valve area 1.25 cm2  by planimetry.   5. The aortic valve is tricuspid. There is mild calcification of the  aortic valve. Aortic valve regurgitation is not visualized.   6. Unable to estimate CVP.    Assessment and Plan  1.AFib - no  symptoms, continue current meds - no anticoag due to recurrent issues with anemia  2. Chronic diastolic HF - some crackles on lung exam, she has missed some recent doses of her lasix - encouraged to take lasix daily  3. Moderate mitral stenosis - monitor at this time, moderate by 12/2020 echo   F/u 4 months with PA Ysidro Evert, M.D

## 2021-03-03 DIAGNOSIS — J449 Chronic obstructive pulmonary disease, unspecified: Secondary | ICD-10-CM | POA: Diagnosis not present

## 2021-03-20 DIAGNOSIS — N1832 Chronic kidney disease, stage 3b: Secondary | ICD-10-CM | POA: Diagnosis not present

## 2021-03-20 DIAGNOSIS — E211 Secondary hyperparathyroidism, not elsewhere classified: Secondary | ICD-10-CM | POA: Diagnosis not present

## 2021-03-20 DIAGNOSIS — D508 Other iron deficiency anemias: Secondary | ICD-10-CM | POA: Diagnosis not present

## 2021-03-20 DIAGNOSIS — I129 Hypertensive chronic kidney disease with stage 1 through stage 4 chronic kidney disease, or unspecified chronic kidney disease: Secondary | ICD-10-CM | POA: Diagnosis not present

## 2021-03-20 DIAGNOSIS — D472 Monoclonal gammopathy: Secondary | ICD-10-CM | POA: Diagnosis not present

## 2021-03-20 DIAGNOSIS — I5032 Chronic diastolic (congestive) heart failure: Secondary | ICD-10-CM | POA: Diagnosis not present

## 2021-04-03 DIAGNOSIS — R2681 Unsteadiness on feet: Secondary | ICD-10-CM | POA: Diagnosis not present

## 2021-04-03 DIAGNOSIS — M79671 Pain in right foot: Secondary | ICD-10-CM | POA: Diagnosis not present

## 2021-04-03 DIAGNOSIS — M170 Bilateral primary osteoarthritis of knee: Secondary | ICD-10-CM | POA: Diagnosis not present

## 2021-04-03 DIAGNOSIS — M7989 Other specified soft tissue disorders: Secondary | ICD-10-CM | POA: Diagnosis not present

## 2021-04-03 DIAGNOSIS — J449 Chronic obstructive pulmonary disease, unspecified: Secondary | ICD-10-CM | POA: Diagnosis not present

## 2021-04-24 DIAGNOSIS — J45909 Unspecified asthma, uncomplicated: Secondary | ICD-10-CM | POA: Diagnosis not present

## 2021-04-24 DIAGNOSIS — J449 Chronic obstructive pulmonary disease, unspecified: Secondary | ICD-10-CM | POA: Diagnosis not present

## 2021-04-24 DIAGNOSIS — H6120 Impacted cerumen, unspecified ear: Secondary | ICD-10-CM | POA: Diagnosis not present

## 2021-04-24 DIAGNOSIS — R030 Elevated blood-pressure reading, without diagnosis of hypertension: Secondary | ICD-10-CM | POA: Diagnosis not present

## 2021-05-03 DIAGNOSIS — J449 Chronic obstructive pulmonary disease, unspecified: Secondary | ICD-10-CM | POA: Diagnosis not present

## 2021-05-23 DIAGNOSIS — D649 Anemia, unspecified: Secondary | ICD-10-CM | POA: Diagnosis not present

## 2021-05-23 DIAGNOSIS — J449 Chronic obstructive pulmonary disease, unspecified: Secondary | ICD-10-CM | POA: Diagnosis not present

## 2021-05-23 DIAGNOSIS — M25561 Pain in right knee: Secondary | ICD-10-CM | POA: Diagnosis not present

## 2021-05-23 DIAGNOSIS — I509 Heart failure, unspecified: Secondary | ICD-10-CM | POA: Diagnosis not present

## 2021-05-23 DIAGNOSIS — N184 Chronic kidney disease, stage 4 (severe): Secondary | ICD-10-CM | POA: Diagnosis not present

## 2021-05-23 DIAGNOSIS — I4891 Unspecified atrial fibrillation: Secondary | ICD-10-CM | POA: Diagnosis not present

## 2021-05-23 DIAGNOSIS — J849 Interstitial pulmonary disease, unspecified: Secondary | ICD-10-CM | POA: Diagnosis not present

## 2021-05-23 DIAGNOSIS — Z23 Encounter for immunization: Secondary | ICD-10-CM | POA: Diagnosis not present

## 2021-05-30 DIAGNOSIS — I4891 Unspecified atrial fibrillation: Secondary | ICD-10-CM | POA: Diagnosis not present

## 2021-05-30 DIAGNOSIS — N184 Chronic kidney disease, stage 4 (severe): Secondary | ICD-10-CM | POA: Diagnosis not present

## 2021-05-30 DIAGNOSIS — D649 Anemia, unspecified: Secondary | ICD-10-CM | POA: Diagnosis not present

## 2021-05-30 DIAGNOSIS — I509 Heart failure, unspecified: Secondary | ICD-10-CM | POA: Diagnosis not present

## 2021-06-03 DIAGNOSIS — J449 Chronic obstructive pulmonary disease, unspecified: Secondary | ICD-10-CM | POA: Diagnosis not present

## 2021-06-27 ENCOUNTER — Ambulatory Visit: Payer: Medicare PPO | Admitting: Cardiology

## 2021-06-27 NOTE — Progress Notes (Deleted)
Clinical Summary Amber Hull is a 87 y.o.female  Afib   -off eliquis due to recurrent issues with anemia - no recent palpitations.  - compliant with meds   2. Chronic diastolic HF - 11/4006 echo LVEF 60-65%, no WMAs, indet dd, mod MR, mod MS mean grad 5.5 - occas LE edema - has missed some lasix dosing recently.      3.Mitral stenosis - 12/2020 mod MS mean grade 5.5     4. Anemia - followed by nephrology, pcp     5. Chronic respiratory failure - on home O2   6. CKD - followed by Dr Theador Hawthorne Past Medical History:  Diagnosis Date   Anemia    Arthritis    Asthma    Atrial fibrillation (HCC)    CHF (congestive heart failure) (HCC)    COPD (chronic obstructive pulmonary disease) ([place])    History of nuclear stress test 2017   intermediate risk   History of shingles    HOH (hard of hearing)    Mitral stenosis    Neuralgia    On home O2    3L N/C     Allergies  Allergen Reactions   Penicillins Rash    Has patient had a PCN reaction causing immediate rash, facial/tongue/throat swelling, SOB or lightheadedness with hypotension: No Has patient had a PCN reaction causing severe rash involving mucus membranes or skin necrosis: No Has patient had a PCN reaction that required hospitalization: No Has patient had a PCN reaction occurring within the last 10 years: No If all of the above answers are "NO", then may proceed with Cephalosporin use.      Current Outpatient Medications  Medication Sig Dispense Refill   albuterol (PROVENTIL HFA;VENTOLIN HFA) 108 (90 BASE) MCG/ACT inhaler Inhale into the lungs every 6 (six) hours as needed for wheezing or shortness of breath.     albuterol (PROVENTIL) (2.5 MG/3ML) 0.083% nebulizer solution Take 2.5 mg by nebulization every 6 (six) hours as needed for wheezing or shortness of breath.     aspirin EC 81 MG tablet Take 81 mg by mouth daily. Swallow whole.     calcitRIOL (ROCALTROL) 0.25 MCG capsule Take by mouth.     calcium  carbonate (OS-CAL) 600 MG TABS tablet Take 600 mg by mouth. Doesn't take everyday     cholecalciferol (VITAMIN D) 1000 UNITS tablet Take 1,000 Units by mouth daily.     diltiazem (CARDIZEM CD) 300 MG 24 hr capsule Take 1 capsule (300 mg total) by mouth daily. 90 capsule 3   esomeprazole (NEXIUM) 40 MG capsule 1 PO 30 MINS PRIOR TO FIRST MEAL 30 capsule 11   furosemide (LASIX) 40 MG tablet Take 1 tablet (40 mg total) by mouth every other day. (May take one extra tab as needed for weight gain of 3 lbs / 24 hours OR 5 lbs / 1 week.)     gabapentin (NEURONTIN) 400 MG capsule Take 400 mg by mouth 3 (three) times daily.     hydrOXYzine (ATARAX/VISTARIL) 25 MG tablet Take 25 mg by mouth at bedtime.     MAGNESIUM PO Take by mouth. Twice a week     metoprolol tartrate (LOPRESSOR) 25 MG tablet Take 0.5 tablets (12.5 mg total) by mouth 2 (two) times daily. 30 tablet 6   montelukast (SINGULAIR) 10 MG tablet Take 10 mg by mouth at bedtime.     potassium chloride SA (KLOR-CON) 20 MEQ tablet Take 1 tablet (20 mEq total) by mouth  daily. 15 tablet 0   vitamin B-12 (CYANOCOBALAMIN) 1000 MCG tablet Take 1,000 mcg by mouth daily.     vitamin C (ASCORBIC ACID) 500 MG tablet Take 500 mg by mouth daily.     No current facility-administered medications for this visit.     Past Surgical History:  Procedure Laterality Date   ABDOMINAL HYSTERECTOMY     BIOPSY  08/04/2017   Procedure: BIOPSY;  Surgeon: Danie Binder, MD;  [facility];  Service: Endoscopy;;  duodenal gastric   CHOLECYSTECTOMY     COLONOSCOPY WITH PROPOFOL N/A 08/04/2017   Procedure: COLONOSCOPY WITH PROPOFOL;  Surgeon: Danie Binder, MD;  [facility]: [facility];  Service: Endoscopy;  Laterality: N/A;  8:30am   ESOPHAGOGASTRODUODENOSCOPY (EGD) WITH PROPOFOL N/A 08/04/2017   Procedure: ESOPHAGOGASTRODUODENOSCOPY (EGD) WITH PROPOFOL;  Surgeon: Danie Binder, MD;  [facility];  Service: Endoscopy;  Laterality: N/A;    FASCIECTOMY Right 10/17/2014   Procedure: FASCIECTOMY RIGHT RING/SMALL FINGERS;  Surgeon: Daryll Brod, MD;  Location: [facility];  Service: Orthopedics;  Laterality: Right;   KNEE ARTHROSCOPY     left   POLYPECTOMY  08/04/2017   Procedure: POLYPECTOMY;  Surgeon: Danie Binder, MD;  [facility];  Service: Endoscopy;;  colon   TUBAL LIGATION     TYMPANOSTOMY TUBE PLACEMENT     both ears     Allergies  Allergen Reactions   Penicillins Rash    Has patient had a PCN reaction causing immediate rash, facial/tongue/throat swelling, SOB or lightheadedness with hypotension: No Has patient had a PCN reaction causing severe rash involving mucus membranes or skin necrosis: No Has patient had a PCN reaction that required hospitalization: No Has patient had a PCN reaction occurring within the last 10 years: No If all of the above answers are "NO", then may proceed with Cephalosporin use.       Family History  Problem Relation Age of Onset   Kidney cancer Mother    Colon polyps Sister    Colon cancer Neg Hx      Social History [name] reports that she quit smoking about 34 years ago. Her smoking use included cigarettes. She has a 2.50 pack-year smoking history. She has never used smokeless tobacco. [name] reports no history of alcohol use.   Review of Systems CONSTITUTIONAL: No weight loss, fever, chills, weakness or fatigue.  HEENT: Eyes: No visual loss, blurred vision, double vision or yellow sclerae.No hearing loss, sneezing, congestion, runny nose or sore throat.  SKIN: No rash or itching.  CARDIOVASCULAR:  RESPIRATORY: No shortness of breath, cough or sputum.  GASTROINTESTINAL: No anorexia, nausea, vomiting or diarrhea. No abdominal pain or blood.  GENITOURINARY: No burning on urination, no polyuria NEUROLOGICAL: No headache, dizziness, syncope, paralysis, ataxia, numbness or tingling in the extremities. No change in bowel or bladder control.   MUSCULOSKELETAL: No muscle, back pain, joint pain or stiffness.  LYMPHATICS: No enlarged nodes. No history of splenectomy.  PSYCHIATRIC: No history of depression or anxiety.  ENDOCRINOLOGIC: No reports of sweating, cold or heat intolerance. No polyuria or polydipsia.  Marland Kitchen   Physical Examination There were no vitals filed for this visit. There were no vitals filed for this visit.  Gen: resting comfortably, no acute distress HEENT: no scleral icterus, pupils equal round and reactive, no palptable cervical adenopathy,  CV Resp: Clear to auscultation bilaterally GI: abdomen is soft, non-tender, non-distended, normal bowel sounds, no hepatosplenomegaly MSK: extremities are warm,  no edema.  Skin: warm, no rash Neuro:  no focal deficits Psych: appropriate affect   Diagnostic Studies  12/2020 echo IMPRESSIONS     1. Left ventricular ejection fraction, by estimation, is 60 to 65%. The  left ventricle has normal function. The left ventricle has no regional  wall motion abnormalities. Left ventricular diastolic parameters are  indeterminate.   2. RV-RA gradient 36 mmHg suggesting at least mildly increased RVSP.  Right ventricular systolic function is normal. The right ventricular size  is normal.   3. Left atrial size was mildly dilated.   4. The mitral valve appears rheumatic, mildly thickened with restricted  leaflet motion. Moderate mitral valve regurgitation. Moderate mitral  stenosis. The mean mitral valve gradient is 5.5 mmHg. Valve area 1.25 cm2  by planimetry.   5. The aortic valve is tricuspid. There is mild calcification of the  aortic valve. Aortic valve regurgitation is not visualized.   6. Unable to estimate CVP.    Assessment and Plan    1.AFib - no symptoms, continue current meds - no anticoag due to recurrent issues with anemia   2. Chronic diastolic HF - some crackles on lung exam, she has missed some recent doses of her lasix - encouraged to take lasix  daily   3. Moderate mitral stenosis - monitor at this time, moderate by 12/2020 echo    Arnoldo Lenis, M.D., F.A.C.C.

## 2021-07-03 DIAGNOSIS — J449 Chronic obstructive pulmonary disease, unspecified: Secondary | ICD-10-CM | POA: Diagnosis not present

## 2021-08-03 DIAGNOSIS — J449 Chronic obstructive pulmonary disease, unspecified: Secondary | ICD-10-CM | POA: Diagnosis not present

## 2021-08-08 ENCOUNTER — Other Ambulatory Visit: Payer: Self-pay | Admitting: *Deleted

## 2021-08-08 DIAGNOSIS — I129 Hypertensive chronic kidney disease with stage 1 through stage 4 chronic kidney disease, or unspecified chronic kidney disease: Secondary | ICD-10-CM | POA: Diagnosis not present

## 2021-08-08 DIAGNOSIS — D508 Other iron deficiency anemias: Secondary | ICD-10-CM | POA: Diagnosis not present

## 2021-08-08 DIAGNOSIS — D472 Monoclonal gammopathy: Secondary | ICD-10-CM | POA: Diagnosis not present

## 2021-08-08 DIAGNOSIS — N1832 Chronic kidney disease, stage 3b: Secondary | ICD-10-CM | POA: Diagnosis not present

## 2021-08-08 MED ORDER — DILTIAZEM HCL ER COATED BEADS 300 MG PO CP24: 300.0000 mg | ORAL_CAPSULE | Freq: Every day | ORAL | 0 refills | Status: DC

## 2021-08-22 DIAGNOSIS — I129 Hypertensive chronic kidney disease with stage 1 through stage 4 chronic kidney disease, or unspecified chronic kidney disease: Secondary | ICD-10-CM | POA: Diagnosis not present

## 2021-08-22 DIAGNOSIS — I5032 Chronic diastolic (congestive) heart failure: Secondary | ICD-10-CM | POA: Diagnosis not present

## 2021-08-22 DIAGNOSIS — N1832 Chronic kidney disease, stage 3b: Secondary | ICD-10-CM | POA: Diagnosis not present

## 2021-08-22 DIAGNOSIS — D472 Monoclonal gammopathy: Secondary | ICD-10-CM | POA: Diagnosis not present

## 2021-08-22 DIAGNOSIS — E211 Secondary hyperparathyroidism, not elsewhere classified: Secondary | ICD-10-CM | POA: Diagnosis not present

## 2021-09-03 DIAGNOSIS — J449 Chronic obstructive pulmonary disease, unspecified: Secondary | ICD-10-CM | POA: Diagnosis not present

## 2021-09-22 ENCOUNTER — Other Ambulatory Visit: Payer: Self-pay | Admitting: Cardiology

## 2021-10-01 DIAGNOSIS — J449 Chronic obstructive pulmonary disease, unspecified: Secondary | ICD-10-CM | POA: Diagnosis not present

## 2021-10-09 ENCOUNTER — Telehealth: Payer: Self-pay | Admitting: Cardiology

## 2021-10-09 MED ORDER — DILTIAZEM HCL ER COATED BEADS 300 MG PO CP24: 300.0000 mg | ORAL_CAPSULE | Freq: Every day | ORAL | 1 refills | Status: DC

## 2021-10-09 NOTE — Telephone Encounter (Signed)
Pt has scheduled apt- please send in refills for meds till next apt w/ Dr. Harl Bowie  ?

## 2021-10-30 DIAGNOSIS — H2513 Age-related nuclear cataract, bilateral: Secondary | ICD-10-CM | POA: Diagnosis not present

## 2021-11-01 DIAGNOSIS — J449 Chronic obstructive pulmonary disease, unspecified: Secondary | ICD-10-CM | POA: Diagnosis not present

## 2021-11-28 DIAGNOSIS — N1832 Chronic kidney disease, stage 3b: Secondary | ICD-10-CM | POA: Diagnosis not present

## 2021-12-01 DIAGNOSIS — J449 Chronic obstructive pulmonary disease, unspecified: Secondary | ICD-10-CM | POA: Diagnosis not present

## 2021-12-02 ENCOUNTER — Encounter: Payer: Self-pay | Admitting: *Deleted

## 2021-12-03 ENCOUNTER — Encounter: Payer: Self-pay | Admitting: Cardiology

## 2021-12-03 ENCOUNTER — Ambulatory Visit: Payer: Medicare PPO | Admitting: Cardiology

## 2021-12-03 VITALS — BP 120/76 | HR 86 | Ht 68.0 in | Wt 175.8 lb

## 2021-12-03 DIAGNOSIS — Z8679 Personal history of other diseases of the circulatory system: Secondary | ICD-10-CM | POA: Diagnosis not present

## 2021-12-03 DIAGNOSIS — I4891 Unspecified atrial fibrillation: Secondary | ICD-10-CM | POA: Diagnosis not present

## 2021-12-03 DIAGNOSIS — I5032 Chronic diastolic (congestive) heart failure: Secondary | ICD-10-CM | POA: Diagnosis not present

## 2021-12-03 MED ORDER — DILTIAZEM HCL ER COATED BEADS 300 MG PO CP24: 300.0000 mg | ORAL_CAPSULE | Freq: Every day | ORAL | 3 refills | Status: DC

## 2021-12-03 NOTE — Patient Instructions (Signed)
Medication Instructions:  Continue all current medications.   Labwork: none  Testing/Procedures: none  Follow-Up: 6 months   Any Other Special Instructions Will Be Listed Below (If Applicable).   If you need a refill on your cardiac medications before your next appointment, please call your pharmacy.  

## 2021-12-03 NOTE — Progress Notes (Signed)
Clinical Summary Amber Hull is a 88 y.o.female seen today for follow up of the following medical problems.   Afib   -off eliquis due to recurrent issues with anemia - no recent palpitations - compliant with meds    2. Chronic diastolic HF - 10/5407 echo LVEF 60-65%, no WMAs, indet dd, mod MR, mod MS mean grad 5.5 - some occasional LE edema - last Cr 1.8     3.Mitral stenosis - 12/2020 mod MS mean grade 5.5     4. Anemia - followed by nephrology, pcp     5. Chronic respiratory failure - on home O2, she is on 4-5 L chronically   6. CKD - followed by Dr Theador Hawthorne   Past Medical History:  Diagnosis Date   Anemia    Arthritis    Asthma    Atrial fibrillation (HCC)    CHF (congestive heart failure) (HCC)    COPD (chronic obstructive pulmonary disease) ([place])    History of nuclear stress test 2017   intermediate risk   History of shingles    HOH (hard of hearing)    Mitral stenosis    Neuralgia    On home O2    3L N/C     Allergies  Allergen Reactions   Penicillins Rash    Has patient had a PCN reaction causing immediate rash, facial/tongue/throat swelling, SOB or lightheadedness with hypotension: No Has patient had a PCN reaction causing severe rash involving mucus membranes or skin necrosis: No Has patient had a PCN reaction that required hospitalization: No Has patient had a PCN reaction occurring within the last 10 years: No If all of the above answers are "NO", then may proceed with Cephalosporin use.      Current Outpatient Medications  Medication Sig Dispense Refill   albuterol (PROVENTIL HFA;VENTOLIN HFA) 108 (90 BASE) MCG/ACT inhaler Inhale into the lungs every 6 (six) hours as needed for wheezing or shortness of breath.     albuterol (PROVENTIL) (2.5 MG/3ML) 0.083% nebulizer solution Take 2.5 mg by nebulization every 6 (six) hours as needed for wheezing or shortness of breath.     aspirin EC 81 MG tablet Take 81 mg by mouth daily. Swallow  whole.     calcitRIOL (ROCALTROL) 0.25 MCG capsule Take by mouth.     calcium carbonate (OS-CAL) 600 MG TABS tablet Take 600 mg by mouth. Doesn't take everyday     cholecalciferol (VITAMIN D) 1000 UNITS tablet Take 1,000 Units by mouth daily.     diltiazem (CARDIZEM CD) 300 MG 24 hr capsule Take 1 capsule (300 mg total) by mouth daily. 30 capsule 1   esomeprazole (NEXIUM) 40 MG capsule 1 PO 30 MINS PRIOR TO FIRST MEAL 30 capsule 11   furosemide (LASIX) 40 MG tablet Take 1 tablet (40 mg total) by mouth every other day. (May take one extra tab as needed for weight gain of 3 lbs / 24 hours OR 5 lbs / 1 week.)     gabapentin (NEURONTIN) 400 MG capsule Take 400 mg by mouth 3 (three) times daily.     hydrOXYzine (ATARAX/VISTARIL) 25 MG tablet Take 25 mg by mouth at bedtime.     MAGNESIUM PO Take by mouth. Twice a week     metoprolol tartrate (LOPRESSOR) 25 MG tablet Take 0.5 tablets (12.5 mg total) by mouth 2 (two) times daily. 30 tablet 6   montelukast (SINGULAIR) 10 MG tablet Take 10 mg by mouth at bedtime.  potassium chloride SA (KLOR-CON) 20 MEQ tablet Take 1 tablet (20 mEq total) by mouth daily. 15 tablet 0   vitamin B-12 (CYANOCOBALAMIN) 1000 MCG tablet Take 1,000 mcg by mouth daily.     vitamin C (ASCORBIC ACID) 500 MG tablet Take 500 mg by mouth daily.     No current facility-administered medications for this visit.     Past Surgical History:  Procedure Laterality Date   ABDOMINAL HYSTERECTOMY     BIOPSY  08/04/2017   Procedure: BIOPSY;  Surgeon: Danie Binder, MD;  [facility];  Service: Endoscopy;;  duodenal gastric   CHOLECYSTECTOMY     COLONOSCOPY WITH PROPOFOL N/A 08/04/2017   Procedure: COLONOSCOPY WITH PROPOFOL;  Surgeon: Danie Binder, MD;  [facility];  Service: Endoscopy;  Laterality: N/A;  8:30am   ESOPHAGOGASTRODUODENOSCOPY (EGD) WITH PROPOFOL N/A 08/04/2017   Procedure: ESOPHAGOGASTRODUODENOSCOPY (EGD) WITH PROPOFOL;  Surgeon: Danie Binder,  MD;  [facility];  Service: Endoscopy;  Laterality: N/A;   FASCIECTOMY Right 10/17/2014   Procedure: FASCIECTOMY RIGHT RING/SMALL FINGERS;  Surgeon: Daryll Brod, MD;  Location: [place];  Service: Orthopedics;  Laterality: Right;   KNEE ARTHROSCOPY     left   POLYPECTOMY  08/04/2017   Procedure: POLYPECTOMY;  Surgeon: Danie Binder, MD;  [facility];  Service: Endoscopy;;  colon   TUBAL LIGATION     TYMPANOSTOMY TUBE PLACEMENT     both ears     Allergies  Allergen Reactions   Penicillins Rash    Has patient had a PCN reaction causing immediate rash, facial/tongue/throat swelling, SOB or lightheadedness with hypotension: No Has patient had a PCN reaction causing severe rash involving mucus membranes or skin necrosis: No Has patient had a PCN reaction that required hospitalization: No Has patient had a PCN reaction occurring within the last 10 years: No If all of the above answers are "NO", then may proceed with Cephalosporin use.       Family History  Problem Relation Age of Onset   Kidney cancer Mother    Colon polyps Sister    Colon cancer Neg Hx      Social History Amber Hull reports that she quit smoking about 35 years ago. Her smoking use included cigarettes. She has a 2.50 pack-year smoking history. She has never used smokeless tobacco. Amber Hull reports no history of alcohol use.   Review of Systems CONSTITUTIONAL: No weight loss, fever, chills, weakness or fatigue.  HEENT: Eyes: No visual loss, blurred vision, double vision or yellow sclerae.No hearing loss, sneezing, congestion, runny nose or sore throat.  SKIN: No rash or itching.  CARDIOVASCULAR: per hpi RESPIRATORY: per hpi GASTROINTESTINAL: No anorexia, nausea, vomiting or diarrhea. No abdominal pain or blood.  GENITOURINARY: No burning on urination, no polyuria NEUROLOGICAL: No headache, dizziness, syncope, paralysis, ataxia, numbness or tingling in the  extremities. No change in bowel or bladder control.  MUSCULOSKELETAL: No muscle, back pain, joint pain or stiffness.  LYMPHATICS: No enlarged nodes. No history of splenectomy.  PSYCHIATRIC: No history of depression or anxiety.  ENDOCRINOLOGIC: No reports of sweating, cold or heat intolerance. No polyuria or polydipsia.  Marland Kitchen   Physical Examination Today's Vitals   12/03/21 1430  BP: 120/76  Pulse: 86  SpO2: 91%  Weight: 175 lb 12.8 oz (79.7 kg)  Height: '5\' 8"'$  (1.727 m)   Body mass index is 26.73 kg/m.  Gen: resting comfortably, no acute distress HEENT: no scleral icterus,  pupils equal round and reactive, no palptable cervical adenopathy,  CV: irreg, no m/r/g no jvd Resp: Clear to auscultation bilaterally GI: abdomen is soft, non-tender, non-distended, normal bowel sounds, no hepatosplenomegaly MSK: extremities are warm, trace bilateral edema Skin: warm, no rash Neuro:  no focal deficits Psych: appropriate affect   Diagnostic Studies  12/2020 echo IMPRESSIONS     1. Left ventricular ejection fraction, by estimation, is 60 to 65%. The  left ventricle has normal function. The left ventricle has no regional  wall motion abnormalities. Left ventricular diastolic parameters are  indeterminate.   2. RV-RA gradient 36 mmHg suggesting at least mildly increased RVSP.  Right ventricular systolic function is normal. The right ventricular size  is normal.   3. Left atrial size was mildly dilated.   4. The mitral valve appears rheumatic, mildly thickened with restricted  leaflet motion. Moderate mitral valve regurgitation. Moderate mitral  stenosis. The mean mitral valve gradient is 5.5 mmHg. Valve area 1.25 cm2  by planimetry.   5. The aortic valve is tricuspid. There is mild calcification of the  aortic valve. Aortic valve regurgitation is not visualized.   6. Unable to estimate CVP.    Assessment and Plan  1.AFib - no symptoms, continue current meds - off anticoag due to  recurrent issues with anemia.    2. Chronic diastolic HF - appears near euvolemic, would not be more aggressive with diuretic at this time given renal dysfunction - continue current meds   3. Moderate mitral stenosis -monitor at this time, unlikely to be candidate for any form of intervention given advanced lung disease and kidney disease.       Arnoldo Lenis, M.D.

## 2021-12-04 DIAGNOSIS — E211 Secondary hyperparathyroidism, not elsewhere classified: Secondary | ICD-10-CM | POA: Diagnosis not present

## 2021-12-04 DIAGNOSIS — D638 Anemia in other chronic diseases classified elsewhere: Secondary | ICD-10-CM | POA: Diagnosis not present

## 2021-12-04 DIAGNOSIS — I5032 Chronic diastolic (congestive) heart failure: Secondary | ICD-10-CM | POA: Diagnosis not present

## 2021-12-04 DIAGNOSIS — N184 Chronic kidney disease, stage 4 (severe): Secondary | ICD-10-CM | POA: Diagnosis not present

## 2021-12-04 DIAGNOSIS — I129 Hypertensive chronic kidney disease with stage 1 through stage 4 chronic kidney disease, or unspecified chronic kidney disease: Secondary | ICD-10-CM | POA: Diagnosis not present

## 2021-12-04 DIAGNOSIS — D472 Monoclonal gammopathy: Secondary | ICD-10-CM | POA: Diagnosis not present

## 2021-12-27 ENCOUNTER — Inpatient Hospital Stay (HOSPITAL_COMMUNITY): Payer: Medicare PPO | Attending: Hematology | Admitting: Hematology

## 2021-12-27 ENCOUNTER — Ambulatory Visit (HOSPITAL_COMMUNITY)
Admission: RE | Admit: 2021-12-27 | Discharge: 2021-12-27 | Disposition: A | Discharge status: Home / Self Care | Payer: Medicare PPO | Source: Ambulatory Visit | Attending: Hematology | Admitting: Hematology

## 2021-12-27 ENCOUNTER — Inpatient Hospital Stay (HOSPITAL_COMMUNITY): Payer: Medicare PPO

## 2021-12-27 DIAGNOSIS — Z79899 Other long term (current) drug therapy: Secondary | ICD-10-CM | POA: Diagnosis not present

## 2021-12-27 DIAGNOSIS — R778 Other specified abnormalities of plasma proteins: Secondary | ICD-10-CM | POA: Insufficient documentation

## 2021-12-27 DIAGNOSIS — D472 Monoclonal gammopathy: Secondary | ICD-10-CM | POA: Diagnosis not present

## 2021-12-27 DIAGNOSIS — R7989 Other specified abnormal findings of blood chemistry: Secondary | ICD-10-CM | POA: Insufficient documentation

## 2021-12-27 DIAGNOSIS — I509 Heart failure, unspecified: Secondary | ICD-10-CM | POA: Insufficient documentation

## 2021-12-27 DIAGNOSIS — J449 Chronic obstructive pulmonary disease, unspecified: Secondary | ICD-10-CM | POA: Insufficient documentation

## 2021-12-27 DIAGNOSIS — I4891 Unspecified atrial fibrillation: Secondary | ICD-10-CM | POA: Diagnosis not present

## 2021-12-27 DIAGNOSIS — Z800 Family history of malignant neoplasm of digestive organs: Secondary | ICD-10-CM | POA: Insufficient documentation

## 2021-12-27 DIAGNOSIS — Z87891 Personal history of nicotine dependence: Secondary | ICD-10-CM | POA: Insufficient documentation

## 2021-12-27 LAB — COMPREHENSIVE METABOLIC PANEL
ALT: 12 U/L (ref 0–44)
AST: 23 U/L (ref 15–41)
Albumin: 3.8 g/dL (ref 3.5–5.0)
Alkaline Phosphatase: 90 U/L (ref 38–126)
Anion gap: 6 (ref 5–15)
BUN: 23 mg/dL (ref 8–23)
CO2: 29 mmol/L (ref 22–32)
Calcium: 9.1 mg/dL (ref 8.9–10.3)
Chloride: 103 mmol/L (ref 98–111)
Creatinine, Ser: 1.52 mg/dL — ABNORMAL HIGH (ref 0.44–1.00)
GFR, Estimated: 33 mL/min — ABNORMAL LOW (ref >60)
Glucose, Bld: 131 mg/dL — ABNORMAL HIGH (ref 70–99)
Potassium: 4.1 mmol/L (ref 3.5–5.1)
Sodium: 138 mmol/L (ref 135–145)
Total Bilirubin: 0.7 mg/dL (ref 0.3–1.2)
Total Protein: 7.1 g/dL (ref 6.5–8.1)

## 2021-12-27 LAB — LACTATE DEHYDROGENASE: LDH: 182 U/L (ref 98–192)

## 2021-12-27 NOTE — Progress Notes (Signed)
[facility] [address] [place] [address]   CLINIC:  Medical Oncology/Hematology  Patient Care Team: [place] Nation, MD as PCP - General (Internal Medicine) Harl Bowie, Alphonse Guild, MD as PCP - Cardiology (Cardiology) Danie Binder, MD (Inactive) as Consulting Physician (Gastroenterology) Derek Jack, MD as Medical Oncologist (Hematology)  CHIEF COMPLAINTS/PURPOSE OF CONSULTATION:  Evaluation for abnormal SPEP  HISTORY OF PRESENTING ILLNESS:  Amber Hull 88 y.o. female is here because of evaluation for abnormal SPEP, at the request of Dr. Theador Hawthorne.  Today she reports feeling good. She denies new pains, fevers, night sweats, and weight loss. She reports blurry vision due to cataracts. She has been on O2 for 5 years. She denies numbness/tingling. She reports occasional swelling in her ankles.  She lives at home on her own, and she is able to do her typical daily activities including driving. Prior to retirement she worked in a [employer]. She quit smoking 25 years ago. Her mother and a brother had kidney cancer.   MEDICAL HISTORY:  Past Medical History:  Diagnosis Date   Anemia    Arthritis    Asthma    Atrial fibrillation (HCC)    CHF (congestive heart failure) (HCC)    COPD (chronic obstructive pulmonary disease) ([place])    History of nuclear stress test 2017   intermediate risk   History of shingles    HOH (hard of hearing)    Mitral stenosis    Neuralgia    On home O2    3L N/C    SURGICAL HISTORY: Past Surgical History:  Procedure Laterality Date   ABDOMINAL HYSTERECTOMY     BIOPSY  08/04/2017   Procedure: BIOPSY;  Surgeon: Danie Binder, MD;  [facility];  Service: Endoscopy;;  duodenal gastric   CHOLECYSTECTOMY     COLONOSCOPY WITH PROPOFOL N/A 08/04/2017   Procedure: COLONOSCOPY WITH PROPOFOL;  Surgeon: Danie Binder, MD;  [facility];  Service: Endoscopy;  Laterality: N/A;  8:30am    ESOPHAGOGASTRODUODENOSCOPY (EGD) WITH PROPOFOL N/A 08/04/2017   Procedure: ESOPHAGOGASTRODUODENOSCOPY (EGD) WITH PROPOFOL;  Surgeon: Danie Binder, MD;  [facility];  Service: Endoscopy;  Laterality: N/A;   FASCIECTOMY Right 10/17/2014   Procedure: FASCIECTOMY RIGHT RING/SMALL FINGERS;  Surgeon: Daryll Brod, MD;  Location: [facility];  Service: Orthopedics;  Laterality: Right;   KNEE ARTHROSCOPY     left   POLYPECTOMY  08/04/2017   Procedure: POLYPECTOMY;  Surgeon: Danie Binder, MD;  [facility];  Service: Endoscopy;;  colon   TUBAL LIGATION     TYMPANOSTOMY TUBE PLACEMENT     both ears    SOCIAL HISTORY: Social History   Socioeconomic History   Marital status: Widowed    Spouse name: Not on file   Number of children: Not on file   Years of education: Not on file   Highest education level: Not on file  Occupational History   Not on file  Tobacco Use   Smoking status: Former    Packs/day: 0.25    Years: 10.00    Total pack years: 2.50    Types: Cigarettes    Quit date: 10/10/1986    Years since quitting: 35.2   Smokeless tobacco: Never  Vaping Use   Vaping Use: Never used  Substance and Sexual Activity   Alcohol use: No   Drug use: No   Sexual activity: Not Currently    Birth control/protection:  Surgical  Other Topics Concern   Not on file  Social History Narrative   Not on file   Social Determinants of Health   Financial Resource Strain: Not on file  Food Insecurity: Not on file  Transportation Needs: Not on file  Physical Activity: Not on file  Stress: Not on file  Social Connections: Not on file  Intimate Partner Violence: Not on file    FAMILY HISTORY: Family History  Problem Relation Age of Onset   Kidney cancer Mother    Colon polyps Sister    Colon cancer Neg Hx     ALLERGIES:  is allergic to penicillins.  MEDICATIONS:  Current Outpatient Medications  Medication Sig Dispense Refill   albuterol (PROVENTIL  HFA;VENTOLIN HFA) 108 (90 BASE) MCG/ACT inhaler Inhale into the lungs every 6 (six) hours as needed for wheezing or shortness of breath.     albuterol (PROVENTIL) (2.5 MG/3ML) 0.083% nebulizer solution Take 2.5 mg by nebulization every 6 (six) hours as needed for wheezing or shortness of breath.     aspirin EC 81 MG tablet Take 81 mg by mouth daily. Swallow whole.     calcitRIOL (ROCALTROL) 0.25 MCG capsule Take by mouth.     calcium carbonate (OS-CAL) 600 MG TABS tablet Take 600 mg by mouth. Doesn't take everyday     cholecalciferol (VITAMIN D) 1000 UNITS tablet Take 1,000 Units by mouth daily.     diltiazem (CARDIZEM CD) 300 MG 24 hr capsule Take 1 capsule (300 mg total) by mouth daily. 90 capsule 3   esomeprazole (NEXIUM) 40 MG capsule 1 PO 30 MINS PRIOR TO FIRST MEAL 30 capsule 11   furosemide (LASIX) 40 MG tablet Take 1 tablet (40 mg total) by mouth every other day. (May take one extra tab as needed for weight gain of 3 lbs / 24 hours OR 5 lbs / 1 week.)     gabapentin (NEURONTIN) 400 MG capsule Take 400 mg by mouth 3 (three) times daily.     hydrOXYzine (ATARAX/VISTARIL) 25 MG tablet Take 25 mg by mouth at bedtime.     MAGNESIUM PO Take by mouth. Twice a week     montelukast (SINGULAIR) 10 MG tablet Take 10 mg by mouth at bedtime.     potassium chloride SA (KLOR-CON) 20 MEQ tablet Take 1 tablet (20 mEq total) by mouth daily. 15 tablet 0   vitamin B-12 (CYANOCOBALAMIN) 1000 MCG tablet Take 1,000 mcg by mouth daily.     vitamin C (ASCORBIC ACID) 500 MG tablet Take 500 mg by mouth daily.     metoprolol tartrate (LOPRESSOR) 25 MG tablet Take 0.5 tablets (12.5 mg total) by mouth 2 (two) times daily. 30 tablet 6   vitamin A 3 MG (10000 UNITS) capsule Take by mouth.     No current facility-administered medications for this visit.    REVIEW OF SYSTEMS:   Review of Systems  Constitutional:  Negative for appetite change, fatigue, fever and unexpected weight change.  Eyes:  Positive for eye  problems.  Respiratory:  Positive for cough and shortness of breath.   Cardiovascular:  Positive for leg swelling (ankles).  Endocrine: Negative for hot flashes.  Neurological:  Positive for dizziness and headaches. Negative for numbness.  Psychiatric/Behavioral:  Positive for sleep disturbance.   All other systems reviewed and are negative.    PHYSICAL EXAMINATION: ECOG PERFORMANCE STATUS: 1 - Symptomatic but completely ambulatory  Vitals:   12/27/21 1206  BP: (!) 149/63  Pulse: 79  Resp:  19  Temp: (!) 97.5 F (36.4 C)  SpO2: 94%   Filed Weights   12/27/21 1206  Weight: 176 lb 9.6 oz (80.1 kg)   Physical Exam Vitals reviewed.  Constitutional:      Appearance: Normal appearance. She is obese.     Interventions: Nasal cannula in place.  Cardiovascular:     Rate and Rhythm: Normal rate and regular rhythm.     Pulses: Normal pulses.     Heart sounds: Normal heart sounds.  Pulmonary:     Effort: Pulmonary effort is normal.     Breath sounds: Normal breath sounds.  Musculoskeletal:     Right lower leg: No edema.     Left lower leg: No edema.  Neurological:     General: No focal deficit present.     Mental Status: She is alert and oriented to person, place, and time.  Psychiatric:        Mood and Affect: Mood normal.        Behavior: Behavior normal.      LABORATORY DATA:  I have reviewed the data as listed No results found for this or any previous visit (from the past 2160 hour(s)).  RADIOGRAPHIC STUDIES: I have personally reviewed the radiological images as listed and agreed with the findings in the report. No results found.  ASSESSMENT:  Abnormal SPEP: - Patient seen at the request of Dr. Theador Hawthorne. - 03/20/2021: SPEP-faint restricted band in the gamma region.  Serum immunofixation-IgG kappa faint band. - 11/28/2021: Creatinine-1.82, calcium-8.3, hemoglobin-11.2 - No new bone pains.  No B symptoms.  Has been on oxygen for the last 5 years.   Social/family  history: - She lives by herself at home and is independent of ADLs and IADLs.  She worked in the [employer] prior to retirement.  Quit smoking 25 years ago. - Mother had kidney cancer and brother had kidney cancer.   PLAN:  Abnormal SPEP: - We talked about the spectrum of plasma cell disorders. - Recommend repeating SPEP and immunofixation and free light chains along with LDH and beta-2 microglobulin.  We will also check CMP. - If there is confirmed M spike, will check skeletal survey. - If negative for bone lesions, will follow her once every 6 months.   All questions were answered. The patient knows to call the clinic with any problems, questions or concerns.  Derek Jack, MD 12/27/21 2:30 PM  [place] [phone]   I, Thana Ates, am acting as a scribe for Dr. Derek Jack.  I, Derek Jack MD, have reviewed the above documentation for accuracy and completeness, and I agree with the above.

## 2021-12-27 NOTE — Patient Instructions (Addendum)
[facility] at [facility] Discharge Instructions  You were seen and examined today by Dr. Delton Coombes. Dr. Delton Coombes is a hematologist, meaning that he specializes in blood abnormalities. Dr. Delton Coombes discussed your past medical history, family history of cancers/blood conditions and the events that led to you being here today.  You were referred to Dr. Delton Coombes due to an abnormal protein presence in your recent lab work.   Dr. Delton Coombes has recommended additional lab work today in an attempt to identify the cause. Dr. Delton Coombes has also recommended X-Rays of your bones to ensure there is no bone abnormality related to the protein presence.  This abnormal protein could be related to something known as MGUS (monoclonal gammopathy of unknown significance). This will be discussed further after additional lab work.   Please follow-up as scheduled.   Thank you for choosing [facility] at [facility] to provide your oncology and hematology care.  To afford each patient quality time with our provider, please arrive at least 15 minutes before your scheduled appointment time.   If you have a lab appointment with the [facility] please come in thru the Main Entrance and check in at the main information desk.  You need to re-schedule your appointment should you arrive 10 or more minutes late.  We strive to give you quality time with our providers, and arriving late affects you and other patients whose appointments are after yours.  Also, if you no show three or more times for appointments you may be dismissed from the clinic at the providers discretion.     Again, thank you for choosing [facility].  Our hope is that these requests will decrease the amount of time that you wait before being seen by our physicians.       _____________________________________________________________  Should you have questions after your visit to [facility], please contact our office at [phone] and follow the prompts.  Our office hours are 8:00 a.m. and 4:30 p.m. Monday - Friday.  Please note that voicemails left after 4:00 p.m. may not be returned until the following business day.  We are closed weekends and major holidays.  You do have access to a nurse 24-7, just call the main number to the clinic [phone] and do not press any options, hold on the line and a nurse will answer the phone.    For prescription refill requests, have your pharmacy contact our office and allow 72 hours.    Due to Covid, you will need to wear a mask upon entering the hospital. If you do not have a mask, a mask will be given to you at the Main Entrance upon arrival. For doctor visits, patients may have 1 support person age 45 or older with them. For treatment visits, patients can not have anyone with them due to social distancing guidelines and our immunocompromised population.

## 2021-12-28 LAB — BETA 2 MICROGLOBULIN, SERUM: Beta-2 Microglobulin: 5.7 mg/L — ABNORMAL HIGH (ref 0.6–2.4)

## 2021-12-30 LAB — KAPPA/LAMBDA LIGHT CHAINS
Kappa free light chain: 70.9 mg/L — ABNORMAL HIGH (ref 3.3–19.4)
Kappa, lambda light chain ratio: 1.87 — ABNORMAL HIGH (ref 0.26–1.65)
Lambda free light chains: 37.9 mg/L — ABNORMAL HIGH (ref 5.7–26.3)

## 2021-12-30 LAB — PROTEIN ELECTROPHORESIS, SERUM
A/G Ratio: 1.1 (ref 0.7–1.7)
Albumin ELP: 3.6 g/dL (ref 2.9–4.4)
Alpha-1-Globulin: 0.1 g/dL (ref 0.0–0.4)
Alpha-2-Globulin: 0.7 g/dL (ref 0.4–1.0)
Beta Globulin: 0.9 g/dL (ref 0.7–1.3)
Gamma Globulin: 1.5 g/dL (ref 0.4–1.8)
Globulin, Total: 3.3 g/dL (ref 2.2–3.9)
Total Protein ELP: 6.9 g/dL (ref 6.0–8.5)

## 2022-01-01 DIAGNOSIS — J449 Chronic obstructive pulmonary disease, unspecified: Secondary | ICD-10-CM | POA: Diagnosis not present

## 2022-01-01 LAB — IMMUNOFIXATION ELECTROPHORESIS
IgA: 133 mg/dL (ref 64–422)
IgG (Immunoglobin G), Serum: 1608 mg/dL — ABNORMAL HIGH (ref 586–1602)
IgM (Immunoglobulin M), Srm: 77 mg/dL (ref 26–217)
Total Protein ELP: 7.1 g/dL (ref 6.0–8.5)

## 2022-01-07 NOTE — Progress Notes (Deleted)
Amber Hull, [place] [address]   CLINIC:  Medical Oncology/Hematology  PCP:  [place] Nation, MD [facility] [address] [phone]   REASON FOR VISIT:  Follow-up for ***  PRIOR THERAPY: ***  CURRENT THERAPY: ***  INTERVAL HISTORY:  Amber Hull 88 y.o. female returns for routine follow-up of ***  At today's visit, she reports feeling ***.  No recent hospitalizations, surgeries, or changes in baseline health status.  *** No fatigue, fever, chills, shortness of breath, cough, chest pain, nausea, vomiting, abdominal pain.  Denies any signs or symptoms of blood loss.  No current signs or symptoms of blood clots.  She has ***% energy and ***% appetite. She endorses that she is maintaining a stable weight.    REVIEW OF SYSTEMS:  Review of Systems - Oncology    PAST MEDICAL/SURGICAL HISTORY:  Past Medical History:  Diagnosis Date   Anemia    Arthritis    Asthma    Atrial fibrillation (HCC)    CHF (congestive heart failure) (HCC)    COPD (chronic obstructive pulmonary disease) ([place])    History of nuclear stress test 2017   intermediate risk   History of shingles    HOH (hard of hearing)    Mitral stenosis    Neuralgia    On home O2    3L N/C   Past Surgical History:  Procedure Laterality Date   ABDOMINAL HYSTERECTOMY     BIOPSY  08/04/2017   Procedure: BIOPSY;  Surgeon: Danie Binder, MD;  [facility];  Service: Endoscopy;;  duodenal gastric   CHOLECYSTECTOMY     COLONOSCOPY WITH PROPOFOL N/A 08/04/2017   Procedure: COLONOSCOPY WITH PROPOFOL;  Surgeon: Danie Binder, MD;  [facility];  Service: Endoscopy;  Laterality: N/A;  8:30am   ESOPHAGOGASTRODUODENOSCOPY (EGD) WITH PROPOFOL N/A 08/04/2017   Procedure: ESOPHAGOGASTRODUODENOSCOPY (EGD) WITH PROPOFOL;  Surgeon: Danie Binder, MD;  [facility];  Service: Endoscopy;  Laterality: N/A;   FASCIECTOMY Right 10/17/2014    Procedure: FASCIECTOMY RIGHT RING/SMALL FINGERS;  Surgeon: Daryll Brod, MD;  Location: [facility];  Service: Orthopedics;  Laterality: Right;   KNEE ARTHROSCOPY     left   POLYPECTOMY  08/04/2017   Procedure: POLYPECTOMY;  Surgeon: Danie Binder, MD;  [facility];  Service: Endoscopy;;  colon   TUBAL LIGATION     TYMPANOSTOMY TUBE PLACEMENT     both ears     SOCIAL HISTORY:  Social History   Socioeconomic History   Marital status: Widowed    Spouse name: Not on file   Number of children: Not on file   Years of education: Not on file   Highest education level: Not on file  Occupational History   Not on file  Tobacco Use   Smoking status: Former    Packs/day: 0.25    Years: 10.00    Total pack years: 2.50    Types: Cigarettes    Quit date: 10/10/1986    Years since quitting: 35.2   Smokeless tobacco: Never  Vaping Use   Vaping Use: Never used  Substance and Sexual Activity   Alcohol use: No   Drug use: No   Sexual activity: Not Currently    Birth control/protection: Surgical  Other Topics Concern   Not on file  Social History Narrative   Not on file   Social Determinants of Radio broadcast assistant  Strain: Not on file  Food Insecurity: Not on file  Transportation Needs: Not on file  Physical Activity: Not on file  Stress: Not on file  Social Connections: Not on file  Intimate Partner Violence: Not on file    FAMILY HISTORY:  Family History  Problem Relation Age of Onset   Kidney cancer Mother    Colon polyps Sister    Colon cancer Neg Hx     CURRENT MEDICATIONS:  Outpatient Encounter Medications as of 01/08/2022  Medication Sig   albuterol (PROVENTIL HFA;VENTOLIN HFA) 108 (90 BASE) MCG/ACT inhaler Inhale into the lungs every 6 (six) hours as needed for wheezing or shortness of breath.   albuterol (PROVENTIL) (2.5 MG/3ML) 0.083% nebulizer solution Take 2.5 mg by nebulization every 6 (six) hours as needed for wheezing or  shortness of breath.   aspirin EC 81 MG tablet Take 81 mg by mouth daily. Swallow whole.   calcitRIOL (ROCALTROL) 0.25 MCG capsule Take by mouth.   calcium carbonate (OS-CAL) 600 MG TABS tablet Take 600 mg by mouth. Doesn't take everyday   cholecalciferol (VITAMIN D) 1000 UNITS tablet Take 1,000 Units by mouth daily.   diltiazem (CARDIZEM CD) 300 MG 24 hr capsule Take 1 capsule (300 mg total) by mouth daily.   esomeprazole (NEXIUM) 40 MG capsule 1 PO 30 MINS PRIOR TO FIRST MEAL   furosemide (LASIX) 40 MG tablet Take 1 tablet (40 mg total) by mouth every other day. (May take one extra tab as needed for weight gain of 3 lbs / 24 hours OR 5 lbs / 1 week.)   gabapentin (NEURONTIN) 400 MG capsule Take 400 mg by mouth 3 (three) times daily.   hydrOXYzine (ATARAX/VISTARIL) 25 MG tablet Take 25 mg by mouth at bedtime.   MAGNESIUM PO Take by mouth. Twice a week   metoprolol tartrate (LOPRESSOR) 25 MG tablet Take 0.5 tablets (12.5 mg total) by mouth 2 (two) times daily.   montelukast (SINGULAIR) 10 MG tablet Take 10 mg by mouth at bedtime.   potassium chloride SA (KLOR-CON) 20 MEQ tablet Take 1 tablet (20 mEq total) by mouth daily.   vitamin A 3 MG (10000 UNITS) capsule Take by mouth.   vitamin B-12 (CYANOCOBALAMIN) 1000 MCG tablet Take 1,000 mcg by mouth daily.   vitamin C (ASCORBIC ACID) 500 MG tablet Take 500 mg by mouth daily.   No facility-administered encounter medications on file as of 01/08/2022.    ALLERGIES:  Allergies  Allergen Reactions   Penicillins Rash    Has patient had a PCN reaction causing immediate rash, facial/tongue/throat swelling, SOB or lightheadedness with hypotension: No Has patient had a PCN reaction causing severe rash involving mucus membranes or skin necrosis: No Has patient had a PCN reaction that required hospitalization: No Has patient had a PCN reaction occurring within the last 10 years: No If all of the above answers are "NO", then may proceed with Cephalosporin  use.      PHYSICAL EXAM:  ECOG PERFORMANCE STATUS: {CHL ONC ECOG PS:[phone]}  There were no vitals filed for this visit. There were no vitals filed for this visit. Physical Exam   LABORATORY DATA:  I have reviewed the labs as listed.  CBC    Component Value Date/Time   WBC 11.2 (H) 10/17/2020 1616   RBC 3.43 (L) 10/17/2020 1616   HGB 8.6 (L) 10/17/2020 1616   HCT 29.7 (L) 10/17/2020 1616   PLT 292 10/17/2020 1616   MCV 86.6 10/17/2020 1616   MCH  25.1 (L) 10/17/2020 1616   MCHC 29.0 (L) 10/17/2020 1616   RDW 16.8 (H) 10/17/2020 1616   LYMPHSABS 1.5 10/17/2020 1616   MONOABS 0.5 10/17/2020 1616   EOSABS 0.8 (H) 10/17/2020 1616   BASOSABS 0.1 10/17/2020 1616      Latest Ref Rng & Units 12/27/2021    1:29 PM 10/17/2020    4:16 PM 07/29/2017    1:58 PM  CMP  Glucose 70 - 99 mg/dL 131  113  174   BUN 8 - 23 mg/dL '23  23  14   '$ Creatinine 0.44 - 1.00 mg/dL 1.52  1.44  1.12   Sodium 135 - 145 mmol/L 138  141  138   Potassium 3.5 - 5.1 mmol/L 4.1  4.2  4.0   Chloride 98 - 111 mmol/L 103  105  101   CO2 22 - 32 mmol/L '29  26  29   '$ Calcium 8.9 - 10.3 mg/dL 9.1  9.3  8.9   Total Protein 6.5 - 8.1 g/dL 7.1  7.8    Total Bilirubin 0.3 - 1.2 mg/dL 0.7  0.6    Alkaline Phos 38 - 126 U/L 90  121    AST 15 - 41 U/L 23  23    ALT 0 - 44 U/L 12  14      DIAGNOSTIC IMAGING:  I have independently reviewed the relevant imaging and discussed with the patient.  ASSESSMENT & PLAN: 1.  *** - *** - PLAN: ***  2.  *** - *** - PLAN: ***  PLAN SUMMARY & DISPOSITION: ***  All questions were answered. The patient knows to call the clinic with any problems, questions or concerns.  Medical decision making: ***  Time spent on visit: I spent {CHL ONC TIME VISIT - IRWER:[id number]} counseling the patient face to face. The total time spent in the appointment was {CHL ONC TIME VISIT - PJKDT:[id number]} and more than 50% was on counseling.   Amber Rush, PA-C  ***

## 2022-01-08 ENCOUNTER — Inpatient Hospital Stay (HOSPITAL_COMMUNITY): Payer: Medicare PPO | Admitting: Physician Assistant

## 2022-01-08 NOTE — Progress Notes (Deleted)
[place], [place] [address]   CLINIC:  Medical Oncology/Hematology  PCP:  [place] Nation, [place] [address] [phone]   REASON FOR VISIT:  Follow-up for ***  PRIOR THERAPY: ***  CURRENT THERAPY: ***  INTERVAL HISTORY:  Ms. Whalin 88 y.o. female returns for routine follow-up of ***  At today's visit, she reports feeling ***.  No recent hospitalizations, surgeries, or changes in baseline health status.  *** No fatigue, fever, chills, shortness of breath, cough, chest pain, nausea, vomiting, abdominal pain.  Denies any signs or symptoms of blood loss.  No current signs or symptoms of blood clots.  She has ***% energy and ***% appetite. She endorses that she is maintaining a stable weight.    REVIEW OF SYSTEMS:  Review of Systems - Oncology    PAST MEDICAL/SURGICAL HISTORY:  Past Medical History:  Diagnosis Date  . Anemia   . Arthritis   . Asthma   . Atrial fibrillation ([facility] [facility])   . CHF (congestive heart failure) ([facility])   . COPD (chronic obstructive pulmonary disease) ([facility])   . History of nuclear stress test 2017   intermediate risk  . History of shingles   . HOH (hard of hearing)   . Mitral stenosis   . Neuralgia   . On home O2    3L N/C   Past Surgical History:  Procedure Laterality Date  . ABDOMINAL HYSTERECTOMY    . BIOPSY  08/04/2017   Procedure: BIOPSY;  Surgeon: Danie Binder, MD;  [facility];  Service: Endoscopy;;  duodenal gastric  . CHOLECYSTECTOMY    . COLONOSCOPY WITH PROPOFOL N/A 08/04/2017   Procedure: COLONOSCOPY WITH PROPOFOL;  Surgeon: Danie Binder, MD;  [facility];  Service: Endoscopy;  Laterality: N/A;  8:30am  . ESOPHAGOGASTRODUODENOSCOPY (EGD) WITH PROPOFOL N/A 08/04/2017   Procedure: ESOPHAGOGASTRODUODENOSCOPY (EGD) WITH PROPOFOL;  Surgeon: Danie Binder, MD;  [facility];  Service: Endoscopy;  Laterality: N/A;  . FASCIECTOMY Right  10/17/2014   Procedure: FASCIECTOMY RIGHT RING/SMALL FINGERS;  Surgeon: Daryll Brod, MD;  Location: [facility];  Service: Orthopedics;  Laterality: Right;  . KNEE ARTHROSCOPY     left  . POLYPECTOMY  08/04/2017   Procedure: POLYPECTOMY;  Surgeon: Danie Binder, MD;  [facility];  Service: Endoscopy;;  colon  . TUBAL LIGATION    . TYMPANOSTOMY TUBE PLACEMENT     both ears     SOCIAL HISTORY:  Social History   Socioeconomic History  . Marital status: Widowed    Spouse name: Not on file  . Number of children: Not on file  . Years of education: Not on file  . Highest education level: Not on file  Occupational History  . Not on file  Tobacco Use  . Smoking status: Former    Packs/day: 0.25    Years: 10.00    Total pack years: 2.50    Types: Cigarettes    Quit date: 10/10/1986    Years since quitting: 35.2  . Smokeless tobacco: Never  Vaping Use  . Vaping Use: Never used  Substance and Sexual Activity  . Alcohol use: No  . Drug use: No  . Sexual activity: Not Currently    Birth control/protection: Surgical  Other Topics Concern  . Not on file  Social History Narrative  . Not on file   Social Determinants of Health   Financial Resource  Strain: Not on file  Food Insecurity: Not on file  Transportation Needs: Not on file  Physical Activity: Not on file  Stress: Not on file  Social Connections: Not on file  Intimate Partner Violence: Not on file    FAMILY HISTORY:  Family History  Problem Relation Age of Onset  . Kidney cancer Mother   . Colon polyps Sister   . Colon cancer Neg Hx     CURRENT MEDICATIONS:  Outpatient Encounter Medications as of 01/09/2022  Medication Sig  . albuterol (PROVENTIL HFA;VENTOLIN HFA) 108 (90 BASE) MCG/ACT inhaler Inhale into the lungs every 6 (six) hours as needed for wheezing or shortness of breath.  Marland Kitchen albuterol (PROVENTIL) (2.5 MG/3ML) 0.083% nebulizer solution Take 2.5 mg by nebulization every 6 (six)  hours as needed for wheezing or shortness of breath.  Marland Kitchen aspirin EC 81 MG tablet Take 81 mg by mouth daily. Swallow whole.  . calcitRIOL (ROCALTROL) 0.25 MCG capsule Take by mouth.  . calcium carbonate (OS-CAL) 600 MG TABS tablet Take 600 mg by mouth. Doesn't take everyday  . cholecalciferol (VITAMIN D) 1000 UNITS tablet Take 1,000 Units by mouth daily.  Marland Kitchen diltiazem (CARDIZEM CD) 300 MG 24 hr capsule Take 1 capsule (300 mg total) by mouth daily.  Marland Kitchen esomeprazole (NEXIUM) 40 MG capsule 1 PO 30 MINS PRIOR TO FIRST MEAL  . furosemide (LASIX) 40 MG tablet Take 1 tablet (40 mg total) by mouth every other day. (May take one extra tab as needed for weight gain of 3 lbs / 24 hours OR 5 lbs / 1 week.)  . gabapentin (NEURONTIN) 400 MG capsule Take 400 mg by mouth 3 (three) times daily.  . hydrOXYzine (ATARAX/VISTARIL) 25 MG tablet Take 25 mg by mouth at bedtime.  Marland Kitchen MAGNESIUM PO Take by mouth. Twice a week  . metoprolol tartrate (LOPRESSOR) 25 MG tablet Take 0.5 tablets (12.5 mg total) by mouth 2 (two) times daily.  . montelukast (SINGULAIR) 10 MG tablet Take 10 mg by mouth at bedtime.  . potassium chloride SA (KLOR-CON) 20 MEQ tablet Take 1 tablet (20 mEq total) by mouth daily.  . vitamin A 3 MG (10000 UNITS) capsule Take by mouth.  . vitamin B-12 (CYANOCOBALAMIN) 1000 MCG tablet Take 1,000 mcg by mouth daily.  . vitamin C (ASCORBIC ACID) 500 MG tablet Take 500 mg by mouth daily.   No facility-administered encounter medications on file as of 01/09/2022.    ALLERGIES:  Allergies  Allergen Reactions  . Penicillins Rash    Has patient had a PCN reaction causing immediate rash, facial/tongue/throat swelling, SOB or lightheadedness with hypotension: No Has patient had a PCN reaction causing severe rash involving mucus membranes or skin necrosis: No Has patient had a PCN reaction that required hospitalization: No Has patient had a PCN reaction occurring within the last 10 years: No If all of the above  answers are "NO", then may proceed with Cephalosporin use.      PHYSICAL EXAM:  ECOG PERFORMANCE STATUS: {CHL ONC ECOG PS:[phone]}  There were no vitals filed for this visit. There were no vitals filed for this visit. Physical Exam   LABORATORY DATA:  I have reviewed the labs as listed.  CBC    Component Value Date/Time   WBC 11.2 (H) 10/17/2020 1616   RBC 3.43 (L) 10/17/2020 1616   HGB 8.6 (L) 10/17/2020 1616   HCT 29.7 (L) 10/17/2020 1616   PLT 292 10/17/2020 1616   MCV 86.6 10/17/2020 1616   MCH  25.1 (L) 10/17/2020 1616   MCHC 29.0 (L) 10/17/2020 1616   RDW 16.8 (H) 10/17/2020 1616   LYMPHSABS 1.5 10/17/2020 1616   MONOABS 0.5 10/17/2020 1616   EOSABS 0.8 (H) 10/17/2020 1616   BASOSABS 0.1 10/17/2020 1616      Latest Ref Rng & Units 12/27/2021    1:29 PM 10/17/2020    4:16 PM 07/29/2017    1:58 PM  CMP  Glucose 70 - 99 mg/dL 131  113  174   BUN 8 - 23 mg/dL '23  23  14   '$ Creatinine 0.44 - 1.00 mg/dL 1.52  1.44  1.12   Sodium 135 - 145 mmol/L 138  141  138   Potassium 3.5 - 5.1 mmol/L 4.1  4.2  4.0   Chloride 98 - 111 mmol/L 103  105  101   CO2 22 - 32 mmol/L '29  26  29   '$ Calcium 8.9 - 10.3 mg/dL 9.1  9.3  8.9   Total Protein 6.5 - 8.1 g/dL 7.1  7.8    Total Bilirubin 0.3 - 1.2 mg/dL 0.7  0.6    Alkaline Phos 38 - 126 U/L 90  121    AST 15 - 41 U/L 23  23    ALT 0 - 44 U/L 12  14      DIAGNOSTIC IMAGING:  I have independently reviewed the relevant imaging and discussed with the patient.  ASSESSMENT & PLAN: 1.  *** - *** - PLAN: ***  2.  *** - *** - PLAN: ***  PLAN SUMMARY & DISPOSITION: ***  All questions were answered. The patient knows to call the clinic with any problems, questions or concerns.  Medical decision making: ***  Time spent on visit: I spent {CHL ONC TIME VISIT - GTXMI:[id number]} counseling the patient face to face. The total time spent in the appointment was {CHL ONC TIME VISIT - GNOIB:[id number]} and more than 50% was on  counseling.   Harriett Rush, PA-C  ***

## 2022-01-09 ENCOUNTER — Inpatient Hospital Stay (HOSPITAL_COMMUNITY): Payer: Medicare PPO | Admitting: Physician Assistant

## 2022-01-20 NOTE — Progress Notes (Signed)
[place], [place] [address]   CLINIC:  Medical Oncology/Hematology  PCP:  [place] Nation, [place] [address] [phone]   REASON FOR VISIT:  Follow-up for abnormal SPEP  CURRENT THERAPY: Under work-up  INTERVAL HISTORY:  Amber Hull 88 y.o. female returns for routine follow-up of her abnormal SPEP.  She was seen for initial consultation by Dr. Delton Coombes on 12/27/2021.  At today's visit, she reports feeling like her usual self.  She denies any changes in her symptoms or baseline health status since her visit with Dr. Delton Coombes last month.  She denies new pains, fevers, night sweats, and weight loss. She reports blurry vision due to cataracts. She has been on supplemental oxygen for 5 years. She denies numbness/tingling. She reports occasional swelling in her ankles.  She has 50% energy and 100% appetite. She endorses that she is maintaining a stable weight.   REVIEW OF SYSTEMS:  Review of Systems  Constitutional:  Positive for fatigue. Negative for appetite change, chills, diaphoresis, fever and unexpected weight change.  HENT:   Negative for lump/mass and nosebleeds.   Eyes:  Positive for eye problems.  Respiratory:  Positive for shortness of breath. Negative for cough and hemoptysis.   Cardiovascular:  Positive for chest pain (none today). Negative for leg swelling and palpitations.  Gastrointestinal:  Negative for abdominal pain, blood in stool, constipation, diarrhea, nausea and vomiting.  Genitourinary:  Negative for hematuria.   Musculoskeletal:  Positive for back pain.  Skin: Negative.   Neurological:  Positive for numbness. Negative for dizziness, headaches and light-headedness.  Hematological:  Does not bruise/bleed easily.      PAST MEDICAL/SURGICAL HISTORY:  Past Medical History:  Diagnosis Date   Anemia    Arthritis    Asthma    Atrial fibrillation ([facility])    CHF (congestive heart failure) ([facility])     COPD (chronic obstructive pulmonary disease) ([place])    History of nuclear stress test 2017   intermediate risk   History of shingles    HOH (hard of hearing)    Mitral stenosis    Neuralgia    On home O2    3L N/C   Past Surgical History:  Procedure Laterality Date   ABDOMINAL HYSTERECTOMY     BIOPSY  08/04/2017   Procedure: BIOPSY;  Surgeon: Danie Binder, MD;  [facility];  Service: Endoscopy;;  duodenal gastric   CHOLECYSTECTOMY     COLONOSCOPY WITH PROPOFOL N/A 08/04/2017   Procedure: COLONOSCOPY WITH PROPOFOL;  Surgeon: Danie Binder, MD;  [facility];  Service: Endoscopy;  Laterality: N/A;  8:30am   ESOPHAGOGASTRODUODENOSCOPY (EGD) WITH PROPOFOL N/A 08/04/2017   Procedure: ESOPHAGOGASTRODUODENOSCOPY (EGD) WITH PROPOFOL;  Surgeon: Danie Binder, MD;  [facility];  Service: Endoscopy;  Laterality: N/A;   FASCIECTOMY Right 10/17/2014   Procedure: FASCIECTOMY RIGHT RING/SMALL FINGERS;  Surgeon: Daryll Brod, MD;  Location: [facility];  Service: Orthopedics;  Laterality: Right;   KNEE ARTHROSCOPY     left   POLYPECTOMY  08/04/2017   Procedure: POLYPECTOMY;  Surgeon: Danie Binder, MD;  [facility];  Service: Endoscopy;;  colon   TUBAL LIGATION     TYMPANOSTOMY TUBE PLACEMENT     both ears     SOCIAL HISTORY:  Social History   Socioeconomic History   Marital status: Widowed    Spouse name: Not on file   Number  of children: Not on file   Years of education: Not on file   Highest education level: Not on file  Occupational History   Not on file  Tobacco Use   Smoking status: Former    Packs/day: 0.25    Years: 10.00    Total pack years: 2.50    Types: Cigarettes    Quit date: 10/10/1986    Years since quitting: 35.3   Smokeless tobacco: Never  Vaping Use   Vaping Use: Never used  Substance and Sexual Activity   Alcohol use: No   Drug use: No   Sexual activity: Not Currently    Birth  control/protection: Surgical  Other Topics Concern   Not on file  Social History Narrative   Not on file   Social Determinants of Health   Financial Resource Strain: Not on file  Food Insecurity: Not on file  Transportation Needs: Not on file  Physical Activity: Not on file  Stress: Not on file  Social Connections: Not on file  Intimate Partner Violence: Not on file    FAMILY HISTORY:  Family History  Problem Relation Age of Onset   Kidney cancer Mother    Colon polyps Sister    Colon cancer Neg Hx     CURRENT MEDICATIONS:  Outpatient Encounter Medications as of 01/21/2022  Medication Sig   albuterol (PROVENTIL HFA;VENTOLIN HFA) 108 (90 BASE) MCG/ACT inhaler Inhale into the lungs every 6 (six) hours as needed for wheezing or shortness of breath.   albuterol (PROVENTIL) (2.5 MG/3ML) 0.083% nebulizer solution Take 2.5 mg by nebulization every 6 (six) hours as needed for wheezing or shortness of breath.   aspirin EC 81 MG tablet Take 81 mg by mouth daily. Swallow whole.   calcitRIOL (ROCALTROL) 0.25 MCG capsule Take by mouth.   calcium carbonate (OS-CAL) 600 MG TABS tablet Take 600 mg by mouth. Doesn't take everyday   cholecalciferol (VITAMIN D) 1000 UNITS tablet Take 1,000 Units by mouth daily.   diltiazem (CARDIZEM CD) 300 MG 24 hr capsule Take 1 capsule (300 mg total) by mouth daily.   esomeprazole (NEXIUM) 40 MG capsule 1 PO 30 MINS PRIOR TO FIRST MEAL   furosemide (LASIX) 40 MG tablet Take 1 tablet (40 mg total) by mouth every other day. (May take one extra tab as needed for weight gain of 3 lbs / 24 hours OR 5 lbs / 1 week.)   gabapentin (NEURONTIN) 400 MG capsule Take 400 mg by mouth 3 (three) times daily.   hydrOXYzine (ATARAX/VISTARIL) 25 MG tablet Take 25 mg by mouth at bedtime.   MAGNESIUM PO Take by mouth. Twice a week   metoprolol tartrate (LOPRESSOR) 25 MG tablet Take 0.5 tablets (12.5 mg total) by mouth 2 (two) times daily.   montelukast (SINGULAIR) 10 MG tablet  Take 10 mg by mouth at bedtime.   potassium chloride SA (KLOR-CON) 20 MEQ tablet Take 1 tablet (20 mEq total) by mouth daily.   vitamin A 3 MG (10000 UNITS) capsule Take by mouth.   vitamin B-12 (CYANOCOBALAMIN) 1000 MCG tablet Take 1,000 mcg by mouth daily.   vitamin C (ASCORBIC ACID) 500 MG tablet Take 500 mg by mouth daily.   No facility-administered encounter medications on file as of 01/21/2022.    ALLERGIES:  Allergies  Allergen Reactions   Penicillins Rash    Has patient had a PCN reaction causing immediate rash, facial/tongue/throat swelling, SOB or lightheadedness with hypotension: No Has patient had a PCN reaction causing severe rash  involving mucus membranes or skin necrosis: No Has patient had a PCN reaction that required hospitalization: No Has patient had a PCN reaction occurring within the last 10 years: No If all of the above answers are "NO", then may proceed with Cephalosporin use.      PHYSICAL EXAM:  ECOG PERFORMANCE STATUS: 2 - Symptomatic, <50% confined to bed  There were no vitals filed for this visit. There were no vitals filed for this visit. Physical Exam Vitals reviewed.  Constitutional:      Appearance: Normal appearance.     Interventions: Nasal cannula in place.  Cardiovascular:     Rate and Rhythm: Normal rate and regular rhythm.     Pulses: Normal pulses.     Heart sounds: Normal heart sounds.  Pulmonary:     Effort: Pulmonary effort is normal.     Breath sounds: Decreased air movement present. Decreased breath sounds present.  Musculoskeletal:     Right lower leg: No edema.     Left lower leg: No edema.  Neurological:     General: No focal deficit present.     Mental Status: She is alert and oriented to person, place, and time.  Psychiatric:        Mood and Affect: Mood normal.        Behavior: Behavior normal.    LABORATORY DATA:  I have reviewed the labs as listed.  CBC    Component Value Date/Time   WBC 11.2 (H) 10/17/2020 1616    RBC 3.43 (L) 10/17/2020 1616   HGB 8.6 (L) 10/17/2020 1616   HCT 29.7 (L) 10/17/2020 1616   PLT 292 10/17/2020 1616   MCV 86.6 10/17/2020 1616   MCH 25.1 (L) 10/17/2020 1616   MCHC 29.0 (L) 10/17/2020 1616   RDW 16.8 (H) 10/17/2020 1616   LYMPHSABS 1.5 10/17/2020 1616   MONOABS 0.5 10/17/2020 1616   EOSABS 0.8 (H) 10/17/2020 1616   BASOSABS 0.1 10/17/2020 1616      Latest Ref Rng & Units 12/27/2021    1:29 PM 10/17/2020    4:16 PM 07/29/2017    1:58 PM  CMP  Glucose 70 - 99 mg/dL 131  113  174   BUN 8 - 23 mg/dL '23  23  14   '$ Creatinine 0.44 - 1.00 mg/dL 1.52  1.44  1.12   Sodium 135 - 145 mmol/L 138  141  138   Potassium 3.5 - 5.1 mmol/L 4.1  4.2  4.0   Chloride 98 - 111 mmol/L 103  105  101   CO2 22 - 32 mmol/L '29  26  29   '$ Calcium 8.9 - 10.3 mg/dL 9.1  9.3  8.9   Total Protein 6.5 - 8.1 g/dL 7.1  7.8    Total Bilirubin 0.3 - 1.2 mg/dL 0.7  0.6    Alkaline Phos 38 - 126 U/L 90  121    AST 15 - 41 U/L 23  23    ALT 0 - 44 U/L 12  14      DIAGNOSTIC IMAGING:  I have independently reviewed the relevant imaging and discussed with the patient.  ASSESSMENT & PLAN: 1.  Abnormal SPEP with polyclonal colopathy: - Patient seen at the request of Dr. Theador Hawthorne. - Labs from 03/20/2021 significant for SPEP with faint restricted band in the gamma region.  Serum immunofixation showed faint band of IgG kappa. - Hematology work-up (12/27/2021): Immunofixation shows polyclonal increase in immunoglobulins SPEP negative for M spike or monoclonal protein  Elevated kappa light chain 70.9, elevated lambda 37.9, elevated ratio 1.87.  This is in keeping with her CKD. Normal LDH.  Mildly elevated beta-2 microglobulin 5.7. - No CRAB features at this time.  Creatinine 1.52, stable at her baseline CKD stage IIIb.  Hgb 11.2, calcium 9.1. - Skeletal survey (12/27/2021): No focal lytic lesions identified - No new bone pains.  No B symptoms.  Has been on oxygen for the last 5 years. - PLAN: No evidence of  monoclonal plasma cell dyscrasia at this time.  We will repeat MGUS/myeloma panel every 6 months for the next year.  If no abnormalities over that time, we will likely discharge from clinic in 1 year.  2.  Social/family history: - She lives by herself at home and is independent of ADLs and IADLs.  She worked in the [employer] prior to retirement.  Quit smoking 25 years ago. - Mother had kidney cancer and brother had kidney cancer.   PLAN SUMMARY & DISPOSITION: Labs in 6 months RTC 1 week after labs  All questions were answered. The patient knows to call the clinic with any problems, questions or concerns.  Medical decision making: Low  Time spent on visit: I spent 15 minutes counseling the patient face to face. The total time spent in the appointment was 25 minutes and more than 50% was on counseling.   Harriett Rush, PA-C  01/21/2022 11:08 PM

## 2022-01-21 ENCOUNTER — Inpatient Hospital Stay (HOSPITAL_COMMUNITY): Payer: Medicare PPO | Attending: Hematology | Admitting: Physician Assistant

## 2022-01-21 VITALS — HR 78 | Temp 98.1°F | Resp 18 | Ht 68.0 in | Wt 173.8 lb

## 2022-01-21 DIAGNOSIS — R7989 Other specified abnormal findings of blood chemistry: Secondary | ICD-10-CM | POA: Diagnosis not present

## 2022-01-21 DIAGNOSIS — R778 Other specified abnormalities of plasma proteins: Secondary | ICD-10-CM | POA: Diagnosis not present

## 2022-01-21 DIAGNOSIS — D890 Polyclonal hypergammaglobulinemia: Secondary | ICD-10-CM

## 2022-01-21 DIAGNOSIS — Z79899 Other long term (current) drug therapy: Secondary | ICD-10-CM | POA: Insufficient documentation

## 2022-01-21 NOTE — Patient Instructions (Signed)
[place] at [facility] Discharge Instructions  You were seen today by Tarri Abernethy PA-C for your abnormal protein tests.  As we discussed, you have some elevations in your immunoglobulin protein.  If you had elevations in only one type of immunoglobulin protein, this would be concerning for a possible type of cancer called multiple myeloma.  However, you have elevations in multiple types of your immunoglobulin protein which is NOT concerning for cancer, but is likely caused by inflammation in your body.  This condition is known as "polyclonal gammopathy."  We will repeat your labs again in 6 months to make sure you do not have any major changes.  I will see you for follow-up visit in 6 months, approximately 1 week after labs.   Thank you for choosing [place] at [facility] to provide your oncology and hematology care.  To afford each patient quality time with our provider, please arrive at least 15 minutes before your scheduled appointment time.   If you have a lab appointment with the [facility] please come in thru the Main Entrance and check in at the main information desk.  You need to re-schedule your appointment should you arrive 10 or more minutes late.  We strive to give you quality time with our providers, and arriving late affects you and other patients whose appointments are after yours.  Also, if you no show three or more times for appointments you may be dismissed from the clinic at the providers discretion.     Again, thank you for choosing [facility].  Our hope is that these requests will decrease the amount of time that you wait before being seen by our physicians.       _____________________________________________________________  Should you have questions after your visit to [facility], please contact our office at [phone] and follow the prompts.  Our office hours are 8:00 a.m. and 4:30  p.m. Monday - Friday.  Please note that voicemails left after 4:00 p.m. may not be returned until the following business day.  We are closed weekends and major holidays.  You do have access to a nurse 24-7, just call the main number to the clinic [phone] and do not press any options, hold on the line and a nurse will answer the phone.    For prescription refill requests, have your pharmacy contact our office and allow 72 hours.    Due to Covid, you will need to wear a mask upon entering the hospital. If you do not have a mask, a mask will be given to you at the Main Entrance upon arrival. For doctor visits, patients may have 1 support person age 25 or older with them. For treatment visits, patients can not have anyone with them due to social distancing guidelines and our immunocompromised population.

## 2022-01-31 DIAGNOSIS — J449 Chronic obstructive pulmonary disease, unspecified: Secondary | ICD-10-CM | POA: Diagnosis not present

## 2022-02-07 ENCOUNTER — Telehealth: Payer: Self-pay

## 2022-02-07 NOTE — Telephone Encounter (Signed)
   Pre-operative Risk Assessment    Patient Name: Amber Hull  DOB: 11-03-1931 MRN: [id number]      Request for Surgical Clearance    Procedure:   Phaco w/PC IOL  Date of Surgery:  Clearance TBD                                 Surgeon:  Dr. Mali Albright Surgeon's Group or Practice Name:  [facility]  Phone number:  [place]   Fax number:  [phone]   Type of Clearance Requested:   - Medical    Type of Anesthesia:  Local with light anesthesia   Additional requests/questions:   Medical clearance and most recent office visit notes are required  Signed, Sung Amabile   02/07/2022, 3:32 PM

## 2022-02-07 NOTE — Telephone Encounter (Signed)
   Patient Name: Amber Hull  DOB: 09/09/1931 MRN: [id number]  Primary Cardiologist: Carlyle Dolly, MD  Chart reviewed as part of pre-operative protocol coverage. Cataract extractions are recognized in guidelines as low risk surgeries that do not typically require specific preoperative testing or holding of blood thinner therapy. Therefore, given past medical history and time since last visit, based on ACC/AHA guidelines, Avice Llana Deshazo would be at acceptable risk for the planned procedure without further cardiovascular testing.   I will route this recommendation along with patient's most recent office visit note to the requesting party via Epic fax function and remove from pre-op pool.  Please call with questions.  Lenna Sciara, NP 02/07/2022, 4:17 PM

## 2022-02-10 DIAGNOSIS — M25561 Pain in right knee: Secondary | ICD-10-CM | POA: Diagnosis not present

## 2022-02-10 DIAGNOSIS — J849 Interstitial pulmonary disease, unspecified: Secondary | ICD-10-CM | POA: Diagnosis not present

## 2022-02-10 DIAGNOSIS — J449 Chronic obstructive pulmonary disease, unspecified: Secondary | ICD-10-CM | POA: Diagnosis not present

## 2022-02-10 DIAGNOSIS — I509 Heart failure, unspecified: Secondary | ICD-10-CM | POA: Diagnosis not present

## 2022-02-10 DIAGNOSIS — D649 Anemia, unspecified: Secondary | ICD-10-CM | POA: Diagnosis not present

## 2022-02-10 DIAGNOSIS — R030 Elevated blood-pressure reading, without diagnosis of hypertension: Secondary | ICD-10-CM | POA: Diagnosis not present

## 2022-02-10 DIAGNOSIS — I4891 Unspecified atrial fibrillation: Secondary | ICD-10-CM | POA: Diagnosis not present

## 2022-02-10 DIAGNOSIS — N184 Chronic kidney disease, stage 4 (severe): Secondary | ICD-10-CM | POA: Diagnosis not present

## 2022-02-15 DIAGNOSIS — D472 Monoclonal gammopathy: Secondary | ICD-10-CM | POA: Diagnosis not present

## 2022-02-15 DIAGNOSIS — I5033 Acute on chronic diastolic (congestive) heart failure: Secondary | ICD-10-CM | POA: Diagnosis not present

## 2022-02-15 DIAGNOSIS — E211 Secondary hyperparathyroidism, not elsewhere classified: Secondary | ICD-10-CM | POA: Diagnosis not present

## 2022-02-15 DIAGNOSIS — D638 Anemia in other chronic diseases classified elsewhere: Secondary | ICD-10-CM | POA: Diagnosis not present

## 2022-02-15 DIAGNOSIS — N184 Chronic kidney disease, stage 4 (severe): Secondary | ICD-10-CM | POA: Diagnosis not present

## 2022-02-15 DIAGNOSIS — I129 Hypertensive chronic kidney disease with stage 1 through stage 4 chronic kidney disease, or unspecified chronic kidney disease: Secondary | ICD-10-CM | POA: Diagnosis not present

## 2022-02-25 ENCOUNTER — Institutional Professional Consult (permissible substitution): Payer: Medicare PPO | Admitting: Internal Medicine

## 2022-03-03 DIAGNOSIS — J449 Chronic obstructive pulmonary disease, unspecified: Secondary | ICD-10-CM | POA: Diagnosis not present

## 2022-03-11 DIAGNOSIS — J9611 Chronic respiratory failure with hypoxia: Secondary | ICD-10-CM | POA: Diagnosis not present

## 2022-03-11 DIAGNOSIS — J449 Chronic obstructive pulmonary disease, unspecified: Secondary | ICD-10-CM | POA: Diagnosis not present

## 2022-03-11 DIAGNOSIS — Z8709 Personal history of other diseases of the respiratory system: Secondary | ICD-10-CM | POA: Diagnosis not present

## 2022-03-11 DIAGNOSIS — R0609 Other forms of dyspnea: Secondary | ICD-10-CM | POA: Diagnosis not present

## 2022-03-14 NOTE — Telephone Encounter (Signed)
Our office received a 2 copies of a duplicate clearance request. Our office did fax clearance notes on 02/07/22. I will re-fax these notes again today.

## 2022-04-03 DIAGNOSIS — J449 Chronic obstructive pulmonary disease, unspecified: Secondary | ICD-10-CM | POA: Diagnosis not present

## 2022-04-24 DIAGNOSIS — Z87891 Personal history of nicotine dependence: Secondary | ICD-10-CM | POA: Diagnosis not present

## 2022-04-24 DIAGNOSIS — R0600 Dyspnea, unspecified: Secondary | ICD-10-CM | POA: Diagnosis not present

## 2022-04-24 DIAGNOSIS — Z8709 Personal history of other diseases of the respiratory system: Secondary | ICD-10-CM | POA: Diagnosis not present

## 2022-04-24 DIAGNOSIS — Z86711 Personal history of pulmonary embolism: Secondary | ICD-10-CM | POA: Diagnosis not present

## 2022-04-24 DIAGNOSIS — J9611 Chronic respiratory failure with hypoxia: Secondary | ICD-10-CM | POA: Diagnosis not present

## 2022-05-03 DIAGNOSIS — J449 Chronic obstructive pulmonary disease, unspecified: Secondary | ICD-10-CM | POA: Diagnosis not present

## 2022-05-14 DIAGNOSIS — H2513 Age-related nuclear cataract, bilateral: Secondary | ICD-10-CM | POA: Diagnosis not present

## 2022-05-21 DIAGNOSIS — H2511 Age-related nuclear cataract, right eye: Secondary | ICD-10-CM | POA: Diagnosis not present

## 2022-06-03 DIAGNOSIS — J449 Chronic obstructive pulmonary disease, unspecified: Secondary | ICD-10-CM | POA: Diagnosis not present

## 2022-06-11 ENCOUNTER — Ambulatory Visit: Payer: Medicare PPO | Attending: Cardiology | Admitting: Cardiology

## 2022-06-11 ENCOUNTER — Encounter: Payer: Self-pay | Admitting: Cardiology

## 2022-06-11 VITALS — BP 135/70 | HR 82 | Ht 68.0 in | Wt 176.4 lb

## 2022-06-11 DIAGNOSIS — I050 Rheumatic mitral stenosis: Secondary | ICD-10-CM | POA: Diagnosis not present

## 2022-06-11 DIAGNOSIS — N184 Chronic kidney disease, stage 4 (severe): Secondary | ICD-10-CM | POA: Diagnosis not present

## 2022-06-11 DIAGNOSIS — I5032 Chronic diastolic (congestive) heart failure: Secondary | ICD-10-CM

## 2022-06-11 DIAGNOSIS — I4891 Unspecified atrial fibrillation: Secondary | ICD-10-CM

## 2022-06-11 NOTE — Patient Instructions (Signed)

## 2022-06-11 NOTE — Progress Notes (Signed)
Clinical Summary Ms. Veach is a 88 y.o.female seen today for follow up of the following medical problems.    Afib   -off eliquis due to recurrent issues with anemia - no recent palpitations - compliant with meds   - no recent palpitations   2. Chronic diastolic HF - 11/275 echo LVEF 60-65%, no WMAs, indet dd, mod MR, mod MS mean grad 5.5 - some occasional LE edema - last Cr 1.8  - taking her lasix just as needed per her report.      3.Mitral stenosis - 12/2020 mod MS mean grade 5.5     4. Anemia - followed by nephrology, pcp     5. Chronic respiratory failure - on home O2, she is on 4-5 L chronically   6. CKD - followed by Dr Theador Hawthorne  7.Chest pain - sharp pain left breast, about 1-2 times per week. Lasts about a minute - difficult historian. Does not sound cardiac   Past Medical History:  Diagnosis Date   Anemia    Arthritis    Asthma    Atrial fibrillation (HCC)    CHF (congestive heart failure) (HCC)    COPD (chronic obstructive pulmonary disease) ([place])    History of nuclear stress test 2017   intermediate risk   History of shingles    HOH (hard of hearing)    Mitral stenosis    Neuralgia    On home O2    3L N/C     Allergies  Allergen Reactions   Penicillins Rash    Has patient had a PCN reaction causing immediate rash, facial/tongue/throat swelling, SOB or lightheadedness with hypotension: No Has patient had a PCN reaction causing severe rash involving mucus membranes or skin necrosis: No Has patient had a PCN reaction that required hospitalization: No Has patient had a PCN reaction occurring within the last 10 years: No If all of the above answers are "NO", then may proceed with Cephalosporin use.      Current Outpatient Medications  Medication Sig Dispense Refill   albuterol (PROVENTIL HFA;VENTOLIN HFA) 108 (90 BASE) MCG/ACT inhaler Inhale into the lungs every 6 (six) hours as needed for wheezing or shortness of breath.      albuterol (PROVENTIL) (2.5 MG/3ML) 0.083% nebulizer solution Take 2.5 mg by nebulization every 6 (six) hours as needed for wheezing or shortness of breath.     calcitRIOL (ROCALTROL) 0.25 MCG capsule Take 0.25 mcg by mouth 3 (three) times a week.     calcium carbonate (OS-CAL) 600 MG TABS tablet Take 600 mg by mouth. Doesn't take everyday     cholecalciferol (VITAMIN D) 1000 UNITS tablet Take 1,000 Units by mouth daily.     diltiazem (CARDIZEM CD) 300 MG 24 hr capsule Take 1 capsule (300 mg total) by mouth daily. 90 capsule 3   esomeprazole (NEXIUM) 40 MG capsule 1 PO 30 MINS PRIOR TO FIRST MEAL 30 capsule 11   furosemide (LASIX) 40 MG tablet Take 1 tablet (40 mg total) by mouth every other day. (May take one extra tab as needed for weight gain of 3 lbs / 24 hours OR 5 lbs / 1 week.)     gabapentin (NEURONTIN) 400 MG capsule Take 400 mg by mouth 3 (three) times daily.     hydrOXYzine (ATARAX/VISTARIL) 25 MG tablet Take 25 mg by mouth at bedtime.     MAGNESIUM PO Take 1 tablet by mouth daily.     metoprolol tartrate (LOPRESSOR) 25 MG  tablet Take 0.5 tablets (12.5 mg total) by mouth 2 (two) times daily. 30 tablet 6   montelukast (SINGULAIR) 10 MG tablet Take 10 mg by mouth at bedtime.     potassium chloride SA (KLOR-CON) 20 MEQ tablet Take 1 tablet (20 mEq total) by mouth daily. 15 tablet 0   vitamin A 3 MG (10000 UNITS) capsule Take by mouth.     vitamin B-12 (CYANOCOBALAMIN) 1000 MCG tablet Take 1,000 mcg by mouth daily.     vitamin C (ASCORBIC ACID) 500 MG tablet Take 500 mg by mouth daily.     aspirin EC 81 MG tablet Take 81 mg by mouth daily. Swallow whole. (Patient not taking: Reported on 06/11/2022)     No current facility-administered medications for this visit.     Past Surgical History:  Procedure Laterality Date   ABDOMINAL HYSTERECTOMY     BIOPSY  08/04/2017   Procedure: BIOPSY;  Surgeon: Danie Binder, MD;  [facility]: [facility];  Service: Endoscopy;;  duodenal gastric    CHOLECYSTECTOMY     COLONOSCOPY WITH PROPOFOL N/A 08/04/2017   Procedure: COLONOSCOPY WITH PROPOFOL;  Surgeon: Danie Binder, MD;  [facility]: [facility];  Service: Endoscopy;  Laterality: N/A;  8:30am   ESOPHAGOGASTRODUODENOSCOPY (EGD) WITH PROPOFOL N/A 08/04/2017   Procedure: ESOPHAGOGASTRODUODENOSCOPY (EGD) WITH PROPOFOL;  Surgeon: Danie Binder, MD;  [facility];  Service: Endoscopy;  Laterality: N/A;   FASCIECTOMY Right 10/17/2014   Procedure: FASCIECTOMY RIGHT RING/SMALL FINGERS;  Surgeon: Daryll Brod, MD;  Location: [facility];  Service: Orthopedics;  Laterality: Right;   KNEE ARTHROSCOPY     left   POLYPECTOMY  08/04/2017   Procedure: POLYPECTOMY;  Surgeon: Danie Binder, MD;  [facility];  Service: Endoscopy;;  colon   TUBAL LIGATION     TYMPANOSTOMY TUBE PLACEMENT     both ears     Allergies  Allergen Reactions   Penicillins Rash    Has patient had a PCN reaction causing immediate rash, facial/tongue/throat swelling, SOB or lightheadedness with hypotension: No Has patient had a PCN reaction causing severe rash involving mucus membranes or skin necrosis: No Has patient had a PCN reaction that required hospitalization: No Has patient had a PCN reaction occurring within the last 10 years: No If all of the above answers are "NO", then may proceed with Cephalosporin use.       Family History  Problem Relation Age of Onset   Kidney cancer Mother    Colon polyps Sister    Colon cancer Neg Hx      Social History Ms. Gose reports that she quit smoking about 35 years ago. Her smoking use included cigarettes. She has a 2.50 pack-year smoking history. She has never used smokeless tobacco. Ms. Dickard reports no history of alcohol use.   Review of Systems CONSTITUTIONAL: No weight loss, fever, chills, weakness or fatigue.  HEENT: Eyes: No visual loss, blurred vision, double vision or yellow sclerae.No hearing loss, sneezing,  congestion, runny nose or sore throat.  SKIN: No rash or itching.  CARDIOVASCULAR: per hpi RESPIRATORY: No shortness of breath, cough or sputum.  GASTROINTESTINAL: No anorexia, nausea, vomiting or diarrhea. No abdominal pain or blood.  GENITOURINARY: No burning on urination, no polyuria NEUROLOGICAL: No headache, dizziness, syncope, paralysis, ataxia, numbness or tingling in the extremities. No change in bowel or bladder control.  MUSCULOSKELETAL: No muscle, back pain, joint pain or stiffness.  LYMPHATICS: No enlarged nodes. No history of  splenectomy.  PSYCHIATRIC: No history of depression or anxiety.  ENDOCRINOLOGIC: No reports of sweating, cold or heat intolerance. No polyuria or polydipsia.  Marland Kitchen   Physical Examination Today's Vitals   06/11/22 1427  BP: (!) 142/70  Pulse: 82  SpO2: 95%  Weight: 176 lb 6.4 oz (80 kg)  Height: '5\' 8"'$  (1.727 m)   Body mass index is 26.82 kg/m.  Gen: resting comfortably, no acute distress HEENT: no scleral icterus, pupils equal round and reactive, no palptable cervical adenopathy,  CV: irreg, no m/rg, no jvd Resp: Clear to auscultation bilaterally GI: abdomen is soft, non-tender, non-distended, normal bowel sounds, no hepatosplenomegaly MSK: extremities are warm,1+ bilateral LE edema Skin: warm, no rash Neuro:  no focal deficits Psych: appropriate affect   Diagnostic Studies 12/2020 echo IMPRESSIONS     1. Left ventricular ejection fraction, by estimation, is 60 to 65%. The  left ventricle has normal function. The left ventricle has no regional  wall motion abnormalities. Left ventricular diastolic parameters are  indeterminate.   2. RV-RA gradient 36 mmHg suggesting at least mildly increased RVSP.  Right ventricular systolic function is normal. The right ventricular size  is normal.   3. Left atrial size was mildly dilated.   4. The mitral valve appears rheumatic, mildly thickened with restricted  leaflet motion. Moderate mitral valve  regurgitation. Moderate mitral  stenosis. The mean mitral valve gradient is 5.5 mmHg. Valve area 1.25 cm2  by planimetry.   5. The aortic valve is tricuspid. There is mild calcification of the  aortic valve. Aortic valve regurgitation is not visualized.   6. Unable to estimate CVP.     Assessment and Plan   1.AFib - off anticoag due to recurrent issues with anemia. - no symptoms, continue current meds    2. Chronic diastolic HF - some signs of fluid overload> has been taking her lasix just when she feels she needs it, encouraged to take more regularly.    3. Moderate mitral stenosis -not a candidate for any intervention gived advanced age and multiple advanced comorbidities. Would not continue surveillance echos at this time.             Arnoldo Lenis, M.D.

## 2022-06-18 DIAGNOSIS — H2512 Age-related nuclear cataract, left eye: Secondary | ICD-10-CM | POA: Diagnosis not present

## 2022-07-03 DIAGNOSIS — J449 Chronic obstructive pulmonary disease, unspecified: Secondary | ICD-10-CM | POA: Diagnosis not present

## 2022-07-17 ENCOUNTER — Inpatient Hospital Stay: Payer: Medicare PPO | Attending: Hematology

## 2022-07-17 DIAGNOSIS — R7989 Other specified abnormal findings of blood chemistry: Secondary | ICD-10-CM | POA: Insufficient documentation

## 2022-07-17 DIAGNOSIS — R778 Other specified abnormalities of plasma proteins: Secondary | ICD-10-CM

## 2022-07-17 DIAGNOSIS — Z79899 Other long term (current) drug therapy: Secondary | ICD-10-CM | POA: Diagnosis not present

## 2022-07-17 DIAGNOSIS — D890 Polyclonal hypergammaglobulinemia: Secondary | ICD-10-CM

## 2022-07-17 LAB — CBC WITH DIFFERENTIAL/PLATELET
Abs Immature Granulocytes: 0 10*3/uL (ref 0.00–0.07)
Band Neutrophils: 0 %
Basophils Absolute: 0 10*3/uL (ref 0.0–0.1)
Basophils Relative: 0 %
Blasts: 0 %
Eosinophils Absolute: 2.4 10*3/uL — ABNORMAL HIGH (ref 0.0–0.5)
Eosinophils Relative: 21 %
HCT: 35.2 % — ABNORMAL LOW (ref 36.0–46.0)
Hemoglobin: 11.2 g/dL — ABNORMAL LOW (ref 12.0–15.0)
Lymphocytes Relative: 26 %
Lymphs Abs: 2.9 10*3/uL (ref 0.7–4.0)
MCH: 29.7 pg (ref 26.0–34.0)
MCHC: 31.8 g/dL (ref 30.0–36.0)
MCV: 93.4 fL (ref 80.0–100.0)
Metamyelocytes Relative: 0 %
Monocytes Absolute: 0.4 10*3/uL (ref 0.1–1.0)
Monocytes Relative: 4 %
Myelocytes: 0 %
Neutro Abs: 5.5 10*3/uL (ref 1.7–7.7)
Neutrophils Relative %: 49 %
Other: 0 %
Platelets: 215 10*3/uL (ref 150–400)
Promyelocytes Relative: 0 %
RBC: 3.77 MIL/uL — ABNORMAL LOW (ref 3.87–5.11)
RDW: 13.4 % (ref 11.5–15.5)
WBC: 11.2 10*3/uL — ABNORMAL HIGH (ref 4.0–10.5)
nRBC: 0 % (ref 0.0–0.2)
nRBC: 0 /100 WBC

## 2022-07-17 LAB — COMPREHENSIVE METABOLIC PANEL
ALT: 15 U/L (ref 0–44)
AST: 28 U/L (ref 15–41)
Albumin: 3.8 g/dL (ref 3.5–5.0)
Alkaline Phosphatase: 200 U/L — ABNORMAL HIGH (ref 38–126)
Anion gap: 11 (ref 5–15)
BUN: 31 mg/dL — ABNORMAL HIGH (ref 8–23)
CO2: 25 mmol/L (ref 22–32)
Calcium: 9.1 mg/dL (ref 8.9–10.3)
Chloride: 103 mmol/L (ref 98–111)
Creatinine, Ser: 1.92 mg/dL — ABNORMAL HIGH (ref 0.44–1.00)
GFR, Estimated: 25 mL/min — ABNORMAL LOW (ref >60)
Glucose, Bld: 108 mg/dL — ABNORMAL HIGH (ref 70–99)
Potassium: 4.2 mmol/L (ref 3.5–5.1)
Sodium: 139 mmol/L (ref 135–145)
Total Bilirubin: 0.5 mg/dL (ref 0.3–1.2)
Total Protein: 7.3 g/dL (ref 6.5–8.1)

## 2022-07-17 LAB — LACTATE DEHYDROGENASE: LDH: 192 U/L (ref 98–192)

## 2022-07-18 LAB — PATHOLOGIST SMEAR REVIEW

## 2022-07-18 LAB — KAPPA/LAMBDA LIGHT CHAINS
Kappa free light chain: 112.9 mg/L — ABNORMAL HIGH (ref 3.3–19.4)
Kappa, lambda light chain ratio: 2.01 — ABNORMAL HIGH (ref 0.26–1.65)
Lambda free light chains: 56.1 mg/L — ABNORMAL HIGH (ref 5.7–26.3)

## 2022-07-21 LAB — PROTEIN ELECTROPHORESIS, SERUM
A/G Ratio: 1.2 (ref 0.7–1.7)
Albumin ELP: 3.7 g/dL (ref 2.9–4.4)
Alpha-1-Globulin: 0.2 g/dL (ref 0.0–0.4)
Alpha-2-Globulin: 0.7 g/dL (ref 0.4–1.0)
Beta Globulin: 0.8 g/dL (ref 0.7–1.3)
Gamma Globulin: 1.4 g/dL (ref 0.4–1.8)
Globulin, Total: 3.1 g/dL (ref 2.2–3.9)
Total Protein ELP: 6.8 g/dL (ref 6.0–8.5)

## 2022-07-21 IMAGING — DX DG BONE SURVEY MET
9 of 10 series · 9 of 10 positions shown · non-contrast
Comparison: None Available.

CLINICAL DATA: MGUS

EXAM:
METASTATIC BONE SURVEY

[skull lat]
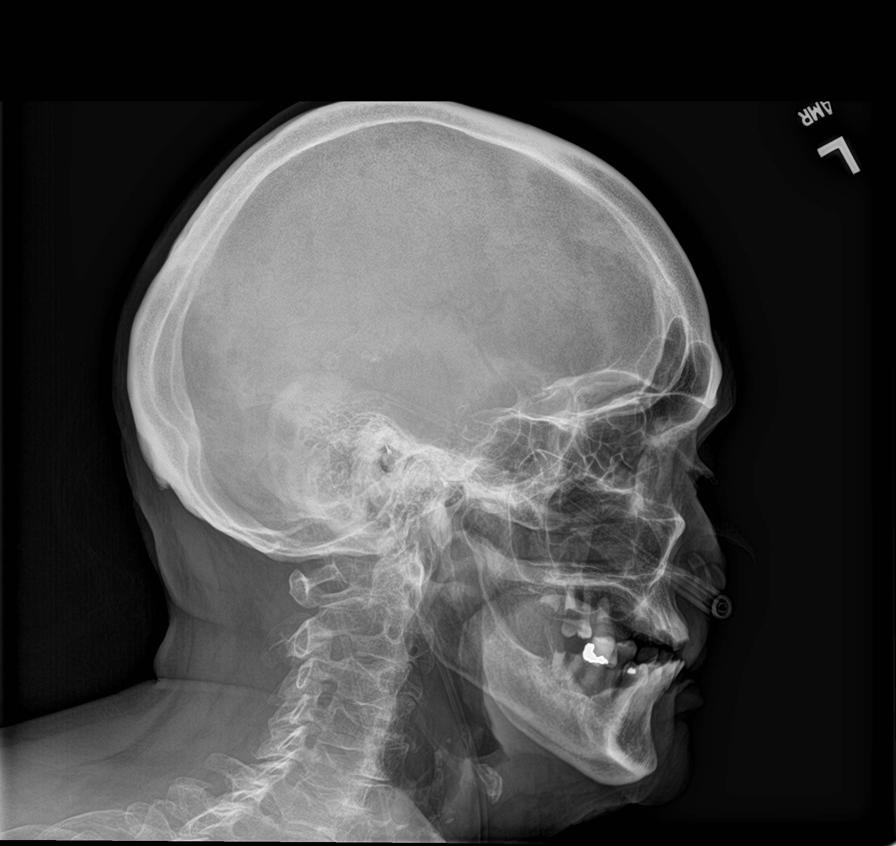

[shoulder ap (1 of 2)]
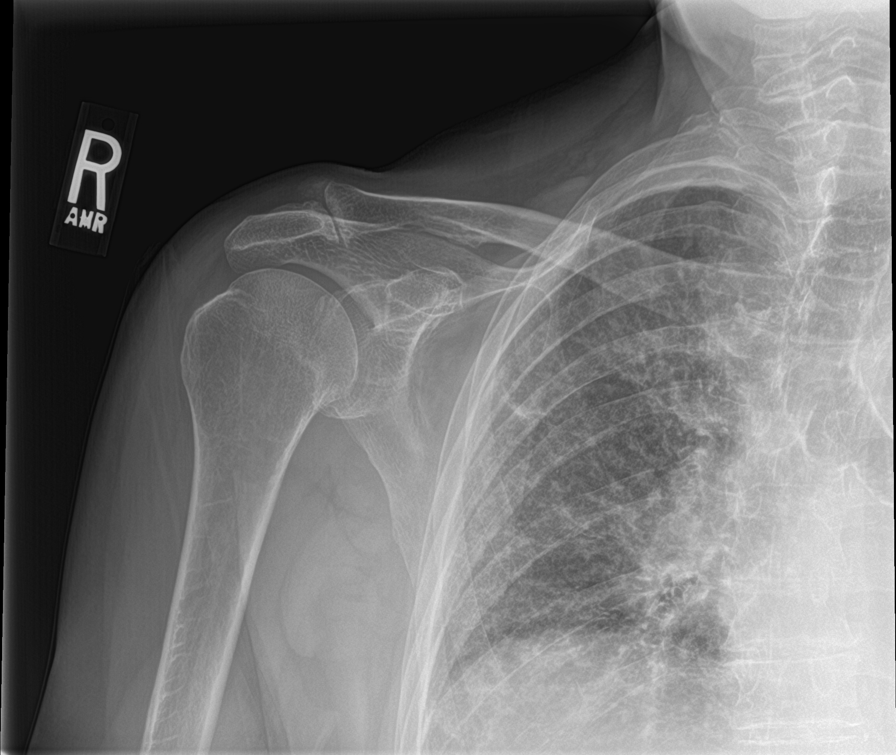

[shoulder ap (2 of 2)]
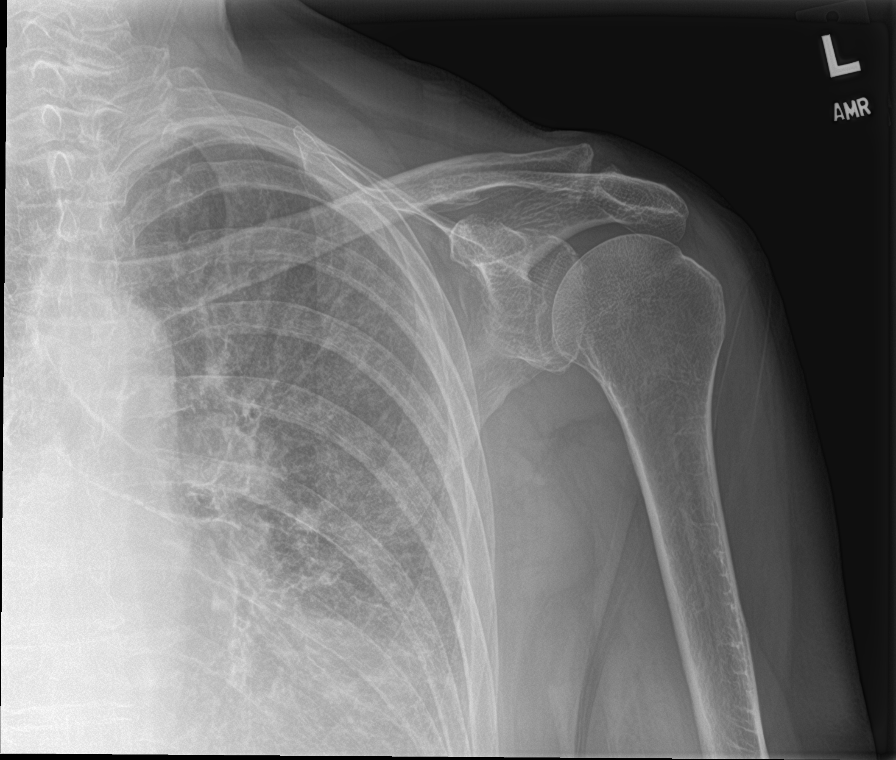

[humerus ap (1 of 2)]
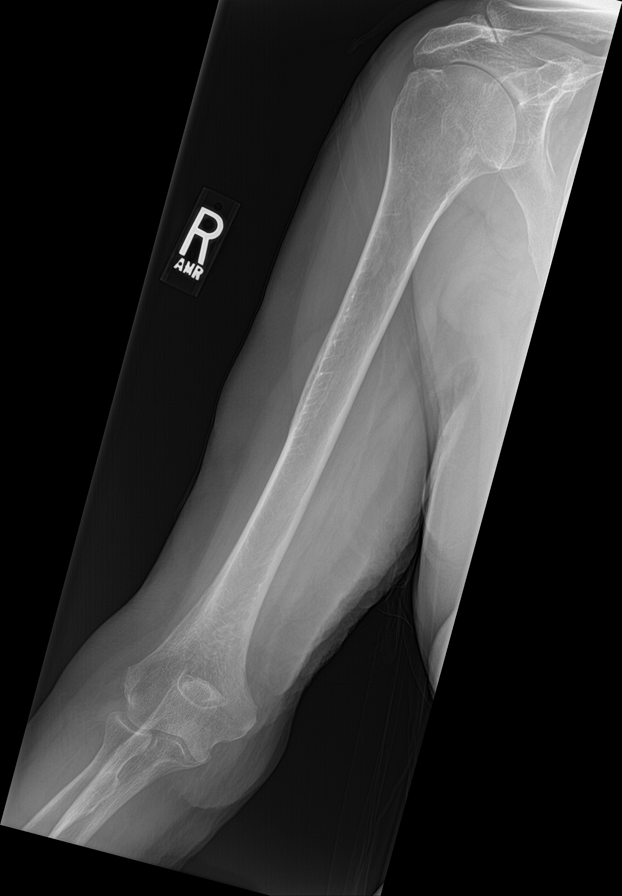

[humerus ap (2 of 2)]
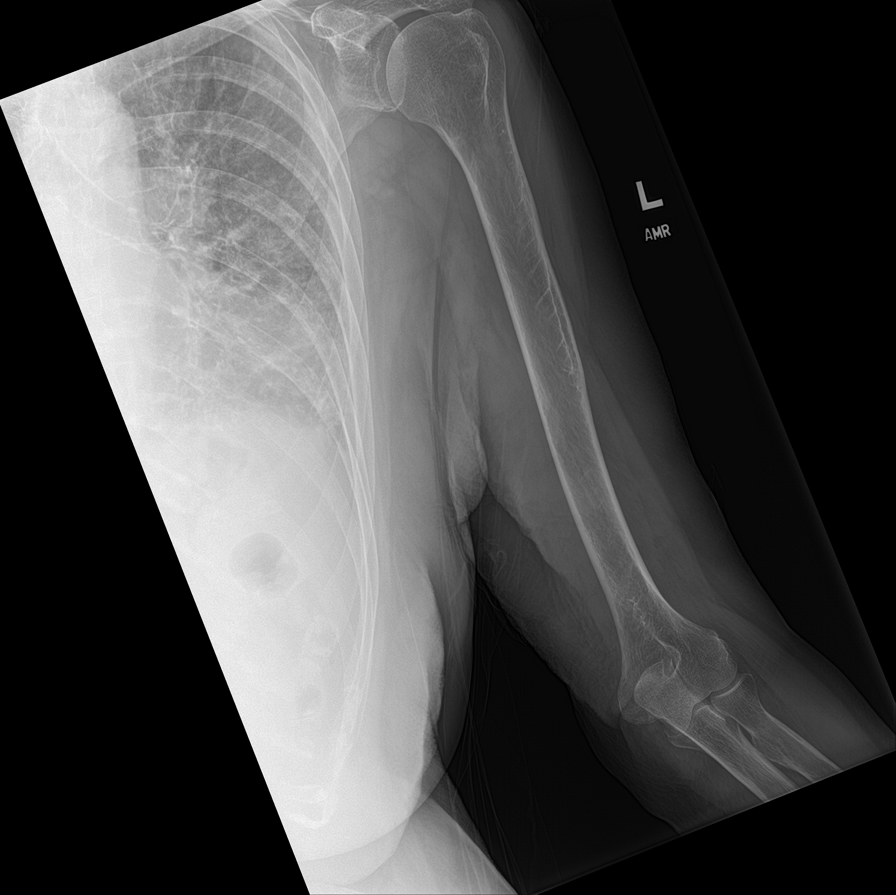

[forearm ap (1 of 2)]
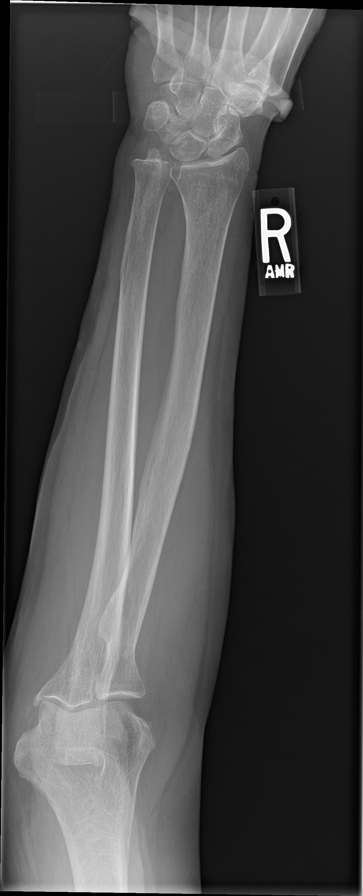

[forearm ap (2 of 2)]
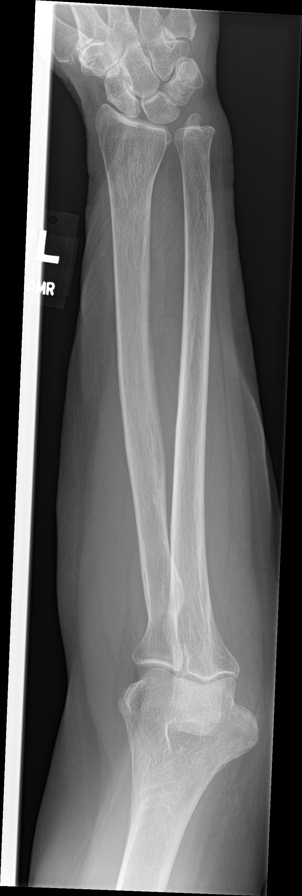

[c-spine ap]
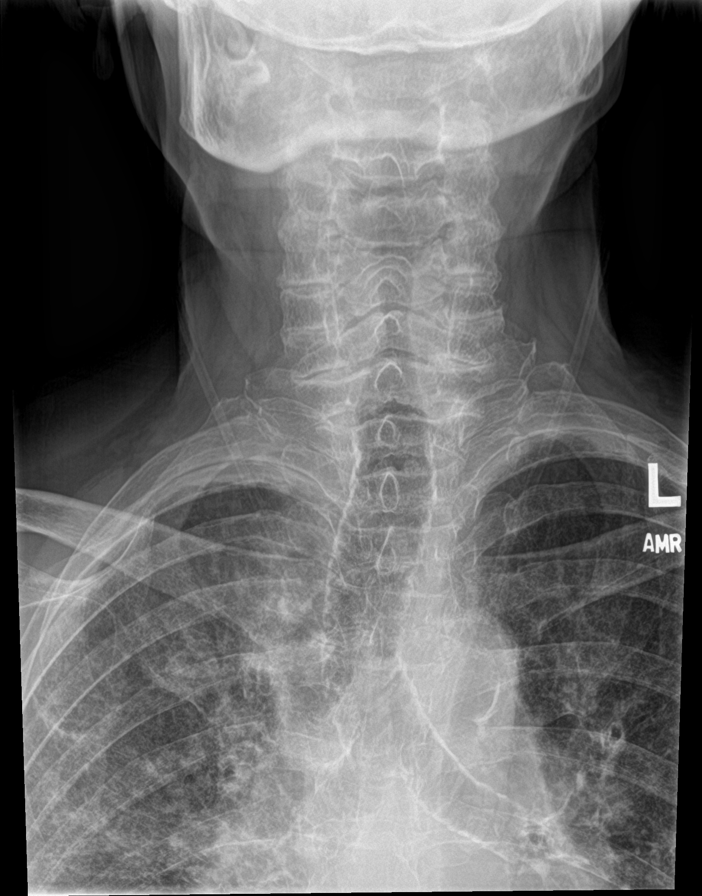

[c-spine lat]
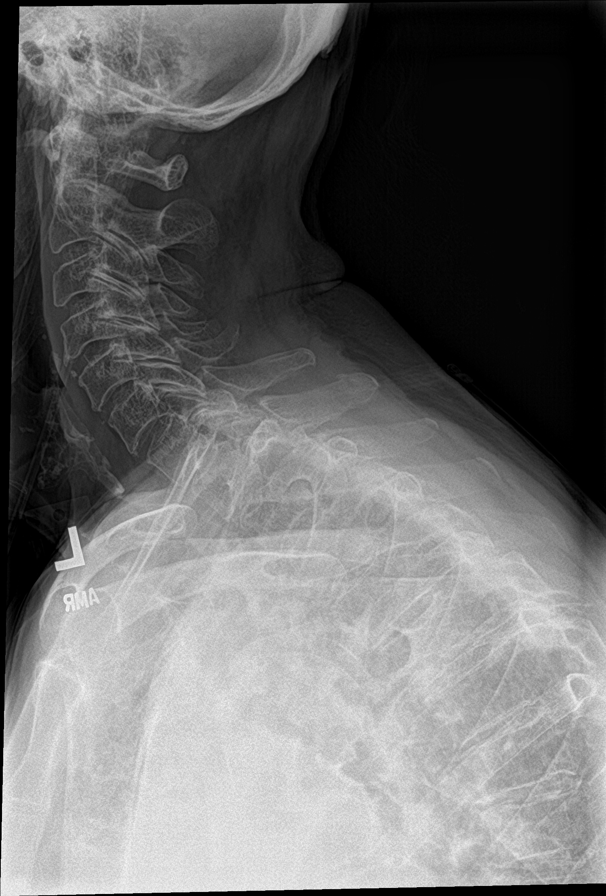

[9 of 10 positions shown; findings below may reference images not displayed]

FINDINGS: No focal lytic lesions are identified.

Mild degenerative changes within the right acromioclavicular
articulation. Diffuse pulmonary interstitial infiltrate appears
similar to prior chest radiograph of 10/17/2020 when accounting for
changes in radiographic technique. Lung volumes are small. Together,
these favor changes of chronic underlying interstitial lung disease,
not well characterized on this exam. Degenerative changes are noted
at the thoracolumbar junction and lumbosacral junction. Degenerative
changes are noted within the hips, right greater than left.
Degenerative changes are noted within the knees bilaterally.
IMPRESSION: 1. No focal lytic lesions are identified.
2. Diffuse pulmonary interstitial infiltrate, similar to prior chest
radiograph of 10/17/2020.

## 2022-07-23 NOTE — Progress Notes (Signed)
Unable to reach by phone for telehealth visit. This encounter was created in error - please disregard.

## 2022-07-24 ENCOUNTER — Inpatient Hospital Stay: Payer: Medicare PPO | Admitting: Physician Assistant

## 2022-07-24 ENCOUNTER — Encounter: Payer: Self-pay | Admitting: Physician Assistant

## 2022-07-24 ENCOUNTER — Telehealth: Payer: Self-pay | Admitting: Physician Assistant

## 2022-07-24 NOTE — Telephone Encounter (Signed)
Phone visit marked as "no-show."  Will attempt to reschedule this at the patient's convenience.

## 2022-07-28 LAB — IMMUNOFIXATION ELECTROPHORESIS
IgA: 150 mg/dL (ref 64–422)
IgG (Immunoglobin G), Serum: 1529 mg/dL (ref 586–1602)
IgM (Immunoglobulin M), Srm: 94 mg/dL (ref 26–217)
Total Protein ELP: 7 g/dL (ref 6.0–8.5)

## 2022-10-01 DIAGNOSIS — N184 Chronic kidney disease, stage 4 (severe): Secondary | ICD-10-CM | POA: Diagnosis not present

## 2022-10-03 DIAGNOSIS — E875 Hyperkalemia: Secondary | ICD-10-CM | POA: Diagnosis not present

## 2022-10-03 DIAGNOSIS — E211 Secondary hyperparathyroidism, not elsewhere classified: Secondary | ICD-10-CM | POA: Diagnosis not present

## 2022-10-03 DIAGNOSIS — I129 Hypertensive chronic kidney disease with stage 1 through stage 4 chronic kidney disease, or unspecified chronic kidney disease: Secondary | ICD-10-CM | POA: Diagnosis not present

## 2022-10-03 DIAGNOSIS — D472 Monoclonal gammopathy: Secondary | ICD-10-CM | POA: Diagnosis not present

## 2022-10-03 DIAGNOSIS — D638 Anemia in other chronic diseases classified elsewhere: Secondary | ICD-10-CM | POA: Diagnosis not present

## 2022-10-03 DIAGNOSIS — M79671 Pain in right foot: Secondary | ICD-10-CM | POA: Diagnosis not present

## 2022-10-03 DIAGNOSIS — I5032 Chronic diastolic (congestive) heart failure: Secondary | ICD-10-CM | POA: Diagnosis not present

## 2022-10-03 DIAGNOSIS — N1832 Chronic kidney disease, stage 3b: Secondary | ICD-10-CM | POA: Diagnosis not present

## 2022-11-29 ENCOUNTER — Other Ambulatory Visit: Payer: Self-pay | Admitting: Cardiology

## 2022-12-18 ENCOUNTER — Ambulatory Visit: Payer: Medicare PPO | Attending: Cardiology | Admitting: Cardiology

## 2022-12-18 ENCOUNTER — Encounter: Payer: Self-pay | Admitting: Cardiology

## 2022-12-18 VITALS — BP 144/86 | HR 87

## 2022-12-18 DIAGNOSIS — I4891 Unspecified atrial fibrillation: Secondary | ICD-10-CM | POA: Diagnosis not present

## 2022-12-18 DIAGNOSIS — R0789 Other chest pain: Secondary | ICD-10-CM | POA: Diagnosis not present

## 2022-12-18 DIAGNOSIS — I5033 Acute on chronic diastolic (congestive) heart failure: Secondary | ICD-10-CM

## 2022-12-18 MED ORDER — FUROSEMIDE 40 MG PO TABS: 40.0000 mg | ORAL_TABLET | Freq: Every day | ORAL | 3 refills | Status: DC | PRN

## 2022-12-18 MED ORDER — BISOPROLOL FUMARATE 5 MG PO TABS: 5.0000 mg | ORAL_TABLET | Freq: Every day | ORAL | 3 refills | Status: DC

## 2022-12-18 NOTE — Patient Instructions (Addendum)
Medication Instructions:  Your physician has recommended you make the following change in your medication:  Take furosemide 40 mg daily for 4 days, then change to daily as needed for leg swelling or worsening shortness of breath.  Stop metoprolol Start bisoprolol 5 mg daily Continue all other medications the same  Labwork: none  Testing/Procedures: none  Follow-Up: Your physician recommends that you schedule a follow-up appointment in: 1 month  Any Other Special Instructions Will Be Listed Below (If Applicable).  If you need a refill on your cardiac medications before your next appointment, please call your pharmacy.

## 2022-12-18 NOTE — Progress Notes (Signed)
Clinical Summary Amber Hull is a 89 y.o.female seen today for follow up of the following medical problems.    Afib  off eliquis due to recurrent issues with anemia - has had some recent palpitations    2. Chronic diastolic HF - 12/2020 echo LVEF 60-65%, no WMAs, indet dd, mod MR, mod MS mean grad 5.5 - some occasional LE edema - last Cr 1.8   - taking her lasix just as needed per her report.  - has not taken in some time.      3.Mitral stenosis - 12/2020 mod MS mean grade 5.5     4. Anemia - followed by nephrology, pcp     5. Chronic respiratory failure - on home O2, she is on 4-5 L chronically   6. CKD - followed by Dr Wolfgang Phoenix   7.Chest pain - sharp pain left breast, about 1-2 times per week. Lasts about a minute - difficult historian. Does not sound cardiac   - over the last week. Under left breast, aching pain. Can't degree of pain. +SOB. Not sure positional. Pain lasts 5-10 minutes. +wheezing. Rare cough - palpitations with activities Past Medical History:  Diagnosis Date   Anemia    Arthritis    Asthma    Atrial fibrillation (HCC)    CHF (congestive heart failure) (HCC)    COPD (chronic obstructive pulmonary disease) (HCC)    History of nuclear stress test 2017   intermediate risk   History of shingles    HOH (hard of hearing)    Mitral stenosis    Neuralgia    On home O2    3L N/C     Allergies  Allergen Reactions   Penicillins Rash    Has patient had a PCN reaction causing immediate rash, facial/tongue/throat swelling, SOB or lightheadedness with hypotension: No Has patient had a PCN reaction causing severe rash involving mucus membranes or skin necrosis: No Has patient had a PCN reaction that required hospitalization: No Has patient had a PCN reaction occurring within the last 10 years: No If all of the above answers are "NO", then may proceed with Cephalosporin use.      Current Outpatient Medications  Medication Sig Dispense  Refill   albuterol (PROVENTIL HFA;VENTOLIN HFA) 108 (90 BASE) MCG/ACT inhaler Inhale into the lungs every 6 (six) hours as needed for wheezing or shortness of breath.     albuterol (PROVENTIL) (2.5 MG/3ML) 0.083% nebulizer solution Take 2.5 mg by nebulization every 6 (six) hours as needed for wheezing or shortness of breath.     aspirin EC 81 MG tablet Take 81 mg by mouth daily. Swallow whole.     calcitRIOL (ROCALTROL) 0.25 MCG capsule Take 0.25 mcg by mouth 3 (three) times a week.     calcium carbonate (OS-CAL) 600 MG TABS tablet Take 600 mg by mouth. Doesn't take everyday     cholecalciferol (VITAMIN D) 1000 UNITS tablet Take 1,000 Units by mouth daily.     diltiazem (CARDIZEM CD) 300 MG 24 hr capsule Take 1 capsule by mouth once daily 90 capsule 1   esomeprazole (NEXIUM) 40 MG capsule 1 PO 30 MINS PRIOR TO FIRST MEAL 30 capsule 11   furosemide (LASIX) 40 MG tablet Take 1 tablet (40 mg total) by mouth every other day. (May take one extra tab as needed for weight gain of 3 lbs / 24 hours OR 5 lbs / 1 week.)     gabapentin (NEURONTIN) 400 MG capsule  Take 400 mg by mouth 3 (three) times daily.     hydrOXYzine (ATARAX/VISTARIL) 25 MG tablet Take 25 mg by mouth at bedtime.     MAGNESIUM PO Take 1 tablet by mouth daily.     montelukast (SINGULAIR) 10 MG tablet Take 10 mg by mouth at bedtime.     potassium chloride SA (KLOR-CON) 20 MEQ tablet Take 1 tablet (20 mEq total) by mouth daily. 15 tablet 0   vitamin A 3 MG (10000 UNITS) capsule Take by mouth.     vitamin B-12 (CYANOCOBALAMIN) 1000 MCG tablet Take 1,000 mcg by mouth daily.     vitamin C (ASCORBIC ACID) 500 MG tablet Take 500 mg by mouth daily.     No current facility-administered medications for this visit.     Past Surgical History:  Procedure Laterality Date   ABDOMINAL HYSTERECTOMY     BIOPSY  08/04/2017   Procedure: BIOPSY;  Surgeon: West Bali, MD;  [facility];  Service: Endoscopy;;  duodenal gastric    CHOLECYSTECTOMY     COLONOSCOPY WITH PROPOFOL N/A 08/04/2017   Procedure: COLONOSCOPY WITH PROPOFOL;  Surgeon: West Bali, MD;  [facility];  Service: Endoscopy;  Laterality: N/A;  8:30am   ESOPHAGOGASTRODUODENOSCOPY (EGD) WITH PROPOFOL N/A 08/04/2017   Procedure: ESOPHAGOGASTRODUODENOSCOPY (EGD) WITH PROPOFOL;  Surgeon: West Bali, MD;  [facility];  Service: Endoscopy;  Laterality: N/A;   FASCIECTOMY Right 10/17/2014   Procedure: FASCIECTOMY RIGHT RING/SMALL FINGERS;  Surgeon: Cindee Salt, MD;  Location: [facility];  Service: Orthopedics;  Laterality: Right;   KNEE ARTHROSCOPY     left   POLYPECTOMY  08/04/2017   Procedure: POLYPECTOMY;  Surgeon: West Bali, MD;  [facility];  Service: Endoscopy;;  colon   TUBAL LIGATION     TYMPANOSTOMY TUBE PLACEMENT     both ears     Allergies  Allergen Reactions   Penicillins Rash    Has patient had a PCN reaction causing immediate rash, facial/tongue/throat swelling, SOB or lightheadedness with hypotension: No Has patient had a PCN reaction causing severe rash involving mucus membranes or skin necrosis: No Has patient had a PCN reaction that required hospitalization: No Has patient had a PCN reaction occurring within the last 10 years: No If all of the above answers are "NO", then may proceed with Cephalosporin use.       Family History  Problem Relation Age of Onset   Kidney cancer Mother    Colon polyps Sister    Colon cancer Neg Hx      Social History Ms. Keville reports that she quit smoking about 36 years ago. Her smoking use included cigarettes. She has a 2.50 pack-year smoking history. She has never used smokeless tobacco. Ms. Hertzberg reports no history of alcohol use.   Review of Systems CONSTITUTIONAL: No weight loss, fever, chills, weakness or fatigue.  HEENT: Eyes: No visual loss, blurred vision, double vision or yellow sclerae.No hearing loss, sneezing,  congestion, runny nose or sore throat.  SKIN: No rash or itching.  CARDIOVASCULAR: per hpi RESPIRATORY: No shortness of breath, cough or sputum.  GASTROINTESTINAL: No anorexia, nausea, vomiting or diarrhea. No abdominal pain or blood.  GENITOURINARY: No burning on urination, no polyuria NEUROLOGICAL: No headache, dizziness, syncope, paralysis, ataxia, numbness or tingling in the extremities. No change in bowel or bladder control.  MUSCULOSKELETAL: No muscle, back pain, joint pain or stiffness.  LYMPHATICS: No enlarged nodes. No history of  splenectomy.  PSYCHIATRIC: No history of depression or anxiety.  ENDOCRINOLOGIC: No reports of sweating, cold or heat intolerance. No polyuria or polydipsia.  Marland Kitchen   Physical Examination Today's Vitals   12/18/22 1502  BP: (!) 144/86  Pulse: 87  SpO2: (!) 88%   There is no height or weight on file to calculate BMI.  Gen: resting comfortably, no acute distress HEENT: no scleral icterus, pupils equal round and reactive, no palptable cervical adenopathy,  CV: irreg, 2/6 systolic murmur apex. +JVD Resp: bilateral crackles GI: abdomen is soft, non-tender, non-distended, normal bowel sounds, no hepatosplenomegaly MSK: extremities are warm, no edema.  Skin: warm, no rash Neuro:  no focal deficits Psych: appropriate affect   Diagnostic Studies 12/2020 echo IMPRESSIONS     1. Left ventricular ejection fraction, by estimation, is 60 to 65%. The  left ventricle has normal function. The left ventricle has no regional  wall motion abnormalities. Left ventricular diastolic parameters are  indeterminate.   2. RV-RA gradient 36 mmHg suggesting at least mildly increased RVSP.  Right ventricular systolic function is normal. The right ventricular size  is normal.   3. Left atrial size was mildly dilated.   4. The mitral valve appears rheumatic, mildly thickened with restricted  leaflet motion. Moderate mitral valve regurgitation. Moderate mitral  stenosis.  The mean mitral valve gradient is 5.5 mmHg. Valve area 1.25 cm2  by planimetry.   5. The aortic valve is tricuspid. There is mild calcification of the  aortic valve. Aortic valve regurgitation is not visualized.   6. Unable to estimate CVP.           Assessment and Plan  1.AFib - off anticoag due to recurrent issues with anemia. - recent palpitaitions, stop metoprolol and start bisoprolol 5mg  daily.    2. Acute on chronic diastolic HF - fluid overloaded. Take lasix 40mg  daily x 4 days, then back to prn dosing.    3. Chest pain - atypical symptoms, not a candidate for ischemic testing given advanced lung disease and renal disease - follow symptoms with management of her fluid status, chronic lung disease      Antoine Poche, M.D.

## 2023-01-02 DIAGNOSIS — J449 Chronic obstructive pulmonary disease, unspecified: Secondary | ICD-10-CM | POA: Diagnosis not present

## 2023-01-13 ENCOUNTER — Encounter: Payer: Self-pay | Admitting: Nurse Practitioner

## 2023-01-13 ENCOUNTER — Encounter (HOSPITAL_COMMUNITY): Payer: Self-pay | Admitting: *Deleted

## 2023-01-13 ENCOUNTER — Ambulatory Visit: Payer: Medicare PPO | Attending: Nurse Practitioner | Admitting: Nurse Practitioner

## 2023-01-13 ENCOUNTER — Inpatient Hospital Stay (HOSPITAL_COMMUNITY)
Admission: EM | Admit: 2023-01-13 | Discharge: 2023-01-16 | DRG: 291 | Disposition: A | Discharge status: Home Health | Payer: Medicare PPO | Attending: Family Medicine | Admitting: Family Medicine

## 2023-01-13 ENCOUNTER — Emergency Department (HOSPITAL_COMMUNITY): Payer: Medicare PPO

## 2023-01-13 ENCOUNTER — Other Ambulatory Visit: Payer: Self-pay

## 2023-01-13 VITALS — BP 140/80 | HR 72 | Wt 163.0 lb

## 2023-01-13 DIAGNOSIS — J849 Interstitial pulmonary disease, unspecified: Secondary | ICD-10-CM | POA: Diagnosis not present

## 2023-01-13 DIAGNOSIS — I4891 Unspecified atrial fibrillation: Secondary | ICD-10-CM

## 2023-01-13 DIAGNOSIS — I517 Cardiomegaly: Secondary | ICD-10-CM | POA: Diagnosis not present

## 2023-01-13 DIAGNOSIS — I130 Hypertensive heart and chronic kidney disease with heart failure and stage 1 through stage 4 chronic kidney disease, or unspecified chronic kidney disease: Secondary | ICD-10-CM | POA: Diagnosis not present

## 2023-01-13 DIAGNOSIS — I480 Paroxysmal atrial fibrillation: Secondary | ICD-10-CM

## 2023-01-13 DIAGNOSIS — Z9981 Dependence on supplemental oxygen: Secondary | ICD-10-CM | POA: Diagnosis not present

## 2023-01-13 DIAGNOSIS — R079 Chest pain, unspecified: Secondary | ICD-10-CM | POA: Diagnosis not present

## 2023-01-13 DIAGNOSIS — I10 Essential (primary) hypertension: Secondary | ICD-10-CM

## 2023-01-13 DIAGNOSIS — K219 Gastro-esophageal reflux disease without esophagitis: Secondary | ICD-10-CM | POA: Diagnosis present

## 2023-01-13 DIAGNOSIS — N2581 Secondary hyperparathyroidism of renal origin: Secondary | ICD-10-CM | POA: Diagnosis present

## 2023-01-13 DIAGNOSIS — Z7982 Long term (current) use of aspirin: Secondary | ICD-10-CM | POA: Diagnosis not present

## 2023-01-13 DIAGNOSIS — J441 Chronic obstructive pulmonary disease with (acute) exacerbation: Secondary | ICD-10-CM | POA: Diagnosis not present

## 2023-01-13 DIAGNOSIS — N1832 Chronic kidney disease, stage 3b: Secondary | ICD-10-CM | POA: Diagnosis not present

## 2023-01-13 DIAGNOSIS — Z880 Allergy status to penicillin: Secondary | ICD-10-CM | POA: Diagnosis not present

## 2023-01-13 DIAGNOSIS — R0602 Shortness of breath: Secondary | ICD-10-CM

## 2023-01-13 DIAGNOSIS — I050 Rheumatic mitral stenosis: Secondary | ICD-10-CM | POA: Diagnosis present

## 2023-01-13 DIAGNOSIS — Z8051 Family history of malignant neoplasm of kidney: Secondary | ICD-10-CM

## 2023-01-13 DIAGNOSIS — E871 Hypo-osmolality and hyponatremia: Secondary | ICD-10-CM | POA: Diagnosis not present

## 2023-01-13 DIAGNOSIS — D631 Anemia in chronic kidney disease: Secondary | ICD-10-CM | POA: Diagnosis present

## 2023-01-13 DIAGNOSIS — J309 Allergic rhinitis, unspecified: Secondary | ICD-10-CM | POA: Diagnosis present

## 2023-01-13 DIAGNOSIS — N183 Chronic kidney disease, stage 3 unspecified: Secondary | ICD-10-CM | POA: Diagnosis not present

## 2023-01-13 DIAGNOSIS — J9621 Acute and chronic respiratory failure with hypoxia: Secondary | ICD-10-CM | POA: Diagnosis present

## 2023-01-13 DIAGNOSIS — R778 Other specified abnormalities of plasma proteins: Secondary | ICD-10-CM | POA: Diagnosis present

## 2023-01-13 DIAGNOSIS — Z87891 Personal history of nicotine dependence: Secondary | ICD-10-CM | POA: Diagnosis not present

## 2023-01-13 DIAGNOSIS — D472 Monoclonal gammopathy: Secondary | ICD-10-CM | POA: Diagnosis present

## 2023-01-13 DIAGNOSIS — I472 Ventricular tachycardia, unspecified: Secondary | ICD-10-CM | POA: Diagnosis present

## 2023-01-13 DIAGNOSIS — Z83719 Family history of colon polyps, unspecified: Secondary | ICD-10-CM | POA: Diagnosis not present

## 2023-01-13 DIAGNOSIS — I5043 Acute on chronic combined systolic (congestive) and diastolic (congestive) heart failure: Secondary | ICD-10-CM | POA: Diagnosis not present

## 2023-01-13 DIAGNOSIS — J929 Pleural plaque without asbestos: Secondary | ICD-10-CM | POA: Diagnosis not present

## 2023-01-13 DIAGNOSIS — J9601 Acute respiratory failure with hypoxia: Secondary | ICD-10-CM | POA: Diagnosis not present

## 2023-01-13 DIAGNOSIS — I5032 Chronic diastolic (congestive) heart failure: Secondary | ICD-10-CM

## 2023-01-13 DIAGNOSIS — R739 Hyperglycemia, unspecified: Secondary | ICD-10-CM | POA: Diagnosis present

## 2023-01-13 DIAGNOSIS — Z79899 Other long term (current) drug therapy: Secondary | ICD-10-CM | POA: Diagnosis not present

## 2023-01-13 DIAGNOSIS — I4819 Other persistent atrial fibrillation: Secondary | ICD-10-CM | POA: Diagnosis not present

## 2023-01-13 DIAGNOSIS — J9611 Chronic respiratory failure with hypoxia: Secondary | ICD-10-CM

## 2023-01-13 DIAGNOSIS — I088 Other rheumatic multiple valve diseases: Secondary | ICD-10-CM | POA: Diagnosis not present

## 2023-01-13 DIAGNOSIS — J449 Chronic obstructive pulmonary disease, unspecified: Secondary | ICD-10-CM | POA: Diagnosis present

## 2023-01-13 DIAGNOSIS — D649 Anemia, unspecified: Secondary | ICD-10-CM | POA: Diagnosis present

## 2023-01-13 DIAGNOSIS — I5033 Acute on chronic diastolic (congestive) heart failure: Secondary | ICD-10-CM

## 2023-01-13 DIAGNOSIS — I5031 Acute diastolic (congestive) heart failure: Secondary | ICD-10-CM | POA: Diagnosis not present

## 2023-01-13 DIAGNOSIS — I509 Heart failure, unspecified: Secondary | ICD-10-CM

## 2023-01-13 DIAGNOSIS — I110 Hypertensive heart disease with heart failure: Secondary | ICD-10-CM | POA: Diagnosis not present

## 2023-01-13 DIAGNOSIS — R918 Other nonspecific abnormal finding of lung field: Secondary | ICD-10-CM | POA: Diagnosis not present

## 2023-01-13 LAB — BASIC METABOLIC PANEL
Anion gap: 10 (ref 5–15)
BUN: 18 mg/dL (ref 8–23)
CO2: 29 mmol/L (ref 22–32)
Calcium: 9.4 mg/dL (ref 8.9–10.3)
Chloride: 97 mmol/L — ABNORMAL LOW (ref 98–111)
Creatinine, Ser: 1.46 mg/dL — ABNORMAL HIGH (ref 0.44–1.00)
GFR, Estimated: 34 mL/min — ABNORMAL LOW (ref >60)
Glucose, Bld: 92 mg/dL (ref 70–99)
Potassium: 4.6 mmol/L (ref 3.5–5.1)
Sodium: 136 mmol/L (ref 135–145)

## 2023-01-13 LAB — CBC WITH DIFFERENTIAL/PLATELET
Abs Immature Granulocytes: 0.12 10*3/uL — ABNORMAL HIGH (ref 0.00–0.07)
Basophils Absolute: 0.1 10*3/uL (ref 0.0–0.1)
Basophils Relative: 1 %
Eosinophils Absolute: 1 10*3/uL — ABNORMAL HIGH (ref 0.0–0.5)
Eosinophils Relative: 11 %
HCT: 36.8 % (ref 36.0–46.0)
Hemoglobin: 11.5 g/dL — ABNORMAL LOW (ref 12.0–15.0)
Immature Granulocytes: 1 %
Lymphocytes Relative: 18 %
Lymphs Abs: 1.7 10*3/uL (ref 0.7–4.0)
MCH: 29.4 pg (ref 26.0–34.0)
MCHC: 31.3 g/dL (ref 30.0–36.0)
MCV: 94.1 fL (ref 80.0–100.0)
Monocytes Absolute: 0.6 10*3/uL (ref 0.1–1.0)
Monocytes Relative: 7 %
Neutro Abs: 5.6 10*3/uL (ref 1.7–7.7)
Neutrophils Relative %: 62 %
Platelets: 244 10*3/uL (ref 150–400)
RBC: 3.91 MIL/uL (ref 3.87–5.11)
RDW: 14 % (ref 11.5–15.5)
WBC: 9.1 10*3/uL (ref 4.0–10.5)
nRBC: 0 % (ref 0.0–0.2)

## 2023-01-13 LAB — TROPONIN I (HIGH SENSITIVITY)
Troponin I (High Sensitivity): 8 ng/L (ref <18)
Troponin I (High Sensitivity): 8 ng/L (ref <18)
Troponin I (High Sensitivity): 8 ng/L (ref <18)

## 2023-01-13 LAB — BRAIN NATRIURETIC PEPTIDE: B Natriuretic Peptide: 659 pg/mL — ABNORMAL HIGH (ref 0.0–100.0)

## 2023-01-13 LAB — D-DIMER, QUANTITATIVE: D-Dimer, Quant: 0.59 ug/mL-FEU — ABNORMAL HIGH (ref 0.00–0.50)

## 2023-01-13 MED ORDER — ACETAMINOPHEN 325 MG PO TABS: 650.0000 mg | ORAL_TABLET | Freq: Four times a day (QID) | ORAL | Status: DC | PRN

## 2023-01-13 MED ORDER — IPRATROPIUM-ALBUTEROL 0.5-2.5 (3) MG/3ML IN SOLN
3.0000 mL | RESPIRATORY_TRACT | Status: AC
  Administered 2023-01-13 (×3): 3 mL via RESPIRATORY_TRACT
  Filled 2023-01-13 (×2): qty 6
  Filled 2023-01-13: qty 3

## 2023-01-13 MED ORDER — HYDRALAZINE HCL 20 MG/ML IJ SOLN: 10.0000 mg | Freq: Four times a day (QID) | INTRAMUSCULAR | Status: DC | PRN

## 2023-01-13 MED ORDER — DILTIAZEM HCL ER COATED BEADS 180 MG PO CP24
300.0000 mg | ORAL_CAPSULE | Freq: Every day | ORAL | Status: DC
  Administered 2023-01-14 – 2023-01-16 (×3): 300 mg via ORAL
  Filled 2023-01-13 (×3): qty 1

## 2023-01-13 MED ORDER — SODIUM CHLORIDE 0.9 % IV SOLN
500.0000 mg | INTRAVENOUS | Status: DC
  Administered 2023-01-13: 500 mg via INTRAVENOUS
  Filled 2023-01-13: qty 5

## 2023-01-13 MED ORDER — NITROGLYCERIN 2 % TD OINT
0.5000 [in_us] | TOPICAL_OINTMENT | Freq: Three times a day (TID) | TRANSDERMAL | Status: DC
  Administered 2023-01-14 – 2023-01-16 (×7): 0.5 [in_us] via TOPICAL
  Filled 2023-01-13 (×8): qty 1

## 2023-01-13 MED ORDER — ONDANSETRON HCL 4 MG PO TABS: 4.0000 mg | ORAL_TABLET | Freq: Four times a day (QID) | ORAL | Status: DC | PRN

## 2023-01-13 MED ORDER — PANTOPRAZOLE SODIUM 40 MG PO TBEC
40.0000 mg | DELAYED_RELEASE_TABLET | Freq: Every day | ORAL | Status: DC
  Administered 2023-01-14 – 2023-01-16 (×3): 40 mg via ORAL
  Filled 2023-01-13 (×3): qty 1

## 2023-01-13 MED ORDER — ASPIRIN 81 MG PO TBEC
81.0000 mg | DELAYED_RELEASE_TABLET | Freq: Every day | ORAL | Status: DC
  Administered 2023-01-14 – 2023-01-16 (×3): 81 mg via ORAL
  Filled 2023-01-13 (×3): qty 1

## 2023-01-13 MED ORDER — POTASSIUM CHLORIDE CRYS ER 20 MEQ PO TBCR
20.0000 meq | EXTENDED_RELEASE_TABLET | Freq: Every day | ORAL | Status: DC
  Administered 2023-01-14: 20 meq via ORAL
  Filled 2023-01-13: qty 1

## 2023-01-13 MED ORDER — UMECLIDINIUM BROMIDE 62.5 MCG/ACT IN AEPB
1.0000 | INHALATION_SPRAY | Freq: Every day | RESPIRATORY_TRACT | Status: DC
  Administered 2023-01-14 – 2023-01-16 (×3): 1 via RESPIRATORY_TRACT
  Filled 2023-01-13: qty 7

## 2023-01-13 MED ORDER — METHYLPREDNISOLONE SODIUM SUCC 40 MG IJ SOLR
40.0000 mg | Freq: Two times a day (BID) | INTRAMUSCULAR | Status: AC
  Administered 2023-01-14 (×2): 40 mg via INTRAVENOUS
  Filled 2023-01-13 (×2): qty 1

## 2023-01-13 MED ORDER — GABAPENTIN 400 MG PO CAPS
400.0000 mg | ORAL_CAPSULE | Freq: Three times a day (TID) | ORAL | Status: DC
  Administered 2023-01-13 – 2023-01-15 (×5): 400 mg via ORAL
  Filled 2023-01-13 (×5): qty 1

## 2023-01-13 MED ORDER — METHYLPREDNISOLONE SODIUM SUCC 125 MG IJ SOLR
125.0000 mg | Freq: Once | INTRAMUSCULAR | Status: AC
  Administered 2023-01-13: 125 mg via INTRAVENOUS
  Filled 2023-01-13: qty 2

## 2023-01-13 MED ORDER — ENOXAPARIN SODIUM 30 MG/0.3ML IJ SOSY
30.0000 mg | PREFILLED_SYRINGE | INTRAMUSCULAR | Status: DC
  Administered 2023-01-13 – 2023-01-15 (×3): 30 mg via SUBCUTANEOUS
  Filled 2023-01-13 (×3): qty 0.3

## 2023-01-13 MED ORDER — ACETAMINOPHEN 650 MG RE SUPP: 650.0000 mg | Freq: Four times a day (QID) | RECTAL | Status: DC | PRN

## 2023-01-13 MED ORDER — FUROSEMIDE 10 MG/ML IJ SOLN: 40.0000 mg | Freq: Every day | INTRAMUSCULAR | Status: DC

## 2023-01-13 MED ORDER — BISOPROLOL FUMARATE 5 MG PO TABS
5.0000 mg | ORAL_TABLET | Freq: Every day | ORAL | Status: DC
  Administered 2023-01-14 – 2023-01-16 (×3): 5 mg via ORAL
  Filled 2023-01-13 (×3): qty 1

## 2023-01-13 MED ORDER — BENZONATATE 100 MG PO CAPS: 100.0000 mg | ORAL_CAPSULE | Freq: Three times a day (TID) | ORAL | Status: DC | PRN

## 2023-01-13 MED ORDER — FUROSEMIDE 10 MG/ML IJ SOLN
20.0000 mg | Freq: Once | INTRAMUSCULAR | Status: AC
  Administered 2023-01-13: 20 mg via INTRAVENOUS
  Filled 2023-01-13: qty 2

## 2023-01-13 MED ORDER — ALBUTEROL SULFATE (2.5 MG/3ML) 0.083% IN NEBU
2.5000 mg | INHALATION_SOLUTION | Freq: Three times a day (TID) | RESPIRATORY_TRACT | Status: DC
  Administered 2023-01-14 – 2023-01-16 (×7): 2.5 mg via RESPIRATORY_TRACT
  Filled 2023-01-13 (×7): qty 3

## 2023-01-13 MED ORDER — MONTELUKAST SODIUM 10 MG PO TABS
10.0000 mg | ORAL_TABLET | Freq: Every day | ORAL | Status: DC
  Administered 2023-01-13 – 2023-01-15 (×3): 10 mg via ORAL
  Filled 2023-01-13 (×3): qty 1

## 2023-01-13 MED ORDER — ALBUTEROL SULFATE (2.5 MG/3ML) 0.083% IN NEBU: 2.5000 mg | INHALATION_SOLUTION | Freq: Four times a day (QID) | RESPIRATORY_TRACT | Status: DC | PRN

## 2023-01-13 MED ORDER — ONDANSETRON HCL 4 MG/2ML IJ SOLN: 4.0000 mg | Freq: Four times a day (QID) | INTRAMUSCULAR | Status: DC | PRN

## 2023-01-13 MED ORDER — FUROSEMIDE 10 MG/ML IJ SOLN
40.0000 mg | Freq: Once | INTRAMUSCULAR | Status: AC
  Administered 2023-01-13: 40 mg via INTRAVENOUS
  Filled 2023-01-13: qty 4

## 2023-01-13 NOTE — Progress Notes (Addendum)
Cardiology Office Note:  .   Date:  01/13/2023  ID:  Amber Hull, DOB 1932/06/23, MRN [id number] PCP: Donetta Potts, MD  [facility] Providers Cardiologist:  Dina Rich, MD    History of Present Illness: .   Amber Hull is a 89 y.o. female with a PMH of chest pain, A-fib, chronic diastolic CHF, chronic respiratory failure (on chronic O2), HTN, moderate mitral valve stenosis, CKD and chronic anemia (followed by Dr. Wolfgang Phoenix), who presents today for 1 month follow-up.   Last seen by Dr. Dina Rich on December 18, 2022. Pt noted some recent palpitations, and showed signs of fluid overload. She also reported some atypical symptoms of chest pain. Metoprolol was switched to bisoprolol at 5 mg daily and was instructed to take Lasix 40 mg daily x 4 days, then return back to PRN dosing.   Today she presents for 1 month follow-up with her friend, Amber Hull. Pt is a poor historian due to [facility]. Admits to chest pain and shortness of breath. Unable to tell if med changes from last OV are helping. Denies any palpitations, syncope, presyncope, dizziness, orthopnea, PND, swelling or significant weight changes, acute bleeding, or claudication. Friend states she encouraged patient to go to ED last Saturday, however pt declined.   Studies Reviewed: Marland Kitchen    EKG today reveals A-fib, 72 bpm.   Echo 12/2020:  1. Left ventricular ejection fraction, by estimation, is 60 to 65%. The  left ventricle has normal function. The left ventricle has no regional  wall motion abnormalities. Left ventricular diastolic parameters are  indeterminate.   2. RV-RA gradient 36 mmHg suggesting at least mildly increased RVSP.  Right ventricular systolic function is normal. The right ventricular size  is normal.   3. Left atrial size was mildly dilated.   4. The mitral valve appears rheumatic, mildly thickened with restricted  leaflet motion. Moderate mitral valve regurgitation. Moderate mitral  stenosis.  The mean mitral valve gradient is 5.5 mmHg. Valve area 1.25 cm2  by planimetry.   5. The aortic valve is tricuspid. There is mild calcification of the  aortic valve. Aortic valve regurgitation is not visualized.   6. Unable to estimate CVP.   Comparison(s): Echocardiogram done 03/02/17 showed an EF of 55-60% with  mild to moderate mital stenosis.  Risk Assessment/Calculations:    CHA2DS2-VASc Score = 5  } This indicates a 7.2% annual risk of stroke. The patient's score is based upon: CHF History: 1 HTN History: 1 Diabetes History: 0 Stroke History: 0 Vascular Disease History: 0 Age Score: 2 Gender Score: 1  Physical Exam:   VS:  BP (!) 140/80   Pulse 72   Wt 163 lb (73.9 kg)   SpO2 (!) 79%   BMI 24.78 kg/m    Wt Readings from Last 3 Encounters:  01/13/23 163 lb (73.9 kg)  06/11/22 176 lb 6.4 oz (80 kg)  01/21/22 173 lb 12.8 oz (78.8 kg)    GEN: Thin 89 y.o. female in acute distress, grunting/panting, pale NECK: No JVD; No carotid bruits CARDIAC: S1/S2, RRR, no murmurs, rubs, gallops RESPIRATORY:  diminished scattered rhonchi to auscultation with scattered diminished wheezing, tachypnea and dyspnea at rest ABDOMEN: Soft, non-tender, non-distended EXTREMITIES:  No edema; No deformity   ASSESSMENT AND PLAN: .    Shortness of breath Chest pain Chronic diastolic CHF Chronic respiratory failure A-fib HTN Moderate mitral valve stenosis CKD stage 3/chronic anemia  Patient is a 89 year old female with past medical history as mentioned  above.  Presents today for 1 month follow-up.  HPI and PE as mentioned above.  Friend states she has not been doing well, encouraged her to go to the ED last Saturday, however patient declined.  Before I entered patient room, could hear panting/grunting and evidence of respiratory distress.  O2 saturation 79% on oxygen, EKG reveals A-fib, 72 bpm, already on chronic home O2.  Patient notes shortness of breath and chest pain.  Patient is a  difficult historian due to [facility].  PE as mentioned above.  Recommended/strongly advised ED evaluation as patient needs more intensive care/treatment than this office can provide her at this time, daughter was called and agreed with my recommendation.  Patient agreeable to go to [facility] ED, friend Amber Hull will drive her there immediately.  Did offer to call EMS, patient declined and requesting to go to [facility] ED at this time. Gave report to ED physician on call, who verbalized understanding of report.   Upon arrival to the ED, recommend the following be done/obtained: 1.  Twelve-lead EKG and close monitoring of vital signs, administer oxygen/consult respiratory therapy/may need BiPAP to help with respiratory distress as well as Nebs (do not have BiPAP/Nebs in this office), therefore sent to ED for further evaluation.  2.  Labs including the following: CBC with differential, CMET, magnesium, proBNP, lactic acid, trops, respiratory viral panel, and d-dimer. 3.  2 view chest x-ray and CT scan to rule out PE/aortic dissection 4.  Update echocardiogram to evaluate EF and mitral valve stenosis 5.  Admit for further observation, consult cardiology if showing signs/symptoms of acute on chronic diastolic CHF, administer IV diuresis if needed 6.  Consult PT/OT 7.  Discharge when in stable condition and follow-up with outpatient cardiology 1 to 2 weeks after discharge.   Signed, Sharlene Dory, NP

## 2023-01-13 NOTE — ED Provider Notes (Signed)
[place] EMERGENCY DEPARTMENT AT [facility] Provider Note   CSN: [id number] Arrival date & time: 01/13/23  1634     History {Add pertinent medical, surgical, social history, OB history to HPI:1} Chief Complaint  Patient presents with   Shortness of Breath    Amber Hull is a 89 y.o. female with COPD/asthma, chronic respiratory failure with hypoxia on 2 L nasal cannula at home, chronic combined systolic and diastolic heart failure, rheumatic mitral stenosis,, A-fib, HTN, IDA, pulmonary fibrosis, CKD stage III GERD presents with SOB. Patient was sent from cardiology clinic after presenting in resp distress.  Presents with her daughter who provides additional history.  Patient has been feeling short of breath since Saturday.  It has gotten worse and not better.  She also endorses some swelling in her bilateral lower extremities that have been present intermittently.  She denies any chest pain currently.  Denies any nausea vomiting or fevers chills, denies increased cough or hemoptysis.  Patient was satting in the high 80s on 2 L nasal cannula now satting 97% on 3 L.  Takes intermittent Lasix per chart review.  Shortness of Breath      Home Medications Prior to Admission medications   Medication Sig Start Date End Date Taking? Authorizing Provider  albuterol (PROVENTIL HFA;VENTOLIN HFA) 108 (90 BASE) MCG/ACT inhaler Inhale into the lungs every 6 (six) hours as needed for wheezing or shortness of breath.    [provider]  albuterol (PROVENTIL) (2.5 MG/3ML) 0.083% nebulizer solution Take 2.5 mg by nebulization every 6 (six) hours as needed for wheezing or shortness of breath.    [provider]  aspirin EC 81 MG tablet Take 81 mg by mouth daily. Swallow whole.    [provider]  bisoprolol (ZEBETA) 5 MG tablet Take 1 tablet (5 mg total) by mouth daily. 12/18/22   Antoine Poche, MD  calcitRIOL (ROCALTROL) 0.25 MCG capsule Take 0.25 mcg by  mouth 3 (three) times a week. 10/17/20   [provider]  calcium carbonate (OS-CAL) 600 MG TABS tablet Take 600 mg by mouth. Doesn't take everyday    [provider]  cholecalciferol (VITAMIN D) 1000 UNITS tablet Take 1,000 Units by mouth daily.    [provider]  diltiazem (CARDIZEM CD) 300 MG 24 hr capsule Take 1 capsule by mouth once daily 12/01/22   Antoine Poche, MD  esomeprazole (NEXIUM) 40 MG capsule 1 PO 30 MINS PRIOR TO FIRST MEAL 08/04/17   Fields, Darleene Cleaver, MD  furosemide (LASIX) 40 MG tablet Take 1 tablet (40 mg total) by mouth daily as needed (for leg swelling or worsening shortness of breath). 12/18/22   Antoine Poche, MD  gabapentin (NEURONTIN) 400 MG capsule Take 400 mg by mouth 3 (three) times daily.    [provider]  hydrOXYzine (ATARAX/VISTARIL) 25 MG tablet Take 25 mg by mouth at bedtime.    [provider]  MAGNESIUM PO Take 1 tablet by mouth daily.    [provider]  montelukast (SINGULAIR) 10 MG tablet Take 10 mg by mouth at bedtime.    [provider]  potassium chloride SA (KLOR-CON) 20 MEQ tablet Take 1 tablet (20 mEq total) by mouth daily. 10/17/20   Triplett, Tammy, PA-C  vitamin A 3 MG (10000 UNITS) capsule Take by mouth.    [provider]  vitamin B-12 (CYANOCOBALAMIN) 1000 MCG tablet Take 1,000 mcg by mouth daily.    [provider]  vitamin  C (ASCORBIC ACID) 500 MG tablet Take 500 mg by mouth daily.    [provider]      Allergies    Penicillins    Review of Systems   Review of Systems  Respiratory:  Positive for shortness of breath.    A 10 point review of systems was performed and is negative unless otherwise reported in HPI.  Physical Exam Updated Vital Signs BP (!) 174/79   Pulse 78   Temp 98 F (36.7 C)   Resp 19   SpO2 94%  Physical Exam General: Normal appearing elderly female, lying in bed. HOH. HEENT: Sclera anicteric, MMM, trachea midline.   Cardiology: RRR, no murmurs/rubs/gallops. BL radial and DP pulses equal bilaterally.  Resp: Mildly increased resp effort. Inspiratory/expiratory wheezing bilaterally w/ crackles.  Abd: Soft, non-tender, non-distended. No rebound tenderness or guarding.  GU: Deferred. MSK: Mild nonpitting peripheral edema in bilateral LEs up to knees. No signs of trauma. Extremities without deformity or TTP. No cyanosis or clubbing. Skin: warm, dry Neuro: A&Ox4, CNs II-XII grossly intact. MAEs. Sensation grossly intact.  Psych: Normal mood and affect.   ED Results / Procedures / Treatments   Labs (all labs ordered are listed, but only abnormal results are displayed) Labs Reviewed  CBC WITH DIFFERENTIAL/PLATELET  BASIC METABOLIC PANEL  BRAIN NATRIURETIC PEPTIDE  TROPONIN I (HIGH SENSITIVITY)    EKG None  Radiology DG Chest Portable 1 View  Result Date: 01/13/2023 CLINICAL DATA:  Chest pain and shortness of breath EXAM: PORTABLE CHEST 1 VIEW COMPARISON:  10/17/2020 FINDINGS: Once again there is diffuse interstitial changes of the lungs with tiny effusions or pleural thickening. Enlarged cardiopericardial silhouette with a calcified aorta. No pneumothorax. Overlapping cardiac leads. Significant calcifications along the tracheobronchial tree. IMPRESSION: Persistent extensive interstitial changes with pleural thickening or tiny effusions. Enlarged heart. Please correlate for any known history Electronically Signed   By: Karen Kays M.D.   On: 01/13/2023 17:13    Procedures Procedures  {Document cardiac monitor, telemetry assessment procedure when appropriate:1}  Medications Ordered in ED Medications  ipratropium-albuterol (DUONEB) 0.5-2.5 (3) MG/3ML nebulizer solution 3 mL (3 mLs Nebulization Given 01/13/23 1748)  methylPREDNISolone sodium succinate (SOLU-MEDROL) 125 mg/2 mL injection 125 mg (125 mg Intravenous Given 01/13/23 1750)  furosemide (LASIX) injection 40 mg (40 mg Intravenous Given 01/13/23 1748)     ED Course/ Medical Decision Making/ A&P                          Medical Decision Making Amount and/or Complexity of Data Reviewed Labs: ordered. Radiology: ordered.  Risk Prescription drug management.    This patient presents to the ED for concern of SOB/dyspnea, this involves an extensive number of treatment options, and is a complaint that carries with it a high risk of complications and morbidity.  I considered the following differential and admission for this acute, potentially life threatening condition.   MDM:    DDX for dyspnea includes but is not limited to:  With patient's history of asthma COPD in setting of wheezing likely COPD exacerbation and will give Solu-Medrol and DuoNebs.  She did not receive any DuoNebs or any treatment from the cardiologist.  She came here by POV and not EMS.  She denies any chest pain at this time but apparently at the cardiologist had reported some chest pain so we will get troponin to rule out ACS.  She additionally has crackles and mild lower extremity edema, cardiologist was  concerned about a CHF exacerbation, will get BNP.  Chest x-ray demonstrates enlarged heart and vascular congestion.  Will treat with IV Lasix here as well.  EKG without any signs of arrhythmia.  No fevers or chills indicate pneumonia.  No asymmetric swelling in her lower extremities, do not believe PE is highest on the differential at this time.     Labs: I Ordered, and personally interpreted labs.  The pertinent results include: Those listed above  Imaging Studies ordered: chest x-ray ordered from triage I independently visualized and interpreted imaging. I agree with the radiologist interpretation  Additional history obtained from ***.  External records from outside source obtained and reviewed including ***  Cardiac Monitoring: The patient was maintained on a cardiac monitor.  I personally viewed and interpreted the cardiac monitored which showed an underlying  rhythm of: ***  Reevaluation: After the interventions noted above, I reevaluated the patient and found that they have :{resolved/improved/worsened:23923::"improved"}  Social Determinants of Health: ***  Disposition:  ***  Co morbidities that complicate the patient evaluation  Past Medical History:  Diagnosis Date   Anemia    Arthritis    Asthma    Atrial fibrillation (HCC)    CHF (congestive heart failure) (HCC)    COPD (chronic obstructive pulmonary disease) (HCC)    History of nuclear stress test 2017   intermediate risk   History of shingles    HOH (hard of hearing)    Mitral stenosis    Neuralgia    On home O2    3L N/C     Medicines Meds ordered this encounter  Medications   methylPREDNISolone sodium succinate (SOLU-MEDROL) 125 mg/2 mL injection 125 mg   ipratropium-albuterol (DUONEB) 0.5-2.5 (3) MG/3ML nebulizer solution 3 mL   furosemide (LASIX) injection 40 mg    I have reviewed the patients home medicines and have made adjustments as needed  Problem List / ED Course: Problem List Items Addressed This Visit   None        {Document critical care time when appropriate:1} {Document review of labs and clinical decision tools ie heart score, Chads2Vasc2 etc:1}  {Document your independent review of radiology images, and any outside records:1} {Document your discussion with family members, caretakers, and with consultants:1} {Document social determinants of health affecting pt's care:1} {Document your decision making why or why not admission, treatments were needed:1}  This note was created using dictation software, which may contain spelling or grammatical errors.

## 2023-01-13 NOTE — Patient Instructions (Addendum)
Medication Instructions:  Your physician recommends that you continue on your current medications as directed. Please refer to the Current Medication list given to you today.  Labwork: none  Testing/Procedures: none  Follow-Up: Your physician recommends that you schedule a follow-up appointment in: sent to [place] ED  Any Other Special Instructions Will Be Listed Below (If Applicable).  If you need a refill on your cardiac medications before your next appointment, please call your pharmacy. 

## 2023-01-13 NOTE — ED Triage Notes (Signed)
Pt with SOB since Saturday night.  Also with CP. Pt sent from her cardiologist.

## 2023-01-13 NOTE — H&P (Addendum)
History and Physical    Patient: Amber Hull:[id number] DOB: Oct 20, 1931 DOA: 01/13/2023 DOS: the patient was seen and examined on 01/13/2023 PCP: Donetta Potts, MD  Patient coming from: Home  Chief Complaint:  Chief Complaint  Patient presents with   Shortness of Breath   HPI: Amber Hull is a 89 y.o. female with medical history significant of Hypertension, Pafib, Chronic diastolic CHF (EF 81-19%), mod MR mod MS, CKD stage 3a , Anemia, Copd on home o2 4-5L, presents with increase in dyspnea today. + cough w clear sputum.  + orthopnea/ pnd.  + edema.  Pt notes slight left side cp, "throbbing" without radiation lasting minutes at about 5:30pm while at cardiology office.  Pt was sent from cardiology to ER for evaluation of hypoxia.   Pt denies n/v, abd pain, diarrhea, brbpr, black stool, dysuria, hematuria.  Pt denies medication noncompliance, remarks that she was recently started on zebeta.  Pt denies eating out or eating salty food.  Pt will be admitted for acute respiratory failure secondary to Acute CHF.    Review of Systems: As mentioned in the history of present illness. All other systems reviewed and are negative. Past Medical History:  Diagnosis Date   Anemia    Arthritis    Asthma    Atrial fibrillation (HCC)    CHF (congestive heart failure) (HCC)    COPD (chronic obstructive pulmonary disease) (HCC)    History of nuclear stress test 2017   intermediate risk   History of shingles    HOH (hard of hearing)    Mitral stenosis    Neuralgia    On home O2    3L N/C   Past Surgical History:  Procedure Laterality Date   ABDOMINAL HYSTERECTOMY     BIOPSY  08/04/2017   Procedure: BIOPSY;  Surgeon: West Bali, MD;  Location: [facility];  Service: Endoscopy;;  duodenal gastric   CHOLECYSTECTOMY     COLONOSCOPY WITH PROPOFOL N/A 08/04/2017   Procedure: COLONOSCOPY WITH PROPOFOL;  Surgeon: West Bali, MD;  Location: [facility];  Service:  Endoscopy;  Laterality: N/A;  8:30am   ESOPHAGOGASTRODUODENOSCOPY (EGD) WITH PROPOFOL N/A 08/04/2017   Procedure: ESOPHAGOGASTRODUODENOSCOPY (EGD) WITH PROPOFOL;  Surgeon: West Bali, MD;  Location: [facility];  Service: Endoscopy;  Laterality: N/A;   FASCIECTOMY Right 10/17/2014   Procedure: FASCIECTOMY RIGHT RING/SMALL FINGERS;  Surgeon: Cindee Salt, MD;  Location: [facility];  Service: Orthopedics;  Laterality: Right;   KNEE ARTHROSCOPY     left   POLYPECTOMY  08/04/2017   Procedure: POLYPECTOMY;  Surgeon: West Bali, MD;  Location: [facility];  Service: Endoscopy;;  colon   TUBAL LIGATION     TYMPANOSTOMY TUBE PLACEMENT     both ears   Social History:  reports that she quit smoking about 36 years ago. Her smoking use included cigarettes. She has a 2.50 pack-year smoking history. She has never used smokeless tobacco. She reports that she does not drink alcohol and does not use drugs.  Allergies  Allergen Reactions   Penicillins Rash    Has patient had a PCN reaction causing immediate rash, facial/tongue/throat swelling, SOB or lightheadedness with hypotension: No Has patient had a PCN reaction causing severe rash involving mucus membranes or skin necrosis: No Has patient had a PCN reaction that required hospitalization: No Has patient had a PCN reaction occurring within the last 10 years: No If all of the above answers are "  NO", then may proceed with Cephalosporin use.     Family History  Problem Relation Age of Onset   Kidney cancer Mother    Colon polyps Sister    Colon cancer Neg Hx     Prior to Admission medications   Medication Sig Start Date End Date Taking? Authorizing Provider  albuterol (PROVENTIL HFA;VENTOLIN HFA) 108 (90 BASE) MCG/ACT inhaler Inhale into the lungs every 6 (six) hours as needed for wheezing or shortness of breath.    [provider]  albuterol (PROVENTIL) (2.5 MG/3ML) 0.083% nebulizer solution Take 2.5 mg by  nebulization every 6 (six) hours as needed for wheezing or shortness of breath.    [provider]  aspirin EC 81 MG tablet Take 81 mg by mouth daily. Swallow whole.    [provider]  bisoprolol (ZEBETA) 5 MG tablet Take 1 tablet (5 mg total) by mouth daily. 12/18/22   Antoine Poche, MD  calcitRIOL (ROCALTROL) 0.25 MCG capsule Take 0.25 mcg by mouth 3 (three) times a week. 10/17/20   [provider]  calcium carbonate (OS-CAL) 600 MG TABS tablet Take 600 mg by mouth. Doesn't take everyday    [provider]  cholecalciferol (VITAMIN D) 1000 UNITS tablet Take 1,000 Units by mouth daily.    [provider]  diltiazem (CARDIZEM CD) 300 MG 24 hr capsule Take 1 capsule by mouth once daily 12/01/22   Antoine Poche, MD  esomeprazole (NEXIUM) 40 MG capsule 1 PO 30 MINS PRIOR TO FIRST MEAL 08/04/17   Fields, Darleene Cleaver, MD  furosemide (LASIX) 40 MG tablet Take 1 tablet (40 mg total) by mouth daily as needed (for leg swelling or worsening shortness of breath). 12/18/22   Antoine Poche, MD  gabapentin (NEURONTIN) 400 MG capsule Take 400 mg by mouth 3 (three) times daily.    [provider]  hydrOXYzine (ATARAX/VISTARIL) 25 MG tablet Take 25 mg by mouth at bedtime.    [provider]  MAGNESIUM PO Take 1 tablet by mouth daily.    [provider]  montelukast (SINGULAIR) 10 MG tablet Take 10 mg by mouth at bedtime.    [provider]  potassium chloride SA (KLOR-CON) 20 MEQ tablet Take 1 tablet (20 mEq total) by mouth daily. 10/17/20   Triplett, Tammy, PA-C  vitamin A 3 MG (10000 UNITS) capsule Take by mouth.    [provider]  vitamin B-12 (CYANOCOBALAMIN) 1000 MCG tablet Take 1,000 mcg by mouth daily.    [provider]  vitamin C (ASCORBIC ACID) 500 MG tablet Take 500 mg by mouth daily.    [provider]    Physical Exam: Vitals:   01/13/23 1900 01/13/23 1906 01/13/23 1945 01/13/23 2000  BP:   (!) 164/91 (!) 166/85 (!) 148/92  Pulse: 81 79 84 85  Resp: (!) 27 (!) 27 (!) 23 (!) 26  Temp:      TempSrc:      SpO2: 96% 96% 95% 94%   Heent: anicteric Neck + jvd Heart: irr, irr, s1, s2, 2/6 dm apex Lung: + crackles 1/3 up bilateral lung fields, trace wheezing Abd: soft, obese, nt, nd, +bs Ext: no c/c/e Skin: no rash Lymph: no adenopathy Neuro: nonfocal  Data Reviewed:  CXR FINDINGS: Once again there is diffuse interstitial changes of the lungs with tiny effusions or pleural thickening. Enlarged cardiopericardial silhouette with a calcified aorta. No pneumothorax. Overlapping cardiac leads. Significant calcifications along the tracheobronchial tree.   IMPRESSION: Persistent  extensive interstitial changes with pleural thickening or tiny effusions. Enlarged heart. Please correlate for any known history  Assessment and Plan:  Acute respiratory failure with hypoxia secondary to Acute CHF (diastolic) and possibly with slight copd Exacerbation contributing Cycle trop Check cardiac echo  Lasix 40mg  iv x 1 in ED, and another Lasix 20mg  iv x 1 in ED Start Lasix 40mg  iv qday Cont Kcl po qday Start Nitropaste 1/2 inch topically tid Cont Zebeta 5mg  po qday Cont Cardizem CD 300mg  po qday  Pafib Cont Zebeta , Cont Cardizem CD as above Cont Aspirin Unclear why not on anticoagulation  Anemia Check cbc in am  Copd w mild exacerbation Solumedrol 40mg  iv bid, can transition to oral prednisone Zithromax 500mg  iv qday Cont singulair 10mg  po qday Start Incruse 1puff qday Start albuterol neb tid  Albuterol neb q2h prn sob Tessalon pearls 100mg  po tid prn cough  Gerd Cont protonix 40mg  po qday  DVT prophylaxis: lovenox [place] FULL CODE Dispo: home   Advance Care Planning:   Code Status: Prior FULL CODE  Consults: none  Family Communication: daughter  Severity of Illness: The appropriate patient status for this patient is INPATIENT. Inpatient status is judged  to be reasonable and necessary in order to provide the required intensity of service to ensure the patient's safety. The patient's presenting symptoms, physical exam findings, and initial radiographic and laboratory data in the context of their chronic comorbidities is felt to place them at high risk for further clinical deterioration. Furthermore, it is not anticipated that the patient will be medically stable for discharge from the hospital within 2 midnights of admission.   * I certify that at the point of admission it is my clinical judgment that the patient will require inpatient hospital care spanning beyond 2 midnights from the point of admission due to high intensity of service, high risk for further deterioration and high frequency of surveillance required.*  Author: Pearson Grippe, MD 01/13/2023 9:43 PM  For on call review [email or web address].

## 2023-01-14 ENCOUNTER — Other Ambulatory Visit (HOSPITAL_COMMUNITY): Payer: Self-pay | Admitting: *Deleted

## 2023-01-14 ENCOUNTER — Encounter (HOSPITAL_COMMUNITY): Payer: Self-pay | Admitting: Internal Medicine

## 2023-01-14 ENCOUNTER — Inpatient Hospital Stay (HOSPITAL_COMMUNITY): Payer: Medicare PPO

## 2023-01-14 DIAGNOSIS — I472 Ventricular tachycardia, unspecified: Secondary | ICD-10-CM

## 2023-01-14 DIAGNOSIS — I4819 Other persistent atrial fibrillation: Secondary | ICD-10-CM

## 2023-01-14 DIAGNOSIS — N183 Chronic kidney disease, stage 3 unspecified: Secondary | ICD-10-CM

## 2023-01-14 DIAGNOSIS — I5031 Acute diastolic (congestive) heart failure: Secondary | ICD-10-CM

## 2023-01-14 DIAGNOSIS — I088 Other rheumatic multiple valve diseases: Secondary | ICD-10-CM

## 2023-01-14 DIAGNOSIS — J9621 Acute and chronic respiratory failure with hypoxia: Secondary | ICD-10-CM

## 2023-01-14 DIAGNOSIS — J9601 Acute respiratory failure with hypoxia: Secondary | ICD-10-CM | POA: Diagnosis not present

## 2023-01-14 LAB — CBC
HCT: 30.6 % — ABNORMAL LOW (ref 36.0–46.0)
Hemoglobin: 9.9 g/dL — ABNORMAL LOW (ref 12.0–15.0)
MCH: 29.3 pg (ref 26.0–34.0)
MCHC: 32.4 g/dL (ref 30.0–36.0)
MCV: 90.5 fL (ref 80.0–100.0)
Platelets: 213 10*3/uL (ref 150–400)
RBC: 3.38 MIL/uL — ABNORMAL LOW (ref 3.87–5.11)
RDW: 13.8 % (ref 11.5–15.5)
WBC: 4.1 10*3/uL (ref 4.0–10.5)
nRBC: 0 % (ref 0.0–0.2)

## 2023-01-14 LAB — RESPIRATORY PANEL BY PCR

## 2023-01-14 LAB — ECHOCARDIOGRAM COMPLETE
AR max vel: 1.82 cm2
AV Area VTI: 1.92 cm2
AV Area mean vel: 2.19 cm2
AV Mean grad: 4.6 mmHg
AV Peak grad: 10.9 mmHg
Ao pk vel: 1.65 m/s
Area-P 1/2: 2.87 cm2
Height: 68 in
S' Lateral: 3 cm
Weight: 2691.38 oz

## 2023-01-14 LAB — COMPREHENSIVE METABOLIC PANEL
ALT: 12 U/L (ref 0–44)
AST: 21 U/L (ref 15–41)
Albumin: 3.4 g/dL — ABNORMAL LOW (ref 3.5–5.0)
Alkaline Phosphatase: 113 U/L (ref 38–126)
Anion gap: 10 (ref 5–15)
BUN: 22 mg/dL (ref 8–23)
CO2: 31 mmol/L (ref 22–32)
Calcium: 9.1 mg/dL (ref 8.9–10.3)
Chloride: 94 mmol/L — ABNORMAL LOW (ref 98–111)
Creatinine, Ser: 1.5 mg/dL — ABNORMAL HIGH (ref 0.44–1.00)
GFR, Estimated: 33 mL/min — ABNORMAL LOW (ref >60)
Glucose, Bld: 247 mg/dL — ABNORMAL HIGH (ref 70–99)
Potassium: 3.5 mmol/L (ref 3.5–5.1)
Sodium: 135 mmol/L (ref 135–145)
Total Bilirubin: 0.7 mg/dL (ref 0.3–1.2)
Total Protein: 7.8 g/dL (ref 6.5–8.1)

## 2023-01-14 LAB — TSH: TSH: 0.657 u[IU]/mL (ref 0.350–4.500)

## 2023-01-14 MED ORDER — INSULIN ASPART 100 UNIT/ML IJ SOLN: 0.0000 [IU] | Freq: Every day | INTRAMUSCULAR | Status: DC

## 2023-01-14 MED ORDER — AZITHROMYCIN 250 MG PO TABS
250.0000 mg | ORAL_TABLET | Freq: Every day | ORAL | Status: DC
  Administered 2023-01-15 – 2023-01-16 (×2): 250 mg via ORAL
  Filled 2023-01-14 (×2): qty 1

## 2023-01-14 MED ORDER — PREDNISONE 20 MG PO TABS
40.0000 mg | ORAL_TABLET | Freq: Every day | ORAL | Status: DC
  Administered 2023-01-15 – 2023-01-16 (×2): 40 mg via ORAL
  Filled 2023-01-14 (×2): qty 2

## 2023-01-14 MED ORDER — TECHNETIUM TO 99M ALBUMIN AGGREGATED
4.0000 | Freq: Once | INTRAVENOUS | Status: AC | PRN
  Administered 2023-01-14: 4.4 via INTRAVENOUS

## 2023-01-14 MED ORDER — POTASSIUM CHLORIDE CRYS ER 20 MEQ PO TBCR
20.0000 meq | EXTENDED_RELEASE_TABLET | Freq: Once | ORAL | Status: AC
  Administered 2023-01-14: 20 meq via ORAL
  Filled 2023-01-14: qty 1

## 2023-01-14 MED ORDER — INSULIN ASPART 100 UNIT/ML IJ SOLN
0.0000 [IU] | Freq: Three times a day (TID) | INTRAMUSCULAR | Status: DC
  Administered 2023-01-15 (×3): 2 [IU] via SUBCUTANEOUS
  Administered 2023-01-16: 1 [IU] via SUBCUTANEOUS
  Administered 2023-01-16: 2 [IU] via SUBCUTANEOUS

## 2023-01-14 MED ORDER — FUROSEMIDE 10 MG/ML IJ SOLN
60.0000 mg | Freq: Every day | INTRAMUSCULAR | Status: DC
  Administered 2023-01-14 – 2023-01-15 (×2): 60 mg via INTRAVENOUS
  Filled 2023-01-14 (×2): qty 6

## 2023-01-14 NOTE — Progress Notes (Signed)
Mobility Specialist Progress Note:    01/14/23 1015  Mobility  Activity Ambulated with assistance in room  Level of Assistance Contact guard assist, steadying assist  Assistive Device Front wheel walker  Distance Ambulated (ft) 12 ft  Range of Motion/Exercises Active;All extremities  Activity Response Tolerated well  Mobility Referral Yes  $Mobility charge 1 Mobility  Mobility Specialist Start Time (ACUTE ONLY) 1015  Mobility Specialist Stop Time (ACUTE ONLY) 1027  Mobility Specialist Time Calculation (min) (ACUTE ONLY) 12 min   Pt received in chair, agreeable to mobility session. Ambulated in room via RW and CGA for safety. Tolerated well, audible SOB throughout, SpO2 86% on 2.5L near end of session. Pt recovered, SpO2 93% on 2.5L. Left pt in chair, all needs met, call bell in reach.   Feliciana Rossetti Mobility Specialist Please contact via Special educational needs teacher or  Rehab office at [phone]

## 2023-01-14 NOTE — Consult Note (Addendum)
Cardiology Consultation   Patient ID: Amber Hull MRN: [id number]; DOB: 08-12-1931  Admit date: 01/13/2023 Date of Consult: 01/14/2023  PCP:  Donetta Potts, MD   [facility] Providers Cardiologist:  Dina Rich, MD        Patient Profile:   Amber Hull is a 89 y.o. female with a hx of paroxysmal atrial fibrillation, chronic HFpEF, mitral stenosis, chronic hypoxic respiratory failure and Stage 3 CKD who is being seen 01/14/2023 for the evaluation of CHF at the request of Dr. Jarvis Newcomer.  History of Present Illness:   Amber Hull was evaluated in clinic on 01/13/2023 and reported episodes of chest pain and dyspnea on exertion. Her weight was recorded at 163 lbs and she did appear volume overloaded by examination.  She was also in respiratory distress on examination and oxygen saturations were 79% while on supplemental oxygen. Given this, it was recommend that she present to the ED for further evaluation.  Initial labs showed WBC 9.1, Hgb 11.5, platelets 244, Na+ 136, K+ 4.6 and creatinine 1.46 (close to baseline). BNP elevated to 659. D-dimer elevated to 0.59. Initial and repeat Hs Troponin negative at 8.  CXR showing persistent extensive interstitial changes with pleural thickening or tiny effusions. VQ scan performed and results pending at this time. EKG shows rate-controlled atrial fibrillation, heart rate 72 with right axis deviation and no acute ST abnormalities.  She was admitted for further management of acute hypoxic respiratory failure in the setting of an acute CHF exacerbation. She was continued on ASA 81 mg daily, Bisoprolol 5 mg daily and Cardizem CD 300 mg daily. Was started on IV Lasix 40 mg daily and has only received 1 dose thus far along with an extra 20mg  while in the ED. I's and O's not recorded and weight listed is 168 lbs today.   In talking with the patient today, she reports having dyspnea on exertion since 2018 as she she was prescribed  supplemental oxygen at that time. Followed by Pulmonology in [place], [place] but missed her last appointment. Reports having worsening dyspnea for the past few weeks at rest and with activity. Felt she might have a cold. Reports a productive cough with white glue-like phlegm. Reports some chest pain with coughing. Feels bloated along her abdomen at times but felt this was due to poor food choices that did not agree with her. No lower extremity edema. Says she only takes Lasix as needed. Took 60mg  one day last week and it took several hours for her to respond to this.    Past Medical History:  Diagnosis Date   Anemia    Arthritis    Asthma    Atrial fibrillation (HCC)    CHF (congestive heart failure) (HCC)    COPD (chronic obstructive pulmonary disease) (HCC)    History of nuclear stress test 2017   intermediate risk   History of shingles    HOH (hard of hearing)    Mitral stenosis    Neuralgia    On home O2    3L N/C    Past Surgical History:  Procedure Laterality Date   ABDOMINAL HYSTERECTOMY     BIOPSY  08/04/2017   Procedure: BIOPSY;  Surgeon: West Bali, MD;  [facility];  Service: Endoscopy;;  duodenal gastric   CHOLECYSTECTOMY     COLONOSCOPY WITH PROPOFOL N/A 08/04/2017   Procedure: COLONOSCOPY WITH PROPOFOL;  Surgeon: West Bali, MD;  [facility]: [facility];  Service: Endoscopy;  Laterality: N/A;  8:30am   ESOPHAGOGASTRODUODENOSCOPY (EGD) WITH PROPOFOL N/A 08/04/2017   Procedure: ESOPHAGOGASTRODUODENOSCOPY (EGD) WITH PROPOFOL;  Surgeon: West Bali, MD;  [facility];  Service: Endoscopy;  Laterality: N/A;   FASCIECTOMY Right 10/17/2014   Procedure: FASCIECTOMY RIGHT RING/SMALL FINGERS;  Surgeon: Cindee Salt, MD;  Location: [facility];  Service: Orthopedics;  Laterality: Right;   KNEE ARTHROSCOPY     left   POLYPECTOMY  08/04/2017   Procedure: POLYPECTOMY;  Surgeon: West Bali, MD;  [facility];  Service:  Endoscopy;;  colon   TUBAL LIGATION     TYMPANOSTOMY TUBE PLACEMENT     both ears     Home Medications:  Prior to Admission medications   Medication Sig Start Date End Date Taking? Authorizing Provider  albuterol (PROVENTIL HFA;VENTOLIN HFA) 108 (90 BASE) MCG/ACT inhaler Inhale into the lungs every 6 (six) hours as needed for wheezing or shortness of breath.    [provider]  albuterol (PROVENTIL) (2.5 MG/3ML) 0.083% nebulizer solution Take 2.5 mg by nebulization every 6 (six) hours as needed for wheezing or shortness of breath.    [provider]  aspirin EC 81 MG tablet Take 81 mg by mouth daily. Swallow whole.    [provider]  bisoprolol (ZEBETA) 5 MG tablet Take 1 tablet (5 mg total) by mouth daily. 12/18/22   Antoine Poche, MD  calcitRIOL (ROCALTROL) 0.25 MCG capsule Take 0.25 mcg by mouth 3 (three) times a week. 10/17/20   [provider]  calcium carbonate (OS-CAL) 600 MG TABS tablet Take 600 mg by mouth. Doesn't take everyday    [provider]  cholecalciferol (VITAMIN D) 1000 UNITS tablet Take 1,000 Units by mouth daily.    [provider]  diltiazem (CARDIZEM CD) 300 MG 24 hr capsule Take 1 capsule by mouth once daily 12/01/22   Antoine Poche, MD  esomeprazole (NEXIUM) 40 MG capsule 1 PO 30 MINS PRIOR TO FIRST MEAL 08/04/17   Fields, Darleene Cleaver, MD  furosemide (LASIX) 40 MG tablet Take 1 tablet (40 mg total) by mouth daily as needed (for leg swelling or worsening shortness of breath). 12/18/22   Antoine Poche, MD  gabapentin (NEURONTIN) 400 MG capsule Take 400 mg by mouth 3 (three) times daily.    [provider]  hydrOXYzine (ATARAX/VISTARIL) 25 MG tablet Take 25 mg by mouth at bedtime.    [provider]  MAGNESIUM PO Take 1 tablet by mouth daily.    [provider]  montelukast (SINGULAIR) 10 MG tablet Take 10 mg by mouth at bedtime.    [provider]  potassium chloride SA  (KLOR-CON) 20 MEQ tablet Take 1 tablet (20 mEq total) by mouth daily. 10/17/20   Triplett, Tammy, PA-C  vitamin A 3 MG (10000 UNITS) capsule Take by mouth.    [provider]  vitamin B-12 (CYANOCOBALAMIN) 1000 MCG tablet Take 1,000 mcg by mouth daily.    [provider]  vitamin C (ASCORBIC ACID) 500 MG tablet Take 500 mg by mouth daily.    [provider]    Inpatient Medications: Scheduled Meds:  albuterol  2.5 mg Nebulization TID   aspirin EC  81 mg Oral Daily   bisoprolol  5 mg Oral Daily   diltiazem  300 mg Oral Daily   enoxaparin (LOVENOX) injection  30 mg Subcutaneous Q24H   furosemide  40 mg Intravenous Daily   gabapentin  400 mg  Oral TID   methylPREDNISolone (SOLU-MEDROL) injection  40 mg Intravenous Q12H   montelukast  10 mg Oral QHS   nitroGLYCERIN  0.5 inch Topical Q8H   pantoprazole  40 mg Oral Daily   potassium chloride SA  20 mEq Oral Daily   umeclidinium bromide  1 puff Inhalation Daily   Continuous Infusions:  azithromycin 500 mg (01/13/23 2359)   PRN Meds: acetaminophen **OR** acetaminophen, albuterol, benzonatate, hydrALAZINE, ondansetron **OR** ondansetron (ZOFRAN) IV  Allergies:    Allergies  Allergen Reactions   Penicillins Rash    Has patient had a PCN reaction causing immediate rash, facial/tongue/throat swelling, SOB or lightheadedness with hypotension: No Has patient had a PCN reaction causing severe rash involving mucus membranes or skin necrosis: No Has patient had a PCN reaction that required hospitalization: No Has patient had a PCN reaction occurring within the last 10 years: No If all of the above answers are "NO", then may proceed with Cephalosporin use.     Social History:   Social History   Socioeconomic History   Marital status: Widowed    Spouse name: Not on file   Number of children: Not on file   Years of education: Not on file   Highest education level: Not on file  Occupational History   Not on file   Tobacco Use   Smoking status: Former    Packs/day: 0.25    Years: 10.00    Additional pack years: 0.00    Total pack years: 2.50    Types: Cigarettes    Quit date: 10/10/1986    Years since quitting: 36.2   Smokeless tobacco: Never  Vaping Use   Vaping Use: Never used  Substance and Sexual Activity   Alcohol use: No   Drug use: No   Sexual activity: Not Currently    Birth control/protection: Surgical  Other Topics Concern   Not on file  Social History Narrative   Not on file   Social Determinants of Health   Financial Resource Strain: Not on file  Food Insecurity: No Food Insecurity (01/13/2023)   Hunger Vital Sign    Worried About Running Out of Food in the Last Year: Never true    Ran Out of Food in the Last Year: Never true  Transportation Needs: No Transportation Needs (01/13/2023)   PRAPARE - Administrator, Civil Service (Medical): No    Lack of Transportation (Non-Medical): No  Physical Activity: Not on file  Stress: Not on file  Social Connections: Not on file  Intimate Partner Violence: Not At Risk (01/13/2023)   Humiliation, Afraid, Rape, and Kick questionnaire    Fear of Current or Ex-Partner: No    Emotionally Abused: No    Physically Abused: No    Sexually Abused: No    Family History:    Family History  Problem Relation Age of Onset   Kidney cancer Mother    Colon polyps Sister    Colon cancer Neg Hx      ROS:  Please see the history of present illness.   All other ROS reviewed and negative.     Physical Exam/Data:   Vitals:   01/14/23 0343 01/14/23 0400 01/14/23 0810 01/14/23 0812  BP: (!) 147/71     Pulse: 79     Resp: 20     Temp: 99.2 F (37.3 C)     TempSrc:      SpO2: 93%  94% 98%  Weight:  76.3 kg  Height:        Intake/Output Summary (Last 24 hours) at 01/14/2023 0903 Last data filed at 01/14/2023 0700 Gross per 24 hour  Intake 372.23 ml  Output 200 ml  Net 172.23 ml      01/14/2023    4:00 AM 01/13/2023   11:42  PM 01/13/2023    3:29 PM  Last 3 Weights  Weight (lbs) 168 lb 3.4 oz 168 lb 3.4 oz 163 lb  Weight (kg) 76.3 kg 76.3 kg 73.936 kg     Body mass index is 25.58 kg/m.  General:  Pleasant, elderly female appearing in no acute distress. Very hard of hearing and hears best out of her right ear.  HEENT: normal Neck: no JVD Vascular: No carotid bruits; Distal pulses 2+ bilaterally Cardiac:  normal S1, S2; Irregularly irregular; 2/6 systolic murmur along Apex.  Lungs: decreased breath sounds along bases bilaterally.  Abd: soft, nontender, appears mildly distended.  Ext: no pitting edema Musculoskeletal:  No deformities, BUE and BLE strength normal and equal Skin: warm and dry  Neuro:  CNs 2-12 intact, no focal abnormalities noted Psych:  Normal affect   EKG:  The EKG was personally reviewed and demonstrates: Rate-controlled atrial fibrillation, heart rate 72 with right axis deviation and no acute ST abnormalities.  Telemetry:  Telemetry was personally reviewed and demonstrates: Atrial fibrillation, HR in 70's to 80's with PVC's and one episode of 8 beats NSVT.   Relevant CV Studies:  Echocardiogram: 12/2020 IMPRESSIONS     1. Left ventricular ejection fraction, by estimation, is 60 to 65%. The  left ventricle has normal function. The left ventricle has no regional  wall motion abnormalities. Left ventricular diastolic parameters are  indeterminate.   2. RV-RA gradient 36 mmHg suggesting at least mildly increased RVSP.  Right ventricular systolic function is normal. The right ventricular size  is normal.   3. Left atrial size was mildly dilated.   4. The mitral valve appears rheumatic, mildly thickened with restricted  leaflet motion. Moderate mitral valve regurgitation. Moderate mitral  stenosis. The mean mitral valve gradient is 5.5 mmHg. Valve area 1.25 cm2  by planimetry.   5. The aortic valve is tricuspid. There is mild calcification of the  aortic valve. Aortic valve  regurgitation is not visualized.   6. Unable to estimate CVP.   Laboratory Data:  High Sensitivity Troponin:   Recent Labs  Lab 01/13/23 1747 01/13/23 1957 01/13/23 2209  TROPONINIHS 8 8 8      Chemistry Recent Labs  Lab 01/13/23 1747 01/14/23 0436  NA 136 135  K 4.6 3.5  CL 97* 94*  CO2 29 31  GLUCOSE 92 247*  BUN 18 22  CREATININE 1.46* 1.50*  CALCIUM 9.4 9.1  GFRNONAA 34* 33*  ANIONGAP 10 10    Recent Labs  Lab 01/14/23 0436  PROT 7.8  ALBUMIN 3.4*  AST 21  ALT 12  ALKPHOS 113  BILITOT 0.7   Lipids No results for input(s): "CHOL", "TRIG", "HDL", "LABVLDL", "LDLCALC", "CHOLHDL" in the last 168 hours.  Hematology Recent Labs  Lab 01/13/23 1747 01/14/23 0436  WBC 9.1 4.1  RBC 3.91 3.38*  HGB 11.5* 9.9*  HCT 36.8 30.6*  MCV 94.1 90.5  MCH 29.4 29.3  MCHC 31.3 32.4  RDW 14.0 13.8  PLT 244 213   Thyroid  Recent Labs  Lab 01/14/23 0436  TSH 0.657    BNP Recent Labs  Lab 01/13/23 1747  BNP 659.0*    DDimer  Recent  Labs  Lab 01/13/23 1747  DDIMER 0.59*     Radiology/Studies:  DG Chest Portable 1 View  Result Date: 01/13/2023 CLINICAL DATA:  Chest pain and shortness of breath EXAM: PORTABLE CHEST 1 VIEW COMPARISON:  10/17/2020 FINDINGS: Once again there is diffuse interstitial changes of the lungs with tiny effusions or pleural thickening. Enlarged cardiopericardial silhouette with a calcified aorta. No pneumothorax. Overlapping cardiac leads. Significant calcifications along the tracheobronchial tree. IMPRESSION: Persistent extensive interstitial changes with pleural thickening or tiny effusions. Enlarged heart. Please correlate for any known history Electronically Signed   By: Karen Kays M.D.   On: 01/13/2023 17:13     Assessment and Plan:   1. Acute HFpEF - Reports worsening dyspnea on exertion over the past week and BNP elevated at 659 on admission and CXR showing interstitial changes with pleural thickening or 20 effusions. D-dimer was  elevated and V/Q scan performed this morning with results pending. She would be at increased risk for a PE given her atrial fibrillation and not on anticoagulation due to prior issues with anemia.  - She is current receiving IV Lasix 40 mg daily and anticipate titrating her dose. Follow I&O's along with daily weights. She reports variable PO intake at home and weight was at 163 lbs on the office scales and at 168 lbs on the hospital scales. Will need to establish a new dry weight.   2. Acute on Chronic Hypoxic Respiratory Failure - She is on 2 L nasal cannula at baseline but did require 3 L on admission  Currently at 2.5 L with appropriate oxygen saturations. Will continue to diurese as discussed above and V/Q scan results are pending. She does need to reestablish with her pulmonologist as an outpatient.  3. Persistent Atrial Fibrillation - Rates have been in the 70's to 80's on telemetry. Remains on Cardizem CD 300mg  daily and Bisoprolol 5mg  daily.  - By review of notes, she has not been on anticoagulation given her anemia. Not sure she would be an ideal candidate for Watchman Device placement as she would need to be able to tolerate anticoagulation at least short-term.   4. NSVT - She did have 8 beats NSVT on telemetry. K+ at 3.5 this AM and will order additional supplementation to keep ~ 4.0. Will check Mg.   5. Valvular Heart Disease - Echo in 2022 showed her mitral valve appeared rheumatic with moderate MS and moderate MR. Repeat echo pending.   6. Stage 3 CKD - Baseline creatinine 1.4 - 1.5. Stable at 1.50 today. Continue to follow with diuresis.    For questions or updates, please contact [facility] Please consult [email or web address] for contact info under    Signed, Ellsworth Lennox, PA-C  01/14/2023 9:03 AM   Patient seen and discussed with PA Iran Ouch, I agree with her documentation. 89 yo female history of afib not on anticoag due to anemia, chronic HFpEF, chronic anemia,  chronic respiratory failure on 4-5 L [place] CKD, presented from cardiology clinic to ER with SOB.      WBC 9.1 Hgb 11.5 Plt 244 K 4.6 Cr 1.46 BUN 18 BNP 659 Ddimer 0.59  Trop 8-->8-->8 CXR persistent interstitial changes EKG rate controlled afib VQ scan pending   Echo 12/2020: LVEF 60-65%, no WMAs, indet diastolic, normal RV, mod mitral stenosis         1.Acute on chronic HFpEF - Echo 12/2020: LVEF 60-65%, no WMAs, indet diastolic, normal RV, mod mitral stenosis - CXR chronic interstitial markings, bnp  659   - yesterday received IV lasix 20mg  then 40mg , due for 40mg  daily today. Early incomplete I/Os data. Follow diuresis today, would increase lasix to 60mg  daily.      2. COPD exacerbation - per primary team   3. Afib - has not been on anticoag due to recurrent issues with anemia - anemia from notes due to chronic disease, prior iron deficient anemia and had received IV iron. Benign 2019 GI workup     4. CKD - follow Cr with diuresis  5. Mitral stenosis - repeat echo  Dina Rich MD

## 2023-01-14 NOTE — TOC Initial Note (Signed)
Transition of Care [facility]) - Initial/Assessment Note    Patient Details  Name: Amber Hull MRN: [id number] Date of Birth: [birth year]  Transition of Care [facility]) CM/SW Contact:    Karn Cassis, LCSW Phone Number: 01/14/2023, 12:18 PM  Clinical Narrative:  Pt admitted due to acute respiratory failure with hypoxia secondary to acute CHF. Pt reports she lives alone and is independent with ADLs. Pt still drives. She has home O2, but is unsure what agency it is with. Pt plans to return home when medically stable. TOC received consult for CHF screening. Pt was diagnosed with CHF several years ago. She weighs herself a couple times a week. Pt does not closely follow a heart healthy diet. She takes medications as prescribed. CHF education added to AVS. Discussed HHRN with pt for additional CHF education. Pt agreeable and called previous HHRN on phone. Myriam Jacobson states she no longer works for [employer]. LCSW referred to [facility], but they do not have an Charity fundraiser available. Pt updated and said Myriam Jacobson plans to follow up with her and does not feel HHRN is needed.                Expected Discharge Plan: Home/Self Care Barriers to Discharge: Continued Medical Work up   Patient Goals and CMS Choice Patient states their goals for this hospitalization and ongoing recovery are:: return home   Choice offered to / list presented to : Patient [place] ownership interest in [facility].provided to::  (n/a)    Expected Discharge Plan and Services In-house Referral: Clinical Social Work     Living arrangements for the past 2 months: Single Family Home                                      Prior Living Arrangements/Services Living arrangements for the past 2 months: Single Family Home Lives with:: Self Patient language and need for interpreter reviewed:: Yes Do you feel safe going back to the place where you live?: Yes      Need for Family Participation in Patient Care: No  (Comment)   Current home services: DME (home O2) Criminal Activity/Legal Involvement Pertinent to Current Situation/Hospitalization: No - Comment as needed  Activities of Daily Living Home Assistive Devices/Equipment: Cane (specify quad or straight), Eyeglasses, Oxygen ADL Screening (condition at time of admission) Patient's cognitive ability adequate to safely complete daily activities?: Yes Is the patient deaf or have difficulty hearing?: Yes Does the patient have difficulty seeing, even when wearing glasses/contacts?: Yes (wears reading glasses) Does the patient have difficulty concentrating, remembering, or making decisions?: No Patient able to express need for assistance with ADLs?: Yes Does the patient have difficulty dressing or bathing?: No Independently performs ADLs?: Yes (appropriate for developmental age) Does the patient have difficulty walking or climbing stairs?: Yes Weakness of Legs: Both Weakness of Arms/Hands: None  Permission Sought/Granted                  Emotional Assessment     Affect (typically observed): Appropriate Orientation: : Oriented to Self, Oriented to Place, Oriented to  Time, Oriented to Situation Alcohol / Substance Use: Not Applicable Psych Involvement: No (comment)  Admission diagnosis:  COPD exacerbation (HCC) [J44.1] Acute CHF (congestive heart failure) (HCC) [I50.9] Acute on chronic combined systolic and diastolic congestive heart failure (HCC) [I50.43] Patient Active Problem List   Diagnosis Date Noted   Acute respiratory failure with  hypoxia (HCC) 01/13/2023   Paroxysmal atrial fibrillation (HCC) 01/13/2023   Acute CHF (congestive heart failure) (HCC) 01/13/2023   Abnormal SPEP 12/27/2021   Normocytic anemia 05/14/2017   Atrial fibrillation with RVR (HCC) 05/14/2017   Chronic respiratory failure with hypoxia (HCC) 05/14/2017   SOB (shortness of breath) 05/14/2017   CHF (congestive heart failure) (HCC)    COPD (chronic  obstructive pulmonary disease) (HCC)    INSOMNIA 03/06/2009   OVERWEIGHT 12/05/2008   POSTHERPETIC NEURALGIA 09/14/2008   SHINGLES 09/14/2008   Allergic rhinitis 09/14/2008   GERD 09/14/2008   ARTHRITIS 09/14/2008   PCP:  Donetta Potts, MD Pharmacy:   [facility] [address] [place] - [address] [place] [address] Phone: [phone] Fax: [phone]     Social Determinants of Health (SDOH) Social History: SDOH Screenings   Food Insecurity: No Food Insecurity (01/13/2023)  Housing: Low Risk  (01/13/2023)  Transportation Needs: No Transportation Needs (01/13/2023)  Utilities: Not At Risk (01/13/2023)  Tobacco Use: Medium Risk (01/14/2023)   SDOH Interventions:     Readmission Risk Interventions     No data to display

## 2023-01-14 NOTE — Progress Notes (Signed)
[facility] PROGRESS NOTE  Amber Hull (DOB: October 12, 1931) ZOX:[id number] PCP: Donetta Potts, MD  Brief Narrative: Amber Hull is an 89 y.o. female with a history of chronic hypoxic respiratory failure, stage IIIb CKD, chronic HFpEF, atrial fibrillation and anemia who presented to the ED on 01/13/2023 on the advice of cardiology clinic due to respiratory distress with worsened hypoxia in the office, subsequently admitted for acute on chronic hypoxic respiratory failure due to acute HFpEF.   Subjective: Breathing much better than at admission, only complaint currently is breakfast was cold.   Objective: BP (!) 171/81 (BP Location: Right Arm)   Pulse 96   Temp 98.4 F (36.9 C) (Oral)   Resp 20   Ht 5\' 8"  (1.727 m)   Wt 76.3 kg   SpO2 99%   BMI 25.58 kg/m   Gen: Pleasant elderly female in no distress Pulm: Crackles throughout, no wheezes, tachypneic with normal respiratory effort  CV: Irreg irreg, systolic murmur at LSB, no JVD, no significant edema GI: Soft, NT, ND, +BS  Neuro: Alert and oriented. No new focal deficits. Ext: Warm, no deformities Skin: No rashes, lesions or ulcers on visualized skin   Assessment & Plan: Principal Problem:   Acute respiratory failure with hypoxia (HCC) Active Problems:   Allergic rhinitis   GERD   Normocytic anemia   CHF (congestive heart failure) (HCC)   COPD (chronic obstructive pulmonary disease) (HCC)   Abnormal SPEP   Paroxysmal atrial fibrillation (HCC)   Acute CHF (congestive heart failure) (HCC)  Acute on chronic hypoxic respiratory failure: Distress in clinic, improved now. BNP 659 which is above her elevated baseline. Troponin negative x3. Has been on O2 for 6 years. - Continue diuresis below for acute CHF - continue steroids, reduce intensity with resolution of wheezing - D-dimer below age-adjusted normal limits. Renal impairment relatively contraindicates CTA chest, V/Q overall low risk for PE within  confines of abnormal CXR.  - Mildly elevated eosinophils noted to be dating back at least 5 years ?ABPA. Consider CT chest without contrast if not continuing to improve. - Nondependent crackles on exam and coarse interstitial markings suggest ILD as underlying etiology, needs pulmonary follow up in [place], [place].   Acute on chronic HFpEF:  - Continue IV diuresis, monitor I/O, daily weights, and renal function.  - Appreciate cardiology recommendations - Repeat echocardiogram.  AECOPD:  - Decrease to prednisone 40mg  daily - Continue azithromycin x5 days - Continue singulair - Continue bronchodilators and antitussives. Check virus panel.   Hyperglycemia:  - Check HbA1c, start SSI suspecting steroid-induced hyperglycemia.   PAF: TSH wnl at 0.657.  - Continue cardioselective beta blocker and diltiazem - Keep K and Mg replete, supplement in light of ongoing loop diuretic  Iron deficiency anemia, MGUS: Per records, has MGUS, last bone survey negative. Per report, had reassuring GI work up 2019. - Follow up with hematology, Dr. Ellin Saba. - Anemia panel and CBC in AM.  Stage IIIb CKD:  - Followed by Dr. Wolfgang Phoenix. Continue calcitriol for secondary hyperparathyroidism.  - Monitor Cr closely - Gabapentin dosing is at upper limit for her renal function, will dose as indicated.  GERD:  - PPI.   Tyrone Nine, MD [facility] [email or web address] 01/14/2023, 5:42 PM

## 2023-01-14 NOTE — Progress Notes (Signed)
*  PRELIMINARY RESULTS* Echocardiogram 2D Echocardiogram has been performed.  Amber Hull 01/14/2023, 12:12 PM

## 2023-01-15 ENCOUNTER — Inpatient Hospital Stay (HOSPITAL_COMMUNITY): Payer: Medicare PPO

## 2023-01-15 DIAGNOSIS — J9601 Acute respiratory failure with hypoxia: Secondary | ICD-10-CM | POA: Diagnosis not present

## 2023-01-15 LAB — CBC
HCT: 31.3 % — ABNORMAL LOW (ref 36.0–46.0)
Hemoglobin: 10 g/dL — ABNORMAL LOW (ref 12.0–15.0)
MCH: 29.1 pg (ref 26.0–34.0)
MCHC: 31.9 g/dL (ref 30.0–36.0)
MCV: 91 fL (ref 80.0–100.0)
Platelets: 251 10*3/uL (ref 150–400)
RBC: 3.44 MIL/uL — ABNORMAL LOW (ref 3.87–5.11)
RDW: 14.2 % (ref 11.5–15.5)
WBC: 16.1 10*3/uL — ABNORMAL HIGH (ref 4.0–10.5)
nRBC: 0 % (ref 0.0–0.2)

## 2023-01-15 LAB — RETICULOCYTES
Immature Retic Fract: 22.2 % — ABNORMAL HIGH (ref 2.3–15.9)
RBC.: 3.42 MIL/uL — ABNORMAL LOW (ref 3.87–5.11)
Retic Count, Absolute: 61.9 10*3/uL (ref 19.0–186.0)
Retic Ct Pct: 1.8 % (ref 0.4–3.1)

## 2023-01-15 LAB — BASIC METABOLIC PANEL
Anion gap: 10 (ref 5–15)
BUN: 35 mg/dL — ABNORMAL HIGH (ref 8–23)
CO2: 32 mmol/L (ref 22–32)
Calcium: 9.4 mg/dL (ref 8.9–10.3)
Chloride: 92 mmol/L — ABNORMAL LOW (ref 98–111)
Creatinine, Ser: 1.53 mg/dL — ABNORMAL HIGH (ref 0.44–1.00)
GFR, Estimated: 32 mL/min — ABNORMAL LOW (ref >60)
Glucose, Bld: 168 mg/dL — ABNORMAL HIGH (ref 70–99)
Potassium: 4.6 mmol/L (ref 3.5–5.1)
Sodium: 134 mmol/L — ABNORMAL LOW (ref 135–145)

## 2023-01-15 LAB — FOLATE: Folate: 7.1 ng/mL (ref >5.9)

## 2023-01-15 LAB — HEMOGLOBIN A1C
Hgb A1c MFr Bld: 5.2 % (ref 4.8–5.6)
Mean Plasma Glucose: 102.54 mg/dL

## 2023-01-15 LAB — VITAMIN B12: Vitamin B-12: 2934 pg/mL — ABNORMAL HIGH (ref 180–914)

## 2023-01-15 LAB — GLUCOSE, CAPILLARY
Glucose-Capillary: 156 mg/dL — ABNORMAL HIGH (ref 70–99)
Glucose-Capillary: 186 mg/dL — ABNORMAL HIGH (ref 70–99)
Glucose-Capillary: 187 mg/dL — ABNORMAL HIGH (ref 70–99)
Glucose-Capillary: 181 mg/dL — ABNORMAL HIGH (ref 70–99)

## 2023-01-15 LAB — IRON AND TIBC
Iron: 30 ug/dL (ref 28–170)
Saturation Ratios: 11 % (ref 10.4–31.8)
TIBC: 279 ug/dL (ref 250–450)
UIBC: 249 ug/dL

## 2023-01-15 LAB — FERRITIN: Ferritin: 51 ng/mL (ref 11–307)

## 2023-01-15 LAB — MAGNESIUM: Magnesium: 1.8 mg/dL (ref 1.7–2.4)

## 2023-01-15 MED ORDER — GABAPENTIN 100 MG PO CAPS
200.0000 mg | ORAL_CAPSULE | Freq: Three times a day (TID) | ORAL | Status: DC
  Administered 2023-01-15 – 2023-01-16 (×3): 200 mg via ORAL
  Filled 2023-01-15 (×3): qty 2

## 2023-01-15 MED ORDER — POTASSIUM CHLORIDE CRYS ER 20 MEQ PO TBCR
20.0000 meq | EXTENDED_RELEASE_TABLET | Freq: Every day | ORAL | Status: DC
  Administered 2023-01-16: 20 meq via ORAL
  Filled 2023-01-15: qty 1

## 2023-01-15 NOTE — Care Management Important Message (Signed)
Important Message  Patient Details  Name: Amber Hull MRN: [id number] Date of Birth: 12-Sep-1931   Medicare Important Message Given:  N/A - LOS <3 / Initial given by admissions     Corey Harold 01/15/2023, 11:15 AM

## 2023-01-15 NOTE — Evaluation (Signed)
Physical Therapy Evaluation Patient Details Name: Amber Hull MRN: [id number] DOB: 30-Jun-1932 Today's Date: 01/15/2023  History of Present Illness  Amber Hull is a 89 y.o. female with medical history significant of Hypertension, Pafib, Chronic diastolic CHF (EF 53-66%), mod MR mod MS, CKD stage 3a , Anemia, Copd on home o2 4-5L, presents with increase in dyspnea today. + cough w clear sputum.  + orthopnea/ pnd.  + edema.  Pt notes slight left side cp, "throbbing" without radiation lasting minutes at about 5:30pm while at cardiology office.  Pt was sent from cardiology to ER for evaluation of hypoxia.   Clinical Impression  Patient limited for functional mobility as stated below secondary to BLE weakness and decreased activity tolerance.  Patient does not require assist to transition to seated EOB. She demonstrates good sitting balance and sitting tolerance at bedside. Patient transfers to standing and ambulates in room/hall with RW for mild balance deficits and c/o some quad weakness. Patient would benefit from rolling walker for return home in order to improve balance and reduce risk for falls. Patient discharged to care of nursing for ambulation daily as tolerated for length of stay.          Assistance Recommended at Discharge PRN  If plan is discharge home, recommend the following:  Can travel by private vehicle  A little help with bathing/dressing/bathroom;Assistance with cooking/housework;Assist for transportation        Equipment Recommendations Rolling walker (2 wheels)  Recommendations for Other Services       Functional Status Assessment Patient has had a recent decline in their functional status and demonstrates the ability to make significant improvements in function in a reasonable and predictable amount of time.     Precautions / Restrictions Precautions Precautions: Fall Restrictions Weight Bearing Restrictions: No      Mobility  Bed  Mobility Overal bed mobility: Modified Independent                  Transfers Overall transfer level: Modified independent Equipment used: Rolling walker (2 wheels) Transfers: Sit to/from Stand Sit to Stand: Modified independent (Device/Increase time), Supervision           General transfer comment: minimally labored    Ambulation/Gait Ambulation/Gait assistance: Modified independent (Device/Increase time), Supervision Gait Distance (Feet): 100 Feet Assistive device: Rolling walker (2 wheels) Gait Pattern/deviations: Step-through pattern Gait velocity: decreased     General Gait Details: gradually increasing SOB, mild unsteadiness intermittently while ambulating with RW  Stairs            Wheelchair Mobility     Tilt Bed    Modified Rankin (Stroke Patients Only)       Balance Overall balance assessment: Needs assistance Sitting-balance support: Bilateral upper extremity supported, Feet supported Sitting balance-Leahy Scale: Good Sitting balance - Comments: seated EOB   Standing balance support: Bilateral upper extremity supported, During functional activity Standing balance-Leahy Scale: Good Standing balance comment: good with RW                             Pertinent Vitals/Pain Pain Assessment Pain Assessment: No/denies pain    Home Living Family/patient expects to be discharged to:: Private residence Living Arrangements: Alone Available Help at Discharge: Family Type of Home: House Home Access: Stairs to enter   Secretary/administrator of Steps: 1 Alternate Level Stairs-Number of Steps: 12 Home Layout: Two level Home Equipment: Cane - single point;BSC/3in1  Prior Function Prior Level of Function : Independent/Modified Independent             Mobility Comments: ambulates with SPC ADLs Comments: patient states independent with ADL     Hand Dominance        Extremity/Trunk Assessment   Upper Extremity  Assessment Upper Extremity Assessment: Overall WFL for tasks assessed    Lower Extremity Assessment Lower Extremity Assessment: Overall WFL for tasks assessed;Generalized weakness    Cervical / Trunk Assessment Cervical / Trunk Assessment: Normal  Communication   Communication: HOH  Cognition Arousal/Alertness: Awake/alert Behavior During Therapy: WFL for tasks assessed/performed Overall Cognitive Status: Within Functional Limits for tasks assessed                                          General Comments      Exercises     Assessment/Plan    PT Assessment All further PT needs can be met in the next venue of care  PT Problem List Decreased strength;Decreased activity tolerance;Decreased balance;Decreased mobility       PT Treatment Interventions      PT Goals (Current goals can be found in the Care Plan section)  Acute Rehab PT Goals Patient Stated Goal: go home PT Goal Formulation: With patient Time For Goal Achievement: 01/15/23 Potential to Achieve Goals: Good    Frequency       Co-evaluation               AM-PAC PT "6 Clicks" Mobility  Outcome Measure Help needed turning from your back to your side while in a flat bed without using bedrails?: None Help needed moving from lying on your back to sitting on the side of a flat bed without using bedrails?: None Help needed moving to and from a bed to a chair (including a wheelchair)?: A Little Help needed standing up from a chair using your arms (e.g., wheelchair or bedside chair)?: A Little Help needed to walk in hospital room?: A Little Help needed climbing 3-5 steps with a railing? : A Little 6 Click Score: 20    End of Session Equipment Utilized During Treatment: Gait belt;Oxygen Activity Tolerance: Patient tolerated treatment well Patient left: in bed;with call bell/phone within reach Nurse Communication: Mobility status PT Visit Diagnosis: Other abnormalities of gait and mobility  (R26.89);Muscle weakness (generalized) (M62.81)    Time: 1010-1040 PT Time Calculation (min) (ACUTE ONLY): 30 min   Charges:   PT Evaluation $PT Eval Low Complexity: 1 Low PT Treatments $Therapeutic Activity: 23-37 mins PT General Charges $$ ACUTE PT VISIT: 1 Visit         1:48 PM, 01/15/23 Wyman Songster PT, DPT Physical Therapist at [facility]

## 2023-01-15 NOTE — Progress Notes (Addendum)
[facility] PROGRESS NOTE  Nelly Craun (DOB: [birth year]) ZOX:[id number] PCP: Donetta Potts, MD  Brief Narrative: Amber Hull is an 89 y.o. female with a history of chronic hypoxic respiratory failure, stage IIIb CKD, chronic HFpEF, atrial fibrillation and anemia who presented to the ED on 01/13/2023 on the advice of cardiology clinic due to respiratory distress with worsened hypoxia in the office, subsequently admitted for acute on chronic hypoxic respiratory failure due to acute HFpEF.   Subjective: Again feels her breathing has gotten better but still short of breath with minimal exertion, e.g. while working with PT didn't feel like her usual self. Has been urinating subjectively a lot. No chest pain.   Objective: BP (!) 143/78 (BP Location: Right Arm)   Pulse 90   Temp 97.6 F (36.4 C) (Oral)   Resp 16   Ht 5\' 8"  (1.727 m)   Wt 73.3 kg   SpO2 97%   BMI 24.56 kg/m   Gen: Pleasant, HOH elderly female in no distress Pulm: Crackles throughout, no wheezes, tachypneic on exam with normal respiratory effort  CV: Irreg irreg with stable systolic murmur at apex/LSB, trace edema in legs while sitting GI: Soft, NT, ND, +BS  Neuro: Alert and oriented. No new focal deficits. Ext: Warm, no deformities. Skin: No new rashes, lesions or ulcers on visualized skin   Assessment & Plan: Acute on chronic hypoxic respiratory failure: Distress in clinic, improved now. BNP 659 which is above her elevated baseline. Troponin negative x3. Has been on O2 for 6 years. - Continue diuresis below for acute CHF - Continue steroids and BDs for AECOPD as below - D-dimer below age-adjusted normal limits. Renal impairment relatively contraindicates CTA chest, V/Q overall low risk for PE within confines of abnormal CXR.  - Mildly elevated eosinophils noted to be dating back at least 5 years ?ABPA. Nondependent crackles on exam and coarse interstitial markings suggest ILD as underlying  etiology, needs pulmonary follow up in [place], [place]. Check CT chest without contrast  Acute on chronic HFpEF: LVEF 60-65% with rheumatic MV without severe valvular stenosis/insufficiency. Mod elevated PASP, normal RV size and function, dilated IVC. - Continue lasix 60mg  IV today, anticipate deescalation tomorrow. Down 76.3kg > 73.3kg and 2.1L thus far, Cr stable. Continue monitoring I/O, daily weights, and renal function.  - Appreciate cardiology recommendations  AECOPD: RVP negative. - Continue prednisone 40mg  daily - Continue azithromycin x5 days - Continue singulair - Continue bronchodilators and antitussives.   Hyperglycemia:  - Check HbA1c (still pending), continue SSI  PAF: TSH wnl at 0.657.  - Continue cardioselective beta blocker and diltiazem - Keep K and Mg replete, supplement in light of ongoing loop diuretic  Iron deficiency anemia, MGUS: Per records, has MGUS, last bone survey negative. Per report, had reassuring GI work up 2019. - Follow up with hematology, Dr. Ellin Saba. - Anemia panel shows no deficiency.  Stage IIIb CKD:  - Followed by Dr. Wolfgang Phoenix. Has follow up in the next week. Continue calcitriol for secondary hyperparathyroidism.  - Monitor Cr closely, stable. - Gabapentin dosing needs to be reduced based on her renal function, will dose as indicated.  GERD:  - PPI.   Tyrone Nine, MD [facility] [email or web address] 01/15/2023, 2:23 PM

## 2023-01-15 NOTE — TOC Progression Note (Signed)
Transition of Care [facility]) - Progression Note    Patient Details  Name: Amber Hull MRN: [id number] Date of Birth: Nov 13, 1931  Transition of Care [facility]) CM/SW Contact  Elliot Gault, LCSW Phone Number: 01/15/2023, 1:19 PM  Clinical Narrative:     TOC following. PT recommending HHPT and RW for dc. MD anticipating dc tomorrow.  Went to pt's room to review dc plan. Pt very sound asleep and TOC did not awaken her. Will follow up tomorrow to determine if pt would like TOC to arrange the [facility] and/or RW.  Expected Discharge Plan: Home/Self Care Barriers to Discharge: Continued Medical Work up  Expected Discharge Plan and Services In-house Referral: Clinical Social Work     Living arrangements for the past 2 months: Single Family Home                                       Social Determinants of Health (SDOH) Interventions SDOH Screenings   Food Insecurity: No Food Insecurity (01/13/2023)  Housing: Low Risk  (01/13/2023)  Transportation Needs: No Transportation Needs (01/13/2023)  Utilities: Not At Risk (01/13/2023)  Tobacco Use: Medium Risk (01/14/2023)    Readmission Risk Interventions     No data to display

## 2023-01-16 DIAGNOSIS — I5033 Acute on chronic diastolic (congestive) heart failure: Secondary | ICD-10-CM | POA: Diagnosis not present

## 2023-01-16 DIAGNOSIS — J9601 Acute respiratory failure with hypoxia: Secondary | ICD-10-CM | POA: Diagnosis not present

## 2023-01-16 LAB — MAGNESIUM: Magnesium: 1.9 mg/dL (ref 1.7–2.4)

## 2023-01-16 LAB — BASIC METABOLIC PANEL
Anion gap: 10 (ref 5–15)
BUN: 54 mg/dL — ABNORMAL HIGH (ref 8–23)
CO2: 31 mmol/L (ref 22–32)
Calcium: 9.1 mg/dL (ref 8.9–10.3)
Chloride: 92 mmol/L — ABNORMAL LOW (ref 98–111)
Creatinine, Ser: 1.87 mg/dL — ABNORMAL HIGH (ref 0.44–1.00)
GFR, Estimated: 25 mL/min — ABNORMAL LOW (ref >60)
Glucose, Bld: 114 mg/dL — ABNORMAL HIGH (ref 70–99)
Potassium: 3.8 mmol/L (ref 3.5–5.1)
Sodium: 133 mmol/L — ABNORMAL LOW (ref 135–145)

## 2023-01-16 LAB — GLUCOSE, CAPILLARY
Glucose-Capillary: 149 mg/dL — ABNORMAL HIGH (ref 70–99)
Glucose-Capillary: 159 mg/dL — ABNORMAL HIGH (ref 70–99)

## 2023-01-16 MED ORDER — ALBUTEROL SULFATE (2.5 MG/3ML) 0.083% IN NEBU: 2.5000 mg | INHALATION_SOLUTION | Freq: Two times a day (BID) | RESPIRATORY_TRACT | Status: DC

## 2023-01-16 MED ORDER — FUROSEMIDE 20 MG PO TABS: 20.0000 mg | ORAL_TABLET | Freq: Every day | ORAL | 0 refills | Status: DC

## 2023-01-16 MED ORDER — AZITHROMYCIN 250 MG PO TABS: 250.0000 mg | ORAL_TABLET | Freq: Every day | ORAL | 0 refills | Status: AC

## 2023-01-16 MED ORDER — PREDNISONE 20 MG PO TABS: 40.0000 mg | ORAL_TABLET | Freq: Every day | ORAL | 0 refills | Status: AC

## 2023-01-16 NOTE — TOC Transition Note (Addendum)
Transition of Care [facility]) - CM/SW Discharge Note   Patient Details  Name: Amber Hull MRN: [id number] Date of Birth: 05-04-1932  Transition of [facility]) CM/SW Contact:  Karn Cassis, LCSW Phone Number: 01/16/2023, 10:01 AM   Clinical Narrative: Anticipate d/c later today. PT recommending HHPT. LCSW reached out to [facility], [facility], and [facility] and none are able to accept. Pt lives in [place], [place]. Pt's daughter, Stanton Kidney asked about [facility] who will do in home therapy. Referral sent and LCSW confirmed with Tammy at [facility] that referral was received and they will call pt to schedule in home PT visit. PT also recommending walker. Stanton Kidney states she has a rollator and will use that if needed. No other needs reported.     Pt's portable tank is empty. LCSW contact [employer] who provides pt's home O2 and okay to give portable tank. LCSW delivered to room and notified RN.    Final next level of care: Home w Home Health Services Barriers to Discharge: Barriers Resolved   Patient Goals and CMS Choice   Choice offered to / list presented to : Patient  Discharge Placement                    Name of family member notified: Debra Patient and family notified of of transfer: 01/16/23  Discharge Plan and Services Additional resources added to the After Visit Summary for   In-house Referral: Clinical Social Work                        HH Arranged: PT HH Agency: Other - See comment [facility]) Date HH Agency Contacted: 01/16/23 Time HH Agency Contacted: 1001 Representative spoke with at [facility] Agency: Tammy  Social Determinants of Health (SDOH) Interventions SDOH Screenings   Food Insecurity: No Food Insecurity (01/13/2023)  Housing: Low Risk  (01/13/2023)  Transportation Needs: No Transportation Needs (01/13/2023)  Utilities: Not At Risk (01/13/2023)  Tobacco Use: Medium Risk (01/14/2023)     Readmission Risk Interventions     No  data to display

## 2023-01-16 NOTE — Care Management Important Message (Signed)
Important Message  Patient Details  Name: Amber Hull MRN: [id number] Date of Birth: 14-May-1932   Medicare Important Message Given:  N/A - LOS <3 / Initial given by admissions     Corey Harold 01/16/2023, 1:12 PM

## 2023-01-16 NOTE — Discharge Summary (Signed)
Physician Discharge Summary   Patient: Amber Hull MRN: [id number] DOB: [birth year]  Admit date:     01/13/2023  Discharge date: 01/16/23  Discharge Physician: Tyrone Nine   PCP: Donetta Potts, MD   Recommendations at discharge:  Follow up with pulmonology after discharge. Pt requests referral to Pulmonology in  (ordered). Suggest HRCT and work up for ILD once recovered from acute illness.  Follow up with cardiology 7/23, discharged on lasix 20mg  daily. Follow up with PCP in 1-2 weeks. Follow up with nephrology as scheduled, will need repeat renal function panel. Suggest swallowing evaluation nonemergently since debris noted on CT chest.  Discharge Diagnoses: Principal Problem:   Acute respiratory failure with hypoxia (HCC) Active Problems:   Allergic rhinitis   GERD   Normocytic anemia   CHF (congestive heart failure) (HCC)   COPD (chronic obstructive pulmonary disease) (HCC)   Abnormal SPEP   Paroxysmal atrial fibrillation (HCC)   Acute CHF (congestive heart failure) [facility])  Hospital Course: Amber Hull is an 89 y.o. female with a history of chronic hypoxic respiratory failure, stage IIIb CKD, chronic HFpEF, atrial fibrillation and anemia who presented to the ED on 01/13/2023 on the advice of cardiology clinic due to respiratory distress with worsened hypoxia in the office, subsequently admitted for acute on chronic hypoxic respiratory failure due to acute HFpEF and AECOPD. With IV lasix, her volume status has been optimized. With antibiotics, steroids, and breathing treatments, her respiratory symptoms have resolved. Please see details below regarding work up.   Assessment and Plan: Acute on chronic hypoxic respiratory failure: Distress in clinic, improved now. BNP 659 which is above her elevated baseline. Troponin negative x3. Has been on O2 for 6 years. - Continue diuresis, aim for lasix 20mg  daily per cardiology recommendations  - Continue steroids  and BDs for AECOPD as below - D-dimer below age-adjusted normal limits. Renal impairment relatively contraindicates CTA chest, V/Q overall low risk for PE within confines of abnormal CXR.  - Mildly elevated eosinophils noted to be dating back at least 5 years ?ABPA. Nondependent crackles on exam and coarse interstitial markings suggest ILD as underlying etiology. CT chest performed here shows fibrotic ILD. ILD protocol chest CT is recommended after resolution of acute symptoms. No focal consolidation noted. There was airway debris, pt prefers to discharge prior to SLP evaluation.   Acute on chronic HFpEF: LVEF 60-65% with rheumatic MV without severe valvular stenosis/insufficiency. Mod elevated PASP, normal RV size and function, dilated IVC. - Diuresed well with lasix 60mg  IV, appears euvolemic, so deescalate per cardiology recommendations to 20mg  daily. F/u 7/23. Down 76.3kg > 73.9kg on day of discharge.      AECOPD: RVP negative. - Continue prednisone 40mg  daily x5 days - Continue azithromycin x5 days - Continue singulair and other home medications including nebs.   Hyperglycemia: HbA1c 5.2%   PAF: TSH wnl at 0.657.  - Continue cardioselective beta blocker and diltiazem - Keep K and Mg replete, continue K supplement in light of ongoing loop diuretic   Iron deficiency anemia, MGUS: Per records, has MGUS, last bone survey negative. Per report, had reassuring GI work up 2019. - Follow up with hematology, Dr. Ellin Saba. - Anemia panel shows no deficiency.   Stage IIIb CKD:  - Followed by Dr. Wolfgang Phoenix. Has follow up in the next week. Continue calcitriol for secondary hyperparathyroidism.  - Monitor Cr closely, stable. - Continue gabapentin qHS at current dose.   GERD:  - PPI.   Consultants: Cardiology  Procedures performed: None  Disposition: Home Diet recommendation:  Cardiac diet DISCHARGE MEDICATION: Allergies as of 01/16/2023       Reactions   Penicillins Rash   Has patient had  a PCN reaction causing immediate rash, facial/tongue/throat swelling, SOB or lightheadedness with hypotension: No Has patient had a PCN reaction causing severe rash involving mucus membranes or skin necrosis: No Has patient had a PCN reaction that required hospitalization: No Has patient had a PCN reaction occurring within the last 10 years: No If all of the above answers are "NO", then may proceed with Cephalosporin use.        Medication List     STOP taking these medications    Potassium 99 MG Tabs       TAKE these medications    albuterol 108 (90 Base) MCG/ACT inhaler Commonly known as: VENTOLIN HFA Inhale into the lungs every 6 (six) hours as needed for wheezing or shortness of breath.   albuterol (2.5 MG/3ML) 0.083% nebulizer solution Commonly known as: PROVENTIL Take 2.5 mg by nebulization every 6 (six) hours as needed for wheezing or shortness of breath.   aspirin EC 81 MG tablet Take 81 mg by mouth daily. Swallow whole.   azithromycin 250 MG tablet Commonly known as: ZITHROMAX Take 1 tablet (250 mg total) by mouth daily for 2 days. Start taking on: January 17, 2023   bisoprolol 5 MG tablet Commonly known as: ZEBETA Take 1 tablet (5 mg total) by mouth daily.   calcitRIOL 0.25 MCG capsule Commonly known as: ROCALTROL Take 0.25 mcg by mouth See admin instructions. Pt takes on Monday and Thurs due to [facility] know longer excepting Doctor who wrote rx.   diltiazem 300 MG 24 hr capsule Commonly known as: CARDIZEM CD Take 1 capsule by mouth once daily   esomeprazole 40 MG capsule Commonly known as: NEXIUM 1 PO 30 MINS PRIOR TO FIRST MEAL   furosemide 20 MG tablet Commonly known as: LASIX Take 1 tablet (20 mg total) by mouth daily. What changed:  medication strength how much to take when to take this reasons to take this   gabapentin 400 MG capsule Commonly known as: NEURONTIN Take 400 mg by mouth at bedtime.   hydrOXYzine 25 MG tablet Commonly known as:  ATARAX Take 25 mg by mouth at bedtime.   ipratropium-albuterol 0.5-2.5 (3) MG/3ML Soln Commonly known as: DUONEB Take 3 mLs by nebulization.   montelukast 10 MG tablet Commonly known as: SINGULAIR Take 10 mg by mouth at bedtime.   OVER THE COUNTER MEDICATION Take 1 capsule by mouth daily. Vitamin A,B12,D3.   potassium chloride SA 20 MEQ tablet Commonly known as: KLOR-CON M Take 1 tablet (20 mEq total) by mouth daily.   predniSONE 20 MG tablet Commonly known as: DELTASONE Take 2 tablets (40 mg total) by mouth daily with breakfast for 2 days. Start taking on: January 17, 2023               Durable Medical Equipment  (From admission, onward)           Start     Ordered   01/15/23 1350  For home use only DME Walker rolling  Once       Question Answer Comment  Walker: With 5 Inch Wheels   Patient needs a walker to treat with the following condition Abnormality of gait and mobility   Patient needs a walker to treat with the following condition Weakness      01/15/23 1349  Follow-up Information     Sharlene Dory, NP Follow up on 02/03/2023.   Specialty: Cardiology Why: Cardiology Hospital Follow-up on 02/03/2023 at 10:00AM. Contact information: [address] [place] [address] [phone]         [facility] Follow up.   Why: Will contact you to schedule home visits for physical therapy. Contact information: [phone]        Donetta Potts, MD Follow up.   Specialty: Internal Medicine Contact information: [address] [place] [address] [phone]                Discharge Exam: Ceasar Mons Weights   01/14/23 0400 01/15/23 0559 01/16/23 0405  Weight: 76.3 kg 73.3 kg 73.9 kg  BP (!) 145/67 (BP Location: Right Arm)   Pulse 84   Temp 97.7 F (36.5 C) (Oral)   Resp 20   Ht 5\' 8"  (1.727 m)   Wt 73.9 kg   SpO2 100%   BMI 24.78 kg/m   Pleasant, very HOH elderly female in normal clothes sitting EOB  requesting discharge.  Nondependent crackles with better global aeration, no wheezes or rhonchi No significant edema  Condition at discharge: stable  The results of significant diagnostics from this hospitalization (including imaging, microbiology, ancillary and laboratory) are listed below for reference.   Imaging Studies: CT CHEST WO CONTRAST  Result Date: 01/15/2023 CLINICAL DATA:  Chronic shortness of breath EXAM: CT CHEST WITHOUT CONTRAST TECHNIQUE: Multidetector CT imaging of the chest was performed following the standard protocol without IV contrast. RADIATION DOSE REDUCTION: This exam was performed according to the departmental dose-optimization program which includes automated exposure control, adjustment of the mA and/or kV according to patient size and/or use of iterative reconstruction technique. COMPARISON:  None Available PACs system at the time of this dictation. FINDINGS: Cardiovascular: Cardiomegaly. No pericardial effusion. Normal caliber thoracic aorta with moderate calcified plaque. Moderate coronary artery calcifications. Dilated distal pulmonary arteries. Mediastinum/Nodes: Small hiatal hernia. Debris seen in the upper esophagus. Thyroid is unremarkable. No enlarged lymph nodes seen in the chest. Lungs/Pleura: Central airways are patent. Elevation of the right hemidiaphragm. Mild mosaic attenuation. Evaluation of the lung parenchyma is somewhat limited due to motion artifact. Scattered debris seen in the left-greater-than-right bilateral lower lobe bronchi. Subpleural and peribronchovascular reticular and ground-glass opacities with no clear craniocaudal predominance, possible slight subpleural predominance. Traction bronchiolectasis. Honeycomb change, most pronounced in the right lower lobe. No pleural effusion. No pneumothorax. Upper Abdomen: Nonobstructing stone of the left kidney. No acute abnormality. Musculoskeletal: No chest wall mass or suspicious bone lesions identified.  IMPRESSION: 1. Scattered debris seen in the left-greater-than-right bilateral lower lobe bronchi and upper esophagus, likely due to aspiration. 2. Fibrotic interstitial lung disease. Evaluation is somewhat limited to respiratory motion artifact and exam technique. Primary differential considerations include usual interstitial pneumonia or fibrotic hypersensitivity pneumonitis. Recommend ILD protocol chest CT after resolution of acute symptoms. 3. Mosaic attenuation of the lungs which can be seen in the setting of pulmonary hypertension or air trapping. 4. Cardiomegaly and aortic Atherosclerosis (ICD10-I70.0). Electronically Signed   By: Allegra Lai M.D.   On: 01/15/2023 16:03   ECHOCARDIOGRAM COMPLETE  Result Date: 01/14/2023    ECHOCARDIOGRAM REPORT   Patient Name:   Amber Hull Date of Exam: 01/14/2023 Medical Rec #:  [id number]            Height:       68.0 in Accession #:    [id number]  Weight:       168.2 lb Date of Birth:  01-23-1932            BSA:          1.899 m Patient Age:    89 years             BP:           147/71 mmHg Patient Gender: F                    HR:           79 bpm. [facility] Procedure: 2D Echo, Cardiac Doppler and Color Doppler Indications:    CHF-Acute Diastolic I50.31  History:        Patient has prior history of Echocardiogram examinations, most                 recent 12/13/2020. CHF, COPD, Mitral Valve Disease;                 Arrythmias:Atrial Fibrillation. Acute respiratory failure with                 hypoxia (HCC).  Sonographer:    Celesta Gentile RCS Referring Phys: [phone] JAMES KIM IMPRESSIONS  1. Left ventricular ejection fraction, by estimation, is 60 to 65%. The left ventricle has normal function. The left ventricle has no regional wall motion abnormalities. Left ventricular diastolic parameters are indeterminate.  2. Right ventricular systolic function is normal. The right ventricular size is normal. There is moderately elevated pulmonary  artery systolic pressure.  3. Left atrial size was severely dilated.  4. Right atrial size was severely dilated.  5. Diastolic doming of anterior MV leaflet suggseting rheumatic valvular disease. Mean gradient 4 mmHg at HR 83 bpm. The mitral valve is abnormal. Trivial mitral valve regurgitation. Mild mitral stenosis. The mean mitral valve gradient is 4.0 mmHg.  6. The tricuspid valve is abnormal.  7. The aortic valve is tricuspid. Aortic valve regurgitation is not visualized. No aortic stenosis is present.  8. The inferior vena cava is dilated in size with >50% respiratory variability, suggesting right atrial pressure of 8 mmHg. FINDINGS  Left Ventricle: Left ventricular ejection fraction, by estimation, is 60 to 65%. The left ventricle has normal function. The left ventricle has no regional wall motion abnormalities. The left ventricular internal cavity size was normal in size. There is  no left ventricular hypertrophy. Left ventricular diastolic parameters are indeterminate. Right Ventricle: The right ventricular size is normal. Right vetricular wall thickness was not well visualized. Right ventricular systolic function is normal. There is moderately elevated pulmonary artery systolic pressure. The tricuspid regurgitant velocity is 3.22 m/s, and with an assumed right atrial pressure of 8 mmHg, the estimated right ventricular systolic pressure is 49.5 mmHg. Left Atrium: Left atrial size was severely dilated. Right Atrium: Right atrial size was severely dilated. Pericardium: There is no evidence of pericardial effusion. Mitral Valve: Diastolic doming of anterior MV leaflet suggseting rheumatic valvular disease. Mean gradient 4 mmHg at HR 83 bpm. The mitral valve is abnormal. There is mild thickening of the mitral valve leaflet(s). There is mild calcification of the mitral valve leaflet(s). Mild mitral annular calcification. Trivial mitral valve regurgitation. Mild mitral valve stenosis. MV peak gradient, 15.5 mmHg.  The mean mitral valve gradient is 4.0 mmHg. Tricuspid Valve: The tricuspid valve is abnormal. Tricuspid valve regurgitation is mild . No evidence of tricuspid stenosis. Aortic Valve: The aortic valve  is tricuspid. Aortic valve regurgitation is not visualized. No aortic stenosis is present. Aortic valve mean gradient measures 4.6 mmHg. Aortic valve peak gradient measures 10.9 mmHg. Aortic valve area, by VTI measures 1.92  cm. Pulmonic Valve: The pulmonic valve was not well visualized. Pulmonic valve regurgitation is not visualized. No evidence of pulmonic stenosis. Aorta: The aortic root is normal in size and structure. Venous: The inferior vena cava is dilated in size with greater than 50% respiratory variability, suggesting right atrial pressure of 8 mmHg. IAS/Shunts: No atrial level shunt detected by color flow Doppler.  LEFT VENTRICLE PLAX 2D LVIDd:         4.30 cm LVIDs:         3.00 cm LV PW:         0.80 cm LV IVS:        1.00 cm LVOT diam:     2.00 cm LV SV:         61 LV SV Index:   32 LVOT Area:     3.14 cm  RIGHT VENTRICLE RV S prime:     8.92 cm/s TAPSE (M-mode): 1.7 cm LEFT ATRIUM              Index        RIGHT ATRIUM           Index LA diam:        4.70 cm  2.48 cm/m   RA Area:     24.60 cm LA Vol (A2C):   93.1 ml  49.03 ml/m  RA Volume:   85.80 ml  45.19 ml/m LA Vol (A4C):   102.0 ml 53.72 ml/m LA Biplane Vol: 98.7 ml  51.98 ml/m  AORTIC VALVE AV Area (Vmax):    1.82 cm AV Area (Vmean):   2.19 cm AV Area (VTI):     1.92 cm AV Vmax:           164.92 cm/s AV Vmean:          96.910 cm/s AV VTI:            0.317 m AV Peak Grad:      10.9 mmHg AV Mean Grad:      4.6 mmHg LVOT Vmax:         95.80 cm/s LVOT Vmean:        67.500 cm/s LVOT VTI:          0.194 m LVOT/AV VTI ratio: 0.61  AORTA Ao Root diam: 3.30 cm MITRAL VALVE                TRICUSPID VALVE MV Area (PHT): 2.87 cm     TR Peak grad:   41.5 mmHg MV Peak grad:  15.5 mmHg    TR Vmax:        322.00 cm/s MV Mean grad:  4.0 mmHg MV Vmax:        1.97 m/s     SHUNTS MV Vmean:      80.9 cm/s    Systemic VTI:  0.19 m MV Decel Time: 264 msec     Systemic Diam: 2.00 cm MV E velocity: 189.00 cm/s Dina Rich MD Electronically signed by Dina Rich MD Signature Date/Time: 01/14/2023/12:31:57 PM    Final    NM Pulmonary Perfusion  Result Date: 01/14/2023 CLINICAL DATA:  Chest pain. EXAM: NUCLEAR MEDICINE PERFUSION LUNG SCAN TECHNIQUE: Perfusion images were obtained in multiple projections after intravenous injection of radiopharmaceutical. Ventilation scans intentionally deferred if perfusion scan  and chest x-ray adequate for interpretation during COVID 19 epidemic. RADIOPHARMACEUTICALS:  4.4 mCi Tc-59m MAA IV COMPARISON:  Chest x-ray 01/13/2023 FINDINGS: On perfusion only images there is some decrease areas of uptake along both lung bases posteriorly. On corresponding chest x-ray there is extensive interstitial changes with some pleural thickening and small effusions. IMPRESSION: Conglomeration of findings suggest low probability V/Q scan Electronically Signed   By: Karen Kays M.D.   On: 01/14/2023 10:07   DG Chest Portable 1 View  Result Date: 01/13/2023 CLINICAL DATA:  Chest pain and shortness of breath EXAM: PORTABLE CHEST 1 VIEW COMPARISON:  10/17/2020 FINDINGS: Once again there is diffuse interstitial changes of the lungs with tiny effusions or pleural thickening. Enlarged cardiopericardial silhouette with a calcified aorta. No pneumothorax. Overlapping cardiac leads. Significant calcifications along the tracheobronchial tree. IMPRESSION: Persistent extensive interstitial changes with pleural thickening or tiny effusions. Enlarged heart. Please correlate for any known history Electronically Signed   By: Karen Kays M.D.   On: 01/13/2023 17:13    Microbiology: Results for orders placed or performed during the hospital encounter of 01/13/23  Respiratory (~20 pathogens) panel by PCR     Status: None   Collection Time: 01/14/23  6:45 PM    Specimen: Nasopharyngeal Swab; Respiratory  Result Value Ref Range Status   Adenovirus NOT DETECTED NOT DETECTED Final   Coronavirus 229E NOT DETECTED NOT DETECTED Final    Comment: (NOTE) The Coronavirus on the Respiratory Panel, DOES NOT test for the novel  Coronavirus (2019 nCoV)    Coronavirus HKU1 NOT DETECTED NOT DETECTED Final   Coronavirus NL63 NOT DETECTED NOT DETECTED Final   Coronavirus OC43 NOT DETECTED NOT DETECTED Final   Metapneumovirus NOT DETECTED NOT DETECTED Final   Rhinovirus / Enterovirus NOT DETECTED NOT DETECTED Final   Influenza A NOT DETECTED NOT DETECTED Final   Influenza B NOT DETECTED NOT DETECTED Final   Parainfluenza Virus 1 NOT DETECTED NOT DETECTED Final   Parainfluenza Virus 2 NOT DETECTED NOT DETECTED Final   Parainfluenza Virus 3 NOT DETECTED NOT DETECTED Final   Parainfluenza Virus 4 NOT DETECTED NOT DETECTED Final   Respiratory Syncytial Virus NOT DETECTED NOT DETECTED Final   Bordetella pertussis NOT DETECTED NOT DETECTED Final   Bordetella Parapertussis NOT DETECTED NOT DETECTED Final   Chlamydophila pneumoniae NOT DETECTED NOT DETECTED Final   Mycoplasma pneumoniae NOT DETECTED NOT DETECTED Final    Comment: Performed at [facility], [address], [place], [place] [address]    Labs: CBC: Recent Labs  Lab 01/13/23 1747 01/14/23 0436 01/15/23 0443  WBC 9.1 4.1 16.1*  NEUTROABS 5.6  --   --   HGB 11.5* 9.9* 10.0*  HCT 36.8 30.6* 31.3*  MCV 94.1 90.5 91.0  PLT 244 213 251   Basic Metabolic Panel: Recent Labs  Lab 01/13/23 1747 01/14/23 0436 01/15/23 0443 01/16/23 0505  NA 136 135 134* 133*  K 4.6 3.5 4.6 3.8  CL 97* 94* 92* 92*  CO2 29 31 32 31  GLUCOSE 92 247* 168* 114*  BUN 18 22 35* 54*  CREATININE 1.46* 1.50* 1.53* 1.87*  CALCIUM 9.4 9.1 9.4 9.1  MG  --   --  1.8 1.9   Liver Function Tests: Recent Labs  Lab 01/14/23 0436  AST 21  ALT 12  ALKPHOS 113  BILITOT 0.7  PROT 7.8  ALBUMIN 3.4*   CBG: Recent  Labs  Lab 01/15/23 1113 01/15/23 1617 01/15/23 2121 01/16/23 0739 01/16/23 1118  GLUCAP 186* 187* 181* 149* 159*    Discharge time spent: greater than 30 minutes.  Signed: Tyrone Nine, MD [facility] 01/16/2023

## 2023-01-16 NOTE — Progress Notes (Addendum)
Rounding Note    Patient Name: Amber Hull Date of Encounter: 01/16/2023  [facility] Cardiologist: Dina Rich, MD   Subjective   Feels great this morning. Dressed and ready to go home later this morning. Denies any chest pain or palpitations. Breathing back to baseline.   Inpatient Medications    Scheduled Meds:  albuterol  2.5 mg Nebulization BID   aspirin EC  81 mg Oral Daily   azithromycin  250 mg Oral Daily   bisoprolol  5 mg Oral Daily   diltiazem  300 mg Oral Daily   enoxaparin (LOVENOX) injection  30 mg Subcutaneous Q24H   gabapentin  200 mg Oral TID   insulin aspart  0-5 Units Subcutaneous QHS   insulin aspart  0-9 Units Subcutaneous TID WC   montelukast  10 mg Oral QHS   nitroGLYCERIN  0.5 inch Topical Q8H   pantoprazole  40 mg Oral Daily   potassium chloride SA  20 mEq Oral Daily   predniSONE  40 mg Oral Q breakfast   umeclidinium bromide  1 puff Inhalation Daily    PRN Meds: acetaminophen **OR** acetaminophen, albuterol, benzonatate, hydrALAZINE, ondansetron **OR** ondansetron (ZOFRAN) IV   Vital Signs    Vitals:   01/16/23 0403 01/16/23 0405 01/16/23 0725 01/16/23 0736  BP: 134/70     Pulse: 83     Resp: 17     Temp: 97.7 F (36.5 C)     TempSrc: Oral     SpO2: 99%  98% 98%  Weight:  73.9 kg    Height:        Intake/Output Summary (Last 24 hours) at 01/16/2023 0755 Last data filed at 01/15/2023 1700 Gross per 24 hour  Intake 840 ml  Output 2600 ml  Net -1760 ml      01/16/2023    4:05 AM 01/15/2023    5:59 AM 01/14/2023    4:00 AM  Last 3 Weights  Weight (lbs) 163 lb 161 lb 8 oz 168 lb 3.4 oz  Weight (kg) 73.936 kg 73.256 kg 76.3 kg      Telemetry    Atrial fibrillation, HR in 70's to 80's. Occasional PVC's.  - Personally Reviewed  ECG    No new tracings.   Physical Exam   GEN: Pleasant, elderly female appearing in no acute distress.  Hard of hearing.  Neck: No JVD Cardiac: Irregularly irregular. 2/6  systolic murmur along Apex.  Respiratory: Scattered rhonchi. No wheezing or rales.  GI: Soft, nontender, non-distended  MS: No pitting edema; No deformity. Neuro:  Nonfocal  Psych: Normal affect   Labs    High Sensitivity Troponin:   Recent Labs  Lab 01/13/23 1747 01/13/23 1957 01/13/23 2209  TROPONINIHS 8 8 8      Chemistry Recent Labs  Lab 01/14/23 0436 01/15/23 0443 01/16/23 0505  NA 135 134* 133*  K 3.5 4.6 3.8  CL 94* 92* 92*  CO2 31 32 31  GLUCOSE 247* 168* 114*  BUN 22 35* 54*  CREATININE 1.50* 1.53* 1.87*  CALCIUM 9.1 9.4 9.1  MG  --  1.8 1.9  PROT 7.8  --   --   ALBUMIN 3.4*  --   --   AST 21  --   --   ALT 12  --   --   ALKPHOS 113  --   --   BILITOT 0.7  --   --   GFRNONAA 33* 32* 25*  ANIONGAP 10 10 10  Hematology Recent Labs  Lab 01/13/23 1747 01/14/23 0436 01/15/23 0443  WBC 9.1 4.1 16.1*  RBC 3.91 3.38* 3.44*  3.42*  HGB 11.5* 9.9* 10.0*  HCT 36.8 30.6* 31.3*  MCV 94.1 90.5 91.0  MCH 29.4 29.3 29.1  MCHC 31.3 32.4 31.9  RDW 14.0 13.8 14.2  PLT 244 213 251   Thyroid  Recent Labs  Lab 01/14/23 0436  TSH 0.657    BNP Recent Labs  Lab 01/13/23 1747  BNP 659.0*    DDimer  Recent Labs  Lab 01/13/23 1747  DDIMER 0.59*     Radiology    CT CHEST WO CONTRAST  Result Date: 01/15/2023 CLINICAL DATA:  Chronic shortness of breath EXAM: CT CHEST WITHOUT CONTRAST TECHNIQUE: Multidetector CT imaging of the chest was performed following the standard protocol without IV contrast. RADIATION DOSE REDUCTION: This exam was performed according to the departmental dose-optimization program which includes automated exposure control, adjustment of the mA and/or kV according to patient size and/or use of iterative reconstruction technique. COMPARISON:  None Available PACs system at the time of this dictation. FINDINGS: Cardiovascular: Cardiomegaly. No pericardial effusion. Normal caliber thoracic aorta with moderate calcified plaque. Moderate  coronary artery calcifications. Dilated distal pulmonary arteries. Mediastinum/Nodes: Small hiatal hernia. Debris seen in the upper esophagus. Thyroid is unremarkable. No enlarged lymph nodes seen in the chest. Lungs/Pleura: Central airways are patent. Elevation of the right hemidiaphragm. Mild mosaic attenuation. Evaluation of the lung parenchyma is somewhat limited due to motion artifact. Scattered debris seen in the left-greater-than-right bilateral lower lobe bronchi. Subpleural and peribronchovascular reticular and ground-glass opacities with no clear craniocaudal predominance, possible slight subpleural predominance. Traction bronchiolectasis. Honeycomb change, most pronounced in the right lower lobe. No pleural effusion. No pneumothorax. Upper Abdomen: Nonobstructing stone of the left kidney. No acute abnormality. Musculoskeletal: No chest wall mass or suspicious bone lesions identified. IMPRESSION: 1. Scattered debris seen in the left-greater-than-right bilateral lower lobe bronchi and upper esophagus, likely due to aspiration. 2. Fibrotic interstitial lung disease. Evaluation is somewhat limited to respiratory motion artifact and exam technique. Primary differential considerations include usual interstitial pneumonia or fibrotic hypersensitivity pneumonitis. Recommend ILD protocol chest CT after resolution of acute symptoms. 3. Mosaic attenuation of the lungs which can be seen in the setting of pulmonary hypertension or air trapping. 4. Cardiomegaly and aortic Atherosclerosis (ICD10-I70.0). Electronically Signed   By: Allegra Lai M.D.   On: 01/15/2023 16:03    Cardiac Studies   Echocardiogram: 01/14/2023 IMPRESSIONS    1. Left ventricular ejection fraction, by estimation, is 60 to 65%. The  left ventricle has normal function. The left ventricle has no regional  wall motion abnormalities. Left ventricular diastolic parameters are  indeterminate.   2. Right ventricular systolic function is  normal. The right ventricular  size is normal. There is moderately elevated pulmonary artery systolic  pressure.   3. Left atrial size was severely dilated.   4. Right atrial size was severely dilated.   5. Diastolic doming of anterior MV leaflet suggseting rheumatic valvular  disease. Mean gradient 4 mmHg at HR 83 bpm. The mitral valve is abnormal.  Trivial mitral valve regurgitation. Mild mitral stenosis. The mean mitral  valve gradient is 4.0 mmHg.   6. The tricuspid valve is abnormal.   7. The aortic valve is tricuspid. Aortic valve regurgitation is not  visualized. No aortic stenosis is present.   8. The inferior vena cava is dilated in size with >50% respiratory  variability, suggesting right atrial pressure of  8 mmHg.   Patient Profile     89 y.o. female w/ PMH of  paroxysmal atrial fibrillation, chronic HFpEF, mitral stenosis, chronic hypoxic respiratory failure and Stage 3 CKD who is currently admitted for acute hypoxic respiratory failure in the setting of acute CHF and COPD exacerbations.   Assessment & Plan    1. Acute HFpEF - BNP elevated at 659 on admission and CXR showed interstitial changes with pleural thickening or possible effusions. D-dimer was elevated but VQ scan was low-probability for a PE.  - Repeat echocardiogram this admission shows a preserved EF of 60 to 65% with no wall motion abnormalities and she was noted to have normal RV function but moderately elevated PASP. Suspect this is secondary to underlying ILD. - She has been receiving IV Lasix 60mg  daily with a recorded net output of -3.1 L and weight has declined by 5 lbs to 163 lbs. Given her rise in creatinine and worsening hyponatremia, agree with holding additional IV Lasix today. She was taking 40mg  PRN at home and would likely benefit from at least a standing dose of PO Lasix 20mg  daily at discharge. Will arrange for outpatient Cardiology follow-up in [place].    2. Acute on Chronic Hypoxic Respiratory  Failure - Felt to be secondary to an acute CHF exacerbation and COPD exacerbation. She has been diuresed as discussed above. Remains on antibiotics, steroids and scheduled nebulizers for COPD exacerbation which is being managed by the admitting team. - Chest CT yesterday also showed scattered debris along the bilateral bronchi and upper esophagus which was likely due to aspiration and also noted to have fibrotic interstitial lung disease. She is followed by Pulmonology in [place] as an outpatient.   3. Persistent Atrial Fibrillation - Rates are well-controlled. Continue Cardizem CD 300mg  daily and Bisoprolol 5mg  daily for rate-control.  -By review of notes, she has not been on anticoagulation given her anemia and MGUS. Hemoglobin stable at 10.0. Would defer to her primary cardiologist in regards to evaluation for Watchman device placement but given her age and comorbidities, she is likely not an ideal candidate for this.   4. NSVT - Resolved. No recurrence overnight or this morning. Mg at 1.9 today and K+ at 3.8 today.    5. Valvular Heart Disease - Echo in 2022 showed her mitral valve appeared rheumatic with moderate MS and moderate MR. Repeat echo this admission shows only trivial MR and mild MS. Continue to follow as an outpatient.    6. Stage 3 CKD - Baseline creatinine 1.4 - 1.5. Trending up to 1.87 today. Agree with holding additional diuretics.    For questions or updates, please contact [facility] Please consult [email or web address] for contact info under        Signed, Ellsworth Lennox, PA-C  01/16/2023, 7:55 AM      Attending note:  Patient seen and examined, chart reviewed.  I agree with above assessment by Ms. Strader PA-C.  This [place] feels much better and would like to go home.  Heart rate 80s to 90s in atrial fibrillation on telemetry, systolic 130s.  Net urine output of 1700 cc last 24 hours.  Pertinent lab work includes potassium 3.8, BUN 54, creatinine 1.87  which is an upward trend.  Hemoglobin stable at 10.  Plan discharge on aspirin, bisoprolol, Cardizem CD, and Lasix 20 mg daily for now.  Will arrange office follow-up in [facility] for further adjustments in therapy.  Jonelle Sidle, M.D., F.A.C.C.

## 2023-01-16 NOTE — Plan of Care (Signed)
  Problem: Education: Goal: Ability to demonstrate management of disease process will improve Outcome: Progressing Goal: Ability to verbalize understanding of medication therapies will improve Outcome: Progressing Goal: Individualized Educational Video(s) Outcome: Progressing   Problem: Cardiac: Goal: Ability to achieve and maintain adequate cardiopulmonary perfusion will improve Outcome: Progressing   Problem: Education: Goal: Knowledge of General Education information will improve Description: Including pain rating scale, medication(s)/side effects and non-pharmacologic comfort measures Outcome: Progressing   Problem: Clinical Measurements: Goal: Ability to maintain clinical measurements within normal limits will improve Outcome: Progressing Goal: Will remain free from infection Outcome: Progressing Goal: Diagnostic test results will improve Outcome: Progressing Goal: Respiratory complications will improve Outcome: Progressing Goal: Cardiovascular complication will be avoided Outcome: Progressing   Problem: Activity: Goal: Risk for activity intolerance will decrease Outcome: Progressing   Problem: Nutrition: Goal: Adequate nutrition will be maintained Outcome: Progressing   Problem: Coping: Goal: Level of anxiety will decrease Outcome: Progressing   Problem: Elimination: Goal: Will not experience complications related to bowel motility Outcome: Progressing Goal: Will not experience complications related to urinary retention Outcome: Progressing   Problem: Pain Managment: Goal: General experience of comfort will improve Outcome: Progressing   Problem: Safety: Goal: Ability to remain free from injury will improve Outcome: Progressing   Problem: Skin Integrity: Goal: Risk for impaired skin integrity will decrease Outcome: Progressing   Problem: Education: Goal: Ability to describe self-care measures that may prevent or decrease complications (Diabetes  Survival Skills Education) will improve Outcome: Progressing Goal: Individualized Educational Video(s) Outcome: Progressing   Problem: Coping: Goal: Ability to adjust to condition or change in health will improve Outcome: Progressing   Problem: Fluid Volume: Goal: Ability to maintain a balanced intake and output will improve Outcome: Progressing   Problem: Health Behavior/Discharge Planning: Goal: Ability to identify and utilize available resources and services will improve Outcome: Progressing Goal: Ability to manage health-related needs will improve Outcome: Progressing   Problem: Nutritional: Goal: Maintenance of adequate nutrition will improve Outcome: Progressing Goal: Progress toward achieving an optimal weight will improve Outcome: Progressing   Problem: Metabolic: Goal: Ability to maintain appropriate glucose levels will improve Outcome: Progressing   Problem: Skin Integrity: Goal: Risk for impaired skin integrity will decrease Outcome: Progressing   Problem: Tissue Perfusion: Goal: Adequacy of tissue perfusion will improve Outcome: Progressing

## 2023-01-23 ENCOUNTER — Encounter: Payer: Self-pay | Admitting: Internal Medicine

## 2023-02-01 DIAGNOSIS — J449 Chronic obstructive pulmonary disease, unspecified: Secondary | ICD-10-CM | POA: Diagnosis not present

## 2023-02-03 ENCOUNTER — Ambulatory Visit: Payer: Medicare PPO | Attending: Nurse Practitioner | Admitting: Nurse Practitioner

## 2023-02-03 ENCOUNTER — Encounter: Payer: Self-pay | Admitting: Nurse Practitioner

## 2023-02-03 VITALS — BP 138/70 | HR 77 | Ht 68.0 in | Wt 161.8 lb

## 2023-02-03 DIAGNOSIS — I38 Endocarditis, valve unspecified: Secondary | ICD-10-CM

## 2023-02-03 DIAGNOSIS — I5032 Chronic diastolic (congestive) heart failure: Secondary | ICD-10-CM

## 2023-02-03 DIAGNOSIS — J449 Chronic obstructive pulmonary disease, unspecified: Secondary | ICD-10-CM

## 2023-02-03 DIAGNOSIS — N1832 Chronic kidney disease, stage 3b: Secondary | ICD-10-CM | POA: Diagnosis not present

## 2023-02-03 DIAGNOSIS — I4891 Unspecified atrial fibrillation: Secondary | ICD-10-CM | POA: Diagnosis not present

## 2023-02-03 DIAGNOSIS — I4729 Other ventricular tachycardia: Secondary | ICD-10-CM | POA: Diagnosis not present

## 2023-02-03 DIAGNOSIS — J849 Interstitial pulmonary disease, unspecified: Secondary | ICD-10-CM

## 2023-02-03 DIAGNOSIS — Z79899 Other long term (current) drug therapy: Secondary | ICD-10-CM

## 2023-02-03 NOTE — Progress Notes (Unsigned)
Cardiology Office Note:  .   Date:  02/03/2023 ID:  Amber Hull, DOB 01-20-1932, MRN [id number] PCP: Donetta Potts, MD  [facility] Providers Cardiologist:  Dina Rich, MD    History of Present Illness: .   Amber Hull is a 89 y.o. female with a PMH of chest pain, A-fib, chronic diastolic CHF, chronic respiratory failure (on chronic O2)/ COPD, HTN, moderate mitral valve stenosis, CKD stage 3b and chronic anemia (followed by Dr. Wolfgang Phoenix), MGUS, and GERD, who presents today for 1 month follow-up.   Last seen by Dr. Dina Rich on December 18, 2022. Pt noted some recent palpitations, and showed signs of fluid overload. She also reported some atypical symptoms of chest pain. Metoprolol was switched to bisoprolol at 5 mg daily and was instructed to take Lasix 40 mg daily x 4 days, then return back to PRN dosing.   I last saw her for 1 month follow-up with her friend, Lupita Leash on January 13, 2023. Pt is a poor historian due to [facility]. Admitted to chest pain and shortness of breath and was found to be in respiratory distress, was sent to the ED for further evaluation.   Admitted at [facility] for acute respiratory failure with hypoxia. BNP minimally elevated. Trops negative. D-dimer below age-adjusted normal limits. V/Q scan showed overall low risk of PE. Workup showed to suggest ILD as underlying etiology. CT of chest showed fibrotic ILD. Cardiology consulted. TTE EF 60-65% with rheumatic MV without severe valvular stenosis/insufficiency, diuresed well. Tx for AECOPD.   Today she presents for hospital follow-up. Doing much better and feels better. Breathing is back to baseline. Denies any chest pain, worsening shortness of breath, palpitations, syncope, presyncope, dizziness, orthopnea, PND, swelling or significant weight changes, acute bleeding, or claudication.  Studies Reviewed: Marland Kitchen    EKG is not ordered today.   Echo 01/2023:  1. Left ventricular ejection fraction, by estimation,  is 60 to 65%. The  left ventricle has normal function. The left ventricle has no regional  wall motion abnormalities. Left ventricular diastolic parameters are  indeterminate.   2. Right ventricular systolic function is normal. The right ventricular  size is normal. There is moderately elevated pulmonary artery systolic  pressure.   3. Left atrial size was severely dilated.   4. Right atrial size was severely dilated.   5. Diastolic doming of anterior MV leaflet suggseting rheumatic valvular  disease. Mean gradient 4 mmHg at HR 83 bpm. The mitral valve is abnormal.  Trivial mitral valve regurgitation. Mild mitral stenosis. The mean mitral  valve gradient is 4.0 mmHg.   6. The tricuspid valve is abnormal.   7. The aortic valve is tricuspid. Aortic valve regurgitation is not  visualized. No aortic stenosis is present.   8. The inferior vena cava is dilated in size with >50% respiratory  variability, suggesting right atrial pressure of 8 mmHg.    Risk Assessment/Calculations:    CHA2DS2-VASc Score = 5  } This indicates a 7.2% annual risk of stroke. The patient's score is based upon: CHF History: 1 HTN History: 1 Diabetes History: 0 Stroke History: 0 Vascular Disease History: 0 Age Score: 2 Gender Score: 1  Physical Exam:   VS:  BP 138/70   Pulse 77   Ht 5\' 8"  (1.727 m)   Wt 161 lb 12.8 oz (73.4 kg)   SpO2 94%   BMI 24.60 kg/m    Wt Readings from Last 3 Encounters:  02/03/23 161 lb 12.8 oz (73.4  kg)  01/16/23 163 lb (73.9 kg)  01/13/23 163 lb (73.9 kg)    GEN: Thin 89 y.o. female in no acute distress, wearing chronic oxygen NECK: No JVD; No carotid bruits CARDIAC: S1/S2, irregularly irregular, Grade 1/6 murmur, no rubs, no gallops RESPIRATORY: Overall clear and diminished without wheezing, rales, or rhonchi  ABDOMEN: Soft, non-tender, non-distended EXTREMITIES:  No edema; No deformity   ASSESSMENT AND PLAN: .    Chronic HFpEF, medication management Stage C, NYHA  class II symptoms. EF 60-65%. Euvolemic and well compensated on exam.  Continue bisoprolol, Lasix, and potassium supplementation. Low sodium diet, fluid restriction <2L, and daily weights encouraged. Educated to contact our office for weight gain of 2 lbs overnight or 5 lbs in one week. At next visit, plan to initiate Aldactone if kidney function and BP permits. Obtaining labs as mentioned below.   A-fib, NSVT Denies any tachycardia or palpitations.  During hospitalization she was noted to have an isolated 8 beat run of NSVT on telemetry. No recurrence noted. Continue current medication regimen. Has remained off OAC d/t anemia and MGUS, discussed risks vs benefits of Watchman device placement and came to shared medical decision with patient who has declined at this time. Will obtain CBC and BMET.   Valvular heart disease TTE in 2022 revealed that her mitral valve appeared rheumatic with moderate MR and MS, however repeat TTE this past admission revealed findings suggestive of rheumatic valvular disease with mild mitral valve stenosis and trivial mitral valve regurgitation. Plan to repeat Echo in 1 year for monitoring.   COPD, ILD? Was admitted for acute on chronic respiratory failure, felt to be secondary d/t CHF and COPD exacerbation. CT chest performed during admission showed findings suggestive of fibrotic ILD. Continue to follow-up with PCP and Pulmonology.   5. CKD stage 3b Most recent labs revealed sCr at 1.87 with eGFR at 25. Avoid nephrotoxic agents. No medication changes at this time. Will obtain BMET as mentioned above. Continue to follow-up with Dr. Wolfgang Phoenix.   Dispo: Follow-up in 3-4 months with me or APP or sooner if anything changes.    Signed, Sharlene Dory, NP

## 2023-02-03 NOTE — Patient Instructions (Addendum)
Medication Instructions:  Your physician recommends that you continue on your current medications as directed. Please refer to the Current Medication list given to you today.  Labwork: BMET and CBC at [facility]   Testing/Procedures: none  Follow-Up: Your physician recommends that you schedule a follow-up appointment in: 3-4 Months with Philis Nettle   Any Other Special Instructions Will Be Listed Below (If Applicable).  If you need a refill on your cardiac medications before your next appointment, please call your pharmacy.

## 2023-02-04 DIAGNOSIS — N184 Chronic kidney disease, stage 4 (severe): Secondary | ICD-10-CM | POA: Diagnosis not present

## 2023-02-04 DIAGNOSIS — D472 Monoclonal gammopathy: Secondary | ICD-10-CM | POA: Diagnosis not present

## 2023-02-04 DIAGNOSIS — D638 Anemia in other chronic diseases classified elsewhere: Secondary | ICD-10-CM | POA: Diagnosis not present

## 2023-02-04 DIAGNOSIS — N2581 Secondary hyperparathyroidism of renal origin: Secondary | ICD-10-CM | POA: Diagnosis not present

## 2023-03-04 DIAGNOSIS — J449 Chronic obstructive pulmonary disease, unspecified: Secondary | ICD-10-CM | POA: Diagnosis not present

## 2023-04-04 DIAGNOSIS — J449 Chronic obstructive pulmonary disease, unspecified: Secondary | ICD-10-CM | POA: Diagnosis not present

## 2023-04-12 ENCOUNTER — Other Ambulatory Visit: Payer: Self-pay | Admitting: Cardiology

## 2023-05-04 DIAGNOSIS — J449 Chronic obstructive pulmonary disease, unspecified: Secondary | ICD-10-CM | POA: Diagnosis not present

## 2023-05-11 ENCOUNTER — Ambulatory Visit: Payer: Medicare PPO | Admitting: Nurse Practitioner

## 2023-05-11 NOTE — Progress Notes (Deleted)
Cardiology Office Note:  .   Date:  02/03/2023 ID:  Amber Hull, DOB October 29, 1931, MRN [id number] PCP: Donetta Potts, MD  [facility] Providers Cardiologist:  Dina Rich, MD    History of Present Illness: .   Amber Hull is a 89 y.o. female with a PMH of chest pain, A-fib, chronic diastolic CHF, chronic respiratory failure (on chronic O2)/ COPD, HTN, moderate mitral valve stenosis, CKD stage 3b and chronic anemia (followed by Dr. Wolfgang Phoenix), MGUS, and GERD, who presents today for 1 month follow-up.   Last seen by Dr. Dina Rich on December 18, 2022. Pt noted some recent palpitations, and showed signs of fluid overload. She also reported some atypical symptoms of chest pain. Metoprolol was switched to bisoprolol at 5 mg daily and was instructed to take Lasix 40 mg daily x 4 days, then return back to PRN dosing.   I last saw her for 1 month follow-up with her friend, Lupita Leash on January 13, 2023. Pt is a poor historian due to [facility]. Admitted to chest pain and shortness of breath and was found to be in respiratory distress, was sent to the ED for further evaluation.   [facility]mitted at [facility] for acute respiratory failure with hypoxia. BNP minimally elevated. Trops negative. D-dimer below age-adjusted normal limits. V/Q scan showed overall low risk of PE. Workup showed to suggest ILD as underlying etiology. CT of chest showed fibrotic ILD. Cardiology consulted. TTE EF 60-65% with rheumatic MV without severe valvular stenosis/insufficiency, diuresed well. Tx for AECOPD.   Today she presents for hospital follow-up. Doing much better and feels better. Breathing is back to baseline. Denies any chest pain, worsening shortness of breath, palpitations, syncope, presyncope, dizziness, orthopnea, PND, swelling or significant weight changes, acute bleeding, or claudication.  Studies Reviewed: Marland Kitchen    EKG is not ordered today.   Echo 01/2023:  1. Left ventricular ejection fraction, by estimation,  is 60 to 65%. The  left ventricle has normal function. The left ventricle has no regional  wall motion abnormalities. Left ventricular diastolic parameters are  indeterminate.   2. Right ventricular systolic function is normal. The right ventricular  size is normal. There is moderately elevated pulmonary artery systolic  pressure.   3. Left atrial size was severely dilated.   4. Right atrial size was severely dilated.   5. Diastolic doming of anterior MV leaflet suggseting rheumatic valvular  disease. Mean gradient 4 mmHg at HR 83 bpm. The mitral valve is abnormal.  Trivial mitral valve regurgitation. Mild mitral stenosis. The mean mitral  valve gradient is 4.0 mmHg.   6. The tricuspid valve is abnormal.   7. The aortic valve is tricuspid. Aortic valve regurgitation is not  visualized. No aortic stenosis is present.   8. The inferior vena cava is dilated in size with >50% respiratory  variability, suggesting right atrial pressure of 8 mmHg.    Risk Assessment/Calculations:    CHA2DS2-VASc Score = 5  } This indicates a 7.2% annual risk of stroke. The patient's score is based upon: CHF History: 1 HTN History: 1 Diabetes History: 0 Stroke History: 0 Vascular Disease History: 0 Age Score: 2 Gender Score: 1  Physical Exam:   VS:  There were no vitals taken for this visit.   Wt Readings from Last 3 Encounters:  02/03/23 161 lb 12.8 oz (73.4 kg)  01/16/23 163 lb (73.9 kg)  01/13/23 163 lb (73.9 kg)    GEN: Thin 89 y.o. female in no acute  distress, wearing chronic oxygen NECK: No JVD; No carotid bruits CARDIAC: S1/S2, irregularly irregular, Grade 1/6 murmur, no rubs, no gallops RESPIRATORY: Overall clear and diminished without wheezing, rales, or rhonchi  ABDOMEN: Soft, non-tender, non-distended EXTREMITIES:  No edema; No deformity   ASSESSMENT AND PLAN: .    Chronic HFpEF, medication management Stage C, NYHA class II symptoms. EF 60-65%. Euvolemic and well compensated on  exam.  Continue bisoprolol, Lasix, and potassium supplementation. Low sodium diet, fluid restriction <2L, and daily weights encouraged. Educated to contact our office for weight gain of 2 lbs overnight or 5 lbs in one week. At next visit, plan to initiate Aldactone if kidney function and BP permits. Obtaining labs as mentioned below.   A-fib, NSVT Denies any tachycardia or palpitations.  During hospitalization she was noted to have an isolated 8 beat run of NSVT on telemetry. No recurrence noted. Continue current medication regimen. Has remained off OAC d/t anemia and MGUS, discussed risks vs benefits of Watchman device placement and came to shared medical decision with patient who has declined at this time. Will obtain CBC and BMET.   Valvular heart disease TTE in 2022 revealed that her mitral valve appeared rheumatic with moderate MR and MS, however repeat TTE this past admission revealed findings suggestive of rheumatic valvular disease with mild mitral valve stenosis and trivial mitral valve regurgitation. Plan to repeat Echo in 1 year for monitoring.   COPD, ILD? Was admitted for acute on chronic respiratory failure, felt to be secondary d/t CHF and COPD exacerbation. CT chest performed during admission showed findings suggestive of fibrotic ILD. Continue to follow-up with PCP and Pulmonology.   5. CKD stage 3b Most recent labs revealed sCr at 1.87 with eGFR at 25. Avoid nephrotoxic agents. No medication changes at this time. Will obtain BMET as mentioned above. Continue to follow-up with Dr. Wolfgang Phoenix.   Dispo: Follow-up in 3-4 months with me or APP or sooner if anything changes.    Signed, Sharlene Dory, NP

## 2023-05-20 DIAGNOSIS — J449 Chronic obstructive pulmonary disease, unspecified: Secondary | ICD-10-CM | POA: Diagnosis not present

## 2023-05-20 DIAGNOSIS — I10 Essential (primary) hypertension: Secondary | ICD-10-CM | POA: Diagnosis not present

## 2023-05-20 DIAGNOSIS — N184 Chronic kidney disease, stage 4 (severe): Secondary | ICD-10-CM | POA: Diagnosis not present

## 2023-05-20 DIAGNOSIS — N2581 Secondary hyperparathyroidism of renal origin: Secondary | ICD-10-CM | POA: Diagnosis not present

## 2023-06-04 DIAGNOSIS — J449 Chronic obstructive pulmonary disease, unspecified: Secondary | ICD-10-CM | POA: Diagnosis not present

## 2023-06-27 ENCOUNTER — Other Ambulatory Visit: Payer: Self-pay | Admitting: Cardiology

## 2023-07-04 DIAGNOSIS — J449 Chronic obstructive pulmonary disease, unspecified: Secondary | ICD-10-CM | POA: Diagnosis not present

## 2023-07-10 DIAGNOSIS — R0600 Dyspnea, unspecified: Secondary | ICD-10-CM | POA: Diagnosis not present

## 2023-07-10 DIAGNOSIS — D649 Anemia, unspecified: Secondary | ICD-10-CM | POA: Diagnosis not present

## 2023-07-10 DIAGNOSIS — R300 Dysuria: Secondary | ICD-10-CM | POA: Diagnosis not present

## 2023-07-10 DIAGNOSIS — I050 Rheumatic mitral stenosis: Secondary | ICD-10-CM | POA: Diagnosis not present

## 2023-07-10 DIAGNOSIS — R0689 Other abnormalities of breathing: Secondary | ICD-10-CM | POA: Diagnosis not present

## 2023-07-10 DIAGNOSIS — J8410 Pulmonary fibrosis, unspecified: Secondary | ICD-10-CM | POA: Diagnosis not present

## 2023-07-10 DIAGNOSIS — I4891 Unspecified atrial fibrillation: Secondary | ICD-10-CM | POA: Diagnosis not present

## 2023-07-10 DIAGNOSIS — I272 Pulmonary hypertension, unspecified: Secondary | ICD-10-CM | POA: Diagnosis not present

## 2023-07-10 DIAGNOSIS — G8929 Other chronic pain: Secondary | ICD-10-CM | POA: Diagnosis not present

## 2023-07-10 DIAGNOSIS — Z20822 Contact with and (suspected) exposure to covid-19: Secondary | ICD-10-CM | POA: Diagnosis not present

## 2023-07-10 DIAGNOSIS — R0602 Shortness of breath: Secondary | ICD-10-CM | POA: Diagnosis not present

## 2023-07-10 DIAGNOSIS — J811 Chronic pulmonary edema: Secondary | ICD-10-CM | POA: Diagnosis not present

## 2023-07-10 DIAGNOSIS — J449 Chronic obstructive pulmonary disease, unspecified: Secondary | ICD-10-CM | POA: Diagnosis not present

## 2023-07-10 DIAGNOSIS — I509 Heart failure, unspecified: Secondary | ICD-10-CM | POA: Diagnosis not present

## 2023-07-10 DIAGNOSIS — I10 Essential (primary) hypertension: Secondary | ICD-10-CM | POA: Diagnosis not present

## 2023-07-10 DIAGNOSIS — D509 Iron deficiency anemia, unspecified: Secondary | ICD-10-CM | POA: Diagnosis not present

## 2023-07-10 DIAGNOSIS — K219 Gastro-esophageal reflux disease without esophagitis: Secondary | ICD-10-CM | POA: Diagnosis not present

## 2023-07-10 DIAGNOSIS — I129 Hypertensive chronic kidney disease with stage 1 through stage 4 chronic kidney disease, or unspecified chronic kidney disease: Secondary | ICD-10-CM | POA: Diagnosis not present

## 2023-07-10 DIAGNOSIS — N2581 Secondary hyperparathyroidism of renal origin: Secondary | ICD-10-CM | POA: Diagnosis not present

## 2023-07-10 DIAGNOSIS — Z9981 Dependence on supplemental oxygen: Secondary | ICD-10-CM | POA: Diagnosis not present

## 2023-07-10 DIAGNOSIS — F0390 Unspecified dementia without behavioral disturbance: Secondary | ICD-10-CM | POA: Diagnosis not present

## 2023-07-10 DIAGNOSIS — E211 Secondary hyperparathyroidism, not elsewhere classified: Secondary | ICD-10-CM | POA: Diagnosis not present

## 2023-07-10 DIAGNOSIS — I4811 Longstanding persistent atrial fibrillation: Secondary | ICD-10-CM | POA: Diagnosis not present

## 2023-07-10 DIAGNOSIS — I5043 Acute on chronic combined systolic (congestive) and diastolic (congestive) heart failure: Secondary | ICD-10-CM | POA: Diagnosis not present

## 2023-07-10 DIAGNOSIS — R0902 Hypoxemia: Secondary | ICD-10-CM | POA: Diagnosis not present

## 2023-07-10 DIAGNOSIS — R9389 Abnormal findings on diagnostic imaging of other specified body structures: Secondary | ICD-10-CM | POA: Diagnosis not present

## 2023-07-10 DIAGNOSIS — I130 Hypertensive heart and chronic kidney disease with heart failure and stage 1 through stage 4 chronic kidney disease, or unspecified chronic kidney disease: Secondary | ICD-10-CM | POA: Diagnosis not present

## 2023-07-10 DIAGNOSIS — R109 Unspecified abdominal pain: Secondary | ICD-10-CM | POA: Diagnosis not present

## 2023-07-10 DIAGNOSIS — R0989 Other specified symptoms and signs involving the circulatory and respiratory systems: Secondary | ICD-10-CM | POA: Diagnosis not present

## 2023-07-10 DIAGNOSIS — N184 Chronic kidney disease, stage 4 (severe): Secondary | ICD-10-CM | POA: Diagnosis not present

## 2023-07-10 DIAGNOSIS — J9621 Acute and chronic respiratory failure with hypoxia: Secondary | ICD-10-CM | POA: Diagnosis not present

## 2023-07-10 DIAGNOSIS — I5041 Acute combined systolic (congestive) and diastolic (congestive) heart failure: Secondary | ICD-10-CM | POA: Diagnosis not present

## 2023-07-10 DIAGNOSIS — R069 Unspecified abnormalities of breathing: Secondary | ICD-10-CM | POA: Diagnosis not present

## 2023-07-10 DIAGNOSIS — D631 Anemia in chronic kidney disease: Secondary | ICD-10-CM | POA: Diagnosis not present

## 2023-07-10 DIAGNOSIS — R531 Weakness: Secondary | ICD-10-CM | POA: Diagnosis not present

## 2023-07-11 DIAGNOSIS — R0600 Dyspnea, unspecified: Secondary | ICD-10-CM | POA: Diagnosis not present

## 2023-07-11 DIAGNOSIS — I10 Essential (primary) hypertension: Secondary | ICD-10-CM | POA: Diagnosis not present

## 2023-07-11 DIAGNOSIS — J811 Chronic pulmonary edema: Secondary | ICD-10-CM | POA: Diagnosis not present

## 2023-07-11 DIAGNOSIS — J449 Chronic obstructive pulmonary disease, unspecified: Secondary | ICD-10-CM | POA: Diagnosis not present

## 2023-07-11 DIAGNOSIS — I4811 Longstanding persistent atrial fibrillation: Secondary | ICD-10-CM | POA: Diagnosis not present

## 2023-07-11 DIAGNOSIS — R0989 Other specified symptoms and signs involving the circulatory and respiratory systems: Secondary | ICD-10-CM | POA: Diagnosis not present

## 2023-07-11 DIAGNOSIS — I5041 Acute combined systolic (congestive) and diastolic (congestive) heart failure: Secondary | ICD-10-CM | POA: Diagnosis not present

## 2023-07-11 DIAGNOSIS — N2581 Secondary hyperparathyroidism of renal origin: Secondary | ICD-10-CM | POA: Diagnosis not present

## 2023-07-11 DIAGNOSIS — N184 Chronic kidney disease, stage 4 (severe): Secondary | ICD-10-CM | POA: Diagnosis not present

## 2023-07-11 DIAGNOSIS — K219 Gastro-esophageal reflux disease without esophagitis: Secondary | ICD-10-CM | POA: Diagnosis not present

## 2023-07-12 DIAGNOSIS — I129 Hypertensive chronic kidney disease with stage 1 through stage 4 chronic kidney disease, or unspecified chronic kidney disease: Secondary | ICD-10-CM | POA: Diagnosis not present

## 2023-07-12 DIAGNOSIS — E211 Secondary hyperparathyroidism, not elsewhere classified: Secondary | ICD-10-CM | POA: Diagnosis not present

## 2023-07-12 DIAGNOSIS — D631 Anemia in chronic kidney disease: Secondary | ICD-10-CM | POA: Diagnosis not present

## 2023-07-12 DIAGNOSIS — I509 Heart failure, unspecified: Secondary | ICD-10-CM | POA: Diagnosis not present

## 2023-07-12 DIAGNOSIS — J9621 Acute and chronic respiratory failure with hypoxia: Secondary | ICD-10-CM | POA: Diagnosis not present

## 2023-07-12 DIAGNOSIS — K219 Gastro-esophageal reflux disease without esophagitis: Secondary | ICD-10-CM | POA: Diagnosis not present

## 2023-07-12 DIAGNOSIS — N184 Chronic kidney disease, stage 4 (severe): Secondary | ICD-10-CM | POA: Diagnosis not present

## 2023-07-12 DIAGNOSIS — I4891 Unspecified atrial fibrillation: Secondary | ICD-10-CM | POA: Diagnosis not present

## 2023-07-12 DIAGNOSIS — J449 Chronic obstructive pulmonary disease, unspecified: Secondary | ICD-10-CM | POA: Diagnosis not present

## 2023-07-13 DIAGNOSIS — I130 Hypertensive heart and chronic kidney disease with heart failure and stage 1 through stage 4 chronic kidney disease, or unspecified chronic kidney disease: Secondary | ICD-10-CM | POA: Diagnosis not present

## 2023-07-13 DIAGNOSIS — R109 Unspecified abdominal pain: Secondary | ICD-10-CM | POA: Diagnosis not present

## 2023-07-13 DIAGNOSIS — N184 Chronic kidney disease, stage 4 (severe): Secondary | ICD-10-CM | POA: Diagnosis not present

## 2023-07-13 DIAGNOSIS — J449 Chronic obstructive pulmonary disease, unspecified: Secondary | ICD-10-CM | POA: Diagnosis not present

## 2023-07-13 DIAGNOSIS — R300 Dysuria: Secondary | ICD-10-CM | POA: Diagnosis not present

## 2023-07-13 DIAGNOSIS — J9621 Acute and chronic respiratory failure with hypoxia: Secondary | ICD-10-CM | POA: Diagnosis not present

## 2023-07-13 DIAGNOSIS — D631 Anemia in chronic kidney disease: Secondary | ICD-10-CM | POA: Diagnosis not present

## 2023-07-13 DIAGNOSIS — I4891 Unspecified atrial fibrillation: Secondary | ICD-10-CM | POA: Diagnosis not present

## 2023-07-13 DIAGNOSIS — K219 Gastro-esophageal reflux disease without esophagitis: Secondary | ICD-10-CM | POA: Diagnosis not present

## 2023-07-13 DIAGNOSIS — I509 Heart failure, unspecified: Secondary | ICD-10-CM | POA: Diagnosis not present

## 2023-07-15 DIAGNOSIS — I509 Heart failure, unspecified: Secondary | ICD-10-CM | POA: Diagnosis not present

## 2023-07-15 DIAGNOSIS — R9389 Abnormal findings on diagnostic imaging of other specified body structures: Secondary | ICD-10-CM | POA: Diagnosis not present

## 2023-07-15 DIAGNOSIS — R0600 Dyspnea, unspecified: Secondary | ICD-10-CM | POA: Diagnosis not present

## 2023-07-15 DIAGNOSIS — R0989 Other specified symptoms and signs involving the circulatory and respiratory systems: Secondary | ICD-10-CM | POA: Diagnosis not present

## 2023-07-16 DIAGNOSIS — J449 Chronic obstructive pulmonary disease, unspecified: Secondary | ICD-10-CM | POA: Diagnosis not present

## 2023-07-16 DIAGNOSIS — G8929 Other chronic pain: Secondary | ICD-10-CM | POA: Diagnosis not present

## 2023-07-16 DIAGNOSIS — R300 Dysuria: Secondary | ICD-10-CM | POA: Diagnosis not present

## 2023-07-16 DIAGNOSIS — I4891 Unspecified atrial fibrillation: Secondary | ICD-10-CM | POA: Diagnosis not present

## 2023-07-16 DIAGNOSIS — I509 Heart failure, unspecified: Secondary | ICD-10-CM | POA: Diagnosis not present

## 2023-07-16 DIAGNOSIS — J410 Simple chronic bronchitis: Secondary | ICD-10-CM | POA: Diagnosis not present

## 2023-07-16 DIAGNOSIS — J9611 Chronic respiratory failure with hypoxia: Secondary | ICD-10-CM | POA: Diagnosis not present

## 2023-07-16 DIAGNOSIS — J9621 Acute and chronic respiratory failure with hypoxia: Secondary | ICD-10-CM | POA: Diagnosis not present

## 2023-07-16 DIAGNOSIS — K219 Gastro-esophageal reflux disease without esophagitis: Secondary | ICD-10-CM | POA: Diagnosis not present

## 2023-07-16 DIAGNOSIS — I10 Essential (primary) hypertension: Secondary | ICD-10-CM | POA: Diagnosis not present

## 2023-07-16 DIAGNOSIS — I5032 Chronic diastolic (congestive) heart failure: Secondary | ICD-10-CM | POA: Diagnosis not present

## 2023-07-16 DIAGNOSIS — I482 Chronic atrial fibrillation, unspecified: Secondary | ICD-10-CM | POA: Diagnosis not present

## 2023-07-16 DIAGNOSIS — I5043 Acute on chronic combined systolic (congestive) and diastolic (congestive) heart failure: Secondary | ICD-10-CM | POA: Diagnosis not present

## 2023-07-16 DIAGNOSIS — N184 Chronic kidney disease, stage 4 (severe): Secondary | ICD-10-CM | POA: Diagnosis not present

## 2023-07-16 DIAGNOSIS — Z9981 Dependence on supplemental oxygen: Secondary | ICD-10-CM | POA: Diagnosis not present

## 2023-07-16 DIAGNOSIS — R531 Weakness: Secondary | ICD-10-CM | POA: Diagnosis not present

## 2023-07-16 DIAGNOSIS — J8410 Pulmonary fibrosis, unspecified: Secondary | ICD-10-CM | POA: Diagnosis not present

## 2023-07-17 DIAGNOSIS — J9611 Chronic respiratory failure with hypoxia: Secondary | ICD-10-CM | POA: Diagnosis not present

## 2023-07-17 DIAGNOSIS — I5032 Chronic diastolic (congestive) heart failure: Secondary | ICD-10-CM | POA: Diagnosis not present

## 2023-07-17 DIAGNOSIS — J410 Simple chronic bronchitis: Secondary | ICD-10-CM | POA: Diagnosis not present

## 2023-07-17 DIAGNOSIS — I509 Heart failure, unspecified: Secondary | ICD-10-CM | POA: Diagnosis not present

## 2023-07-17 DIAGNOSIS — I482 Chronic atrial fibrillation, unspecified: Secondary | ICD-10-CM | POA: Diagnosis not present

## 2023-07-17 DIAGNOSIS — I10 Essential (primary) hypertension: Secondary | ICD-10-CM | POA: Diagnosis not present

## 2023-08-04 DIAGNOSIS — J449 Chronic obstructive pulmonary disease, unspecified: Secondary | ICD-10-CM | POA: Diagnosis not present

## 2023-08-06 DIAGNOSIS — J8410 Pulmonary fibrosis, unspecified: Secondary | ICD-10-CM | POA: Diagnosis not present

## 2023-08-06 DIAGNOSIS — I5043 Acute on chronic combined systolic (congestive) and diastolic (congestive) heart failure: Secondary | ICD-10-CM | POA: Diagnosis not present

## 2023-08-06 DIAGNOSIS — N184 Chronic kidney disease, stage 4 (severe): Secondary | ICD-10-CM | POA: Diagnosis not present

## 2023-08-06 DIAGNOSIS — I4821 Permanent atrial fibrillation: Secondary | ICD-10-CM | POA: Diagnosis not present

## 2023-08-06 DIAGNOSIS — J9611 Chronic respiratory failure with hypoxia: Secondary | ICD-10-CM | POA: Diagnosis not present

## 2023-08-06 DIAGNOSIS — I130 Hypertensive heart and chronic kidney disease with heart failure and stage 1 through stage 4 chronic kidney disease, or unspecified chronic kidney disease: Secondary | ICD-10-CM | POA: Diagnosis not present

## 2023-08-11 DIAGNOSIS — J9611 Chronic respiratory failure with hypoxia: Secondary | ICD-10-CM | POA: Diagnosis not present

## 2023-08-11 DIAGNOSIS — I130 Hypertensive heart and chronic kidney disease with heart failure and stage 1 through stage 4 chronic kidney disease, or unspecified chronic kidney disease: Secondary | ICD-10-CM | POA: Diagnosis not present

## 2023-08-11 DIAGNOSIS — I5043 Acute on chronic combined systolic (congestive) and diastolic (congestive) heart failure: Secondary | ICD-10-CM | POA: Diagnosis not present

## 2023-08-11 DIAGNOSIS — I4821 Permanent atrial fibrillation: Secondary | ICD-10-CM | POA: Diagnosis not present

## 2023-08-11 DIAGNOSIS — J8410 Pulmonary fibrosis, unspecified: Secondary | ICD-10-CM | POA: Diagnosis not present

## 2023-08-11 DIAGNOSIS — N184 Chronic kidney disease, stage 4 (severe): Secondary | ICD-10-CM | POA: Diagnosis not present

## 2023-08-12 DIAGNOSIS — J9611 Chronic respiratory failure with hypoxia: Secondary | ICD-10-CM | POA: Diagnosis not present

## 2023-08-12 DIAGNOSIS — I5043 Acute on chronic combined systolic (congestive) and diastolic (congestive) heart failure: Secondary | ICD-10-CM | POA: Diagnosis not present

## 2023-08-12 DIAGNOSIS — N184 Chronic kidney disease, stage 4 (severe): Secondary | ICD-10-CM | POA: Diagnosis not present

## 2023-08-12 DIAGNOSIS — D631 Anemia in chronic kidney disease: Secondary | ICD-10-CM | POA: Diagnosis not present

## 2023-08-12 DIAGNOSIS — J441 Chronic obstructive pulmonary disease with (acute) exacerbation: Secondary | ICD-10-CM | POA: Diagnosis not present

## 2023-08-12 DIAGNOSIS — F331 Major depressive disorder, recurrent, moderate: Secondary | ICD-10-CM | POA: Diagnosis not present

## 2023-08-12 DIAGNOSIS — I4821 Permanent atrial fibrillation: Secondary | ICD-10-CM | POA: Diagnosis not present

## 2023-08-12 DIAGNOSIS — I130 Hypertensive heart and chronic kidney disease with heart failure and stage 1 through stage 4 chronic kidney disease, or unspecified chronic kidney disease: Secondary | ICD-10-CM | POA: Diagnosis not present

## 2023-08-12 DIAGNOSIS — J8410 Pulmonary fibrosis, unspecified: Secondary | ICD-10-CM | POA: Diagnosis not present

## 2023-08-21 DIAGNOSIS — J8410 Pulmonary fibrosis, unspecified: Secondary | ICD-10-CM | POA: Diagnosis not present

## 2023-08-21 DIAGNOSIS — J9611 Chronic respiratory failure with hypoxia: Secondary | ICD-10-CM | POA: Diagnosis not present

## 2023-08-21 DIAGNOSIS — N184 Chronic kidney disease, stage 4 (severe): Secondary | ICD-10-CM | POA: Diagnosis not present

## 2023-08-21 DIAGNOSIS — I130 Hypertensive heart and chronic kidney disease with heart failure and stage 1 through stage 4 chronic kidney disease, or unspecified chronic kidney disease: Secondary | ICD-10-CM | POA: Diagnosis not present

## 2023-08-21 DIAGNOSIS — I5043 Acute on chronic combined systolic (congestive) and diastolic (congestive) heart failure: Secondary | ICD-10-CM | POA: Diagnosis not present

## 2023-08-21 DIAGNOSIS — I4821 Permanent atrial fibrillation: Secondary | ICD-10-CM | POA: Diagnosis not present

## 2023-08-28 DIAGNOSIS — J8410 Pulmonary fibrosis, unspecified: Secondary | ICD-10-CM | POA: Diagnosis not present

## 2023-08-28 DIAGNOSIS — I130 Hypertensive heart and chronic kidney disease with heart failure and stage 1 through stage 4 chronic kidney disease, or unspecified chronic kidney disease: Secondary | ICD-10-CM | POA: Diagnosis not present

## 2023-08-28 DIAGNOSIS — J9611 Chronic respiratory failure with hypoxia: Secondary | ICD-10-CM | POA: Diagnosis not present

## 2023-08-28 DIAGNOSIS — I4821 Permanent atrial fibrillation: Secondary | ICD-10-CM | POA: Diagnosis not present

## 2023-08-28 DIAGNOSIS — I5043 Acute on chronic combined systolic (congestive) and diastolic (congestive) heart failure: Secondary | ICD-10-CM | POA: Diagnosis not present

## 2023-08-28 DIAGNOSIS — N184 Chronic kidney disease, stage 4 (severe): Secondary | ICD-10-CM | POA: Diagnosis not present

## 2023-08-31 DIAGNOSIS — I4821 Permanent atrial fibrillation: Secondary | ICD-10-CM | POA: Diagnosis not present

## 2023-08-31 DIAGNOSIS — J9611 Chronic respiratory failure with hypoxia: Secondary | ICD-10-CM | POA: Diagnosis not present

## 2023-08-31 DIAGNOSIS — I130 Hypertensive heart and chronic kidney disease with heart failure and stage 1 through stage 4 chronic kidney disease, or unspecified chronic kidney disease: Secondary | ICD-10-CM | POA: Diagnosis not present

## 2023-08-31 DIAGNOSIS — J8410 Pulmonary fibrosis, unspecified: Secondary | ICD-10-CM | POA: Diagnosis not present

## 2023-08-31 DIAGNOSIS — I5043 Acute on chronic combined systolic (congestive) and diastolic (congestive) heart failure: Secondary | ICD-10-CM | POA: Diagnosis not present

## 2023-08-31 DIAGNOSIS — N184 Chronic kidney disease, stage 4 (severe): Secondary | ICD-10-CM | POA: Diagnosis not present

## 2023-09-04 DIAGNOSIS — J449 Chronic obstructive pulmonary disease, unspecified: Secondary | ICD-10-CM | POA: Diagnosis not present

## 2023-09-09 DIAGNOSIS — I4821 Permanent atrial fibrillation: Secondary | ICD-10-CM | POA: Diagnosis not present

## 2023-09-09 DIAGNOSIS — J8410 Pulmonary fibrosis, unspecified: Secondary | ICD-10-CM | POA: Diagnosis not present

## 2023-09-09 DIAGNOSIS — J9611 Chronic respiratory failure with hypoxia: Secondary | ICD-10-CM | POA: Diagnosis not present

## 2023-09-09 DIAGNOSIS — I130 Hypertensive heart and chronic kidney disease with heart failure and stage 1 through stage 4 chronic kidney disease, or unspecified chronic kidney disease: Secondary | ICD-10-CM | POA: Diagnosis not present

## 2023-09-09 DIAGNOSIS — I5043 Acute on chronic combined systolic (congestive) and diastolic (congestive) heart failure: Secondary | ICD-10-CM | POA: Diagnosis not present

## 2023-09-09 DIAGNOSIS — N184 Chronic kidney disease, stage 4 (severe): Secondary | ICD-10-CM | POA: Diagnosis not present

## 2023-10-02 DIAGNOSIS — J449 Chronic obstructive pulmonary disease, unspecified: Secondary | ICD-10-CM | POA: Diagnosis not present

## 2023-11-02 DIAGNOSIS — J449 Chronic obstructive pulmonary disease, unspecified: Secondary | ICD-10-CM | POA: Diagnosis not present

## 2023-11-11 ENCOUNTER — Other Ambulatory Visit: Payer: Self-pay | Admitting: Cardiology

## 2023-11-19 ENCOUNTER — Encounter: Payer: Self-pay | Admitting: Nurse Practitioner

## 2023-11-19 ENCOUNTER — Ambulatory Visit: Admitting: Nurse Practitioner

## 2023-12-02 DIAGNOSIS — J449 Chronic obstructive pulmonary disease, unspecified: Secondary | ICD-10-CM | POA: Diagnosis not present

## 2023-12-17 ENCOUNTER — Other Ambulatory Visit: Payer: Self-pay | Admitting: Cardiology

## 2024-01-02 DIAGNOSIS — J449 Chronic obstructive pulmonary disease, unspecified: Secondary | ICD-10-CM | POA: Diagnosis not present

## 2024-01-17 ENCOUNTER — Other Ambulatory Visit: Payer: Self-pay | Admitting: Cardiology

## 2024-02-01 DIAGNOSIS — J449 Chronic obstructive pulmonary disease, unspecified: Secondary | ICD-10-CM | POA: Diagnosis not present

## 2024-02-06 DIAGNOSIS — J449 Chronic obstructive pulmonary disease, unspecified: Secondary | ICD-10-CM | POA: Diagnosis not present

## 2024-02-06 DIAGNOSIS — R0600 Dyspnea, unspecified: Secondary | ICD-10-CM | POA: Diagnosis not present

## 2024-02-06 DIAGNOSIS — J9621 Acute and chronic respiratory failure with hypoxia: Secondary | ICD-10-CM | POA: Diagnosis not present

## 2024-02-06 DIAGNOSIS — J8410 Pulmonary fibrosis, unspecified: Secondary | ICD-10-CM | POA: Diagnosis not present

## 2024-02-06 DIAGNOSIS — R069 Unspecified abnormalities of breathing: Secondary | ICD-10-CM | POA: Diagnosis not present

## 2024-02-06 DIAGNOSIS — Z9981 Dependence on supplemental oxygen: Secondary | ICD-10-CM | POA: Diagnosis not present

## 2024-02-06 DIAGNOSIS — I4891 Unspecified atrial fibrillation: Secondary | ICD-10-CM | POA: Diagnosis not present

## 2024-02-06 DIAGNOSIS — I509 Heart failure, unspecified: Secondary | ICD-10-CM | POA: Diagnosis not present

## 2024-02-06 DIAGNOSIS — I272 Pulmonary hypertension, unspecified: Secondary | ICD-10-CM | POA: Diagnosis not present

## 2024-02-06 DIAGNOSIS — N1832 Chronic kidney disease, stage 3b: Secondary | ICD-10-CM | POA: Diagnosis not present

## 2024-02-06 DIAGNOSIS — I130 Hypertensive heart and chronic kidney disease with heart failure and stage 1 through stage 4 chronic kidney disease, or unspecified chronic kidney disease: Secondary | ICD-10-CM | POA: Diagnosis not present

## 2024-02-06 DIAGNOSIS — N184 Chronic kidney disease, stage 4 (severe): Secondary | ICD-10-CM | POA: Diagnosis not present

## 2024-02-06 DIAGNOSIS — I5033 Acute on chronic diastolic (congestive) heart failure: Secondary | ICD-10-CM | POA: Diagnosis not present

## 2024-02-06 DIAGNOSIS — K219 Gastro-esophageal reflux disease without esophagitis: Secondary | ICD-10-CM | POA: Diagnosis not present

## 2024-02-06 DIAGNOSIS — Z79899 Other long term (current) drug therapy: Secondary | ICD-10-CM | POA: Diagnosis not present

## 2024-02-06 DIAGNOSIS — J9612 Chronic respiratory failure with hypercapnia: Secondary | ICD-10-CM | POA: Diagnosis not present

## 2024-02-06 DIAGNOSIS — I371 Nonrheumatic pulmonary valve insufficiency: Secondary | ICD-10-CM | POA: Diagnosis not present

## 2024-02-06 DIAGNOSIS — J9610 Chronic respiratory failure, unspecified whether with hypoxia or hypercapnia: Secondary | ICD-10-CM | POA: Diagnosis not present

## 2024-02-06 DIAGNOSIS — J849 Interstitial pulmonary disease, unspecified: Secondary | ICD-10-CM | POA: Diagnosis not present

## 2024-02-06 DIAGNOSIS — R0602 Shortness of breath: Secondary | ICD-10-CM | POA: Diagnosis not present

## 2024-02-06 DIAGNOSIS — G928 Other toxic encephalopathy: Secondary | ICD-10-CM | POA: Diagnosis not present

## 2024-02-06 DIAGNOSIS — I340 Nonrheumatic mitral (valve) insufficiency: Secondary | ICD-10-CM | POA: Diagnosis not present

## 2024-02-06 DIAGNOSIS — R918 Other nonspecific abnormal finding of lung field: Secondary | ICD-10-CM | POA: Diagnosis not present

## 2024-02-06 DIAGNOSIS — R0603 Acute respiratory distress: Secondary | ICD-10-CM | POA: Diagnosis not present

## 2024-02-06 DIAGNOSIS — G9341 Metabolic encephalopathy: Secondary | ICD-10-CM | POA: Diagnosis not present

## 2024-02-06 DIAGNOSIS — Z7901 Long term (current) use of anticoagulants: Secondary | ICD-10-CM | POA: Diagnosis not present

## 2024-02-06 DIAGNOSIS — J9622 Acute and chronic respiratory failure with hypercapnia: Secondary | ICD-10-CM | POA: Diagnosis not present

## 2024-02-06 DIAGNOSIS — G9389 Other specified disorders of brain: Secondary | ICD-10-CM | POA: Diagnosis not present

## 2024-02-06 DIAGNOSIS — D72829 Elevated white blood cell count, unspecified: Secondary | ICD-10-CM | POA: Diagnosis not present

## 2024-02-06 DIAGNOSIS — J9611 Chronic respiratory failure with hypoxia: Secondary | ICD-10-CM | POA: Diagnosis not present

## 2024-02-06 DIAGNOSIS — I351 Nonrheumatic aortic (valve) insufficiency: Secondary | ICD-10-CM | POA: Diagnosis not present

## 2024-02-06 DIAGNOSIS — I480 Paroxysmal atrial fibrillation: Secondary | ICD-10-CM | POA: Diagnosis not present

## 2024-02-06 DIAGNOSIS — I071 Rheumatic tricuspid insufficiency: Secondary | ICD-10-CM | POA: Diagnosis not present

## 2024-02-11 DIAGNOSIS — R1110 Vomiting, unspecified: Secondary | ICD-10-CM | POA: Diagnosis not present

## 2024-02-11 DIAGNOSIS — Z9049 Acquired absence of other specified parts of digestive tract: Secondary | ICD-10-CM | POA: Diagnosis not present

## 2024-02-11 DIAGNOSIS — N184 Chronic kidney disease, stage 4 (severe): Secondary | ICD-10-CM | POA: Diagnosis not present

## 2024-02-11 DIAGNOSIS — J9611 Chronic respiratory failure with hypoxia: Secondary | ICD-10-CM | POA: Diagnosis not present

## 2024-02-11 DIAGNOSIS — I5023 Acute on chronic systolic (congestive) heart failure: Secondary | ICD-10-CM | POA: Diagnosis not present

## 2024-02-11 DIAGNOSIS — D72829 Elevated white blood cell count, unspecified: Secondary | ICD-10-CM | POA: Diagnosis not present

## 2024-02-11 DIAGNOSIS — J9612 Chronic respiratory failure with hypercapnia: Secondary | ICD-10-CM | POA: Diagnosis not present

## 2024-02-11 DIAGNOSIS — N1832 Chronic kidney disease, stage 3b: Secondary | ICD-10-CM | POA: Diagnosis not present

## 2024-02-11 DIAGNOSIS — M549 Dorsalgia, unspecified: Secondary | ICD-10-CM | POA: Diagnosis not present

## 2024-02-11 DIAGNOSIS — Z7401 Bed confinement status: Secondary | ICD-10-CM | POA: Diagnosis not present

## 2024-02-11 DIAGNOSIS — R0602 Shortness of breath: Secondary | ICD-10-CM | POA: Diagnosis not present

## 2024-02-11 DIAGNOSIS — I5033 Acute on chronic diastolic (congestive) heart failure: Secondary | ICD-10-CM | POA: Diagnosis not present

## 2024-02-11 DIAGNOSIS — R54 Age-related physical debility: Secondary | ICD-10-CM | POA: Diagnosis not present

## 2024-02-11 DIAGNOSIS — R1084 Generalized abdominal pain: Secondary | ICD-10-CM | POA: Diagnosis not present

## 2024-02-11 DIAGNOSIS — J410 Simple chronic bronchitis: Secondary | ICD-10-CM | POA: Diagnosis not present

## 2024-02-11 DIAGNOSIS — I4891 Unspecified atrial fibrillation: Secondary | ICD-10-CM | POA: Diagnosis not present

## 2024-02-11 DIAGNOSIS — R064 Hyperventilation: Secondary | ICD-10-CM | POA: Diagnosis not present

## 2024-02-11 DIAGNOSIS — I4821 Permanent atrial fibrillation: Secondary | ICD-10-CM | POA: Diagnosis not present

## 2024-02-11 DIAGNOSIS — Z9981 Dependence on supplemental oxygen: Secondary | ICD-10-CM | POA: Diagnosis not present

## 2024-02-11 DIAGNOSIS — K219 Gastro-esophageal reflux disease without esophagitis: Secondary | ICD-10-CM | POA: Diagnosis not present

## 2024-02-11 DIAGNOSIS — R1033 Periumbilical pain: Secondary | ICD-10-CM | POA: Diagnosis not present

## 2024-02-11 DIAGNOSIS — I5032 Chronic diastolic (congestive) heart failure: Secondary | ICD-10-CM | POA: Diagnosis not present

## 2024-02-11 DIAGNOSIS — N3000 Acute cystitis without hematuria: Secondary | ICD-10-CM | POA: Diagnosis not present

## 2024-02-11 DIAGNOSIS — Z79899 Other long term (current) drug therapy: Secondary | ICD-10-CM | POA: Diagnosis not present

## 2024-02-11 DIAGNOSIS — R531 Weakness: Secondary | ICD-10-CM | POA: Diagnosis not present

## 2024-02-11 DIAGNOSIS — J449 Chronic obstructive pulmonary disease, unspecified: Secondary | ICD-10-CM | POA: Diagnosis not present

## 2024-02-11 DIAGNOSIS — J8410 Pulmonary fibrosis, unspecified: Secondary | ICD-10-CM | POA: Diagnosis not present

## 2024-02-11 DIAGNOSIS — N200 Calculus of kidney: Secondary | ICD-10-CM | POA: Diagnosis not present

## 2024-02-11 DIAGNOSIS — R110 Nausea: Secondary | ICD-10-CM | POA: Diagnosis not present

## 2024-02-11 DIAGNOSIS — K6389 Other specified diseases of intestine: Secondary | ICD-10-CM | POA: Diagnosis not present

## 2024-02-11 DIAGNOSIS — R10811 Right upper quadrant abdominal tenderness: Secondary | ICD-10-CM | POA: Diagnosis not present

## 2024-02-11 DIAGNOSIS — R10815 Periumbilic abdominal tenderness: Secondary | ICD-10-CM | POA: Diagnosis not present

## 2024-02-12 DIAGNOSIS — J9611 Chronic respiratory failure with hypoxia: Secondary | ICD-10-CM | POA: Diagnosis not present

## 2024-02-12 DIAGNOSIS — J410 Simple chronic bronchitis: Secondary | ICD-10-CM | POA: Diagnosis not present

## 2024-02-12 DIAGNOSIS — I5032 Chronic diastolic (congestive) heart failure: Secondary | ICD-10-CM | POA: Diagnosis not present

## 2024-02-15 ENCOUNTER — Other Ambulatory Visit: Payer: Self-pay | Admitting: Cardiology

## 2024-02-23 ENCOUNTER — Ambulatory Visit: Attending: Nurse Practitioner | Admitting: Nurse Practitioner

## 2024-02-23 ENCOUNTER — Encounter: Payer: Self-pay | Admitting: Nurse Practitioner

## 2024-02-23 VITALS — BP 116/52 | HR 77 | Wt 120.0 lb

## 2024-02-23 DIAGNOSIS — R10815 Periumbilic abdominal tenderness: Secondary | ICD-10-CM | POA: Diagnosis not present

## 2024-02-23 DIAGNOSIS — R1084 Generalized abdominal pain: Secondary | ICD-10-CM | POA: Diagnosis not present

## 2024-02-23 DIAGNOSIS — I5032 Chronic diastolic (congestive) heart failure: Secondary | ICD-10-CM | POA: Diagnosis not present

## 2024-02-23 DIAGNOSIS — R110 Nausea: Secondary | ICD-10-CM | POA: Diagnosis not present

## 2024-02-23 DIAGNOSIS — N200 Calculus of kidney: Secondary | ICD-10-CM | POA: Diagnosis not present

## 2024-02-23 DIAGNOSIS — I4891 Unspecified atrial fibrillation: Secondary | ICD-10-CM | POA: Diagnosis not present

## 2024-02-23 DIAGNOSIS — K6389 Other specified diseases of intestine: Secondary | ICD-10-CM | POA: Diagnosis not present

## 2024-02-23 DIAGNOSIS — R064 Hyperventilation: Secondary | ICD-10-CM | POA: Diagnosis not present

## 2024-02-23 DIAGNOSIS — R10811 Right upper quadrant abdominal tenderness: Secondary | ICD-10-CM | POA: Diagnosis not present

## 2024-02-23 DIAGNOSIS — J449 Chronic obstructive pulmonary disease, unspecified: Secondary | ICD-10-CM | POA: Diagnosis not present

## 2024-02-23 DIAGNOSIS — N3000 Acute cystitis without hematuria: Secondary | ICD-10-CM | POA: Diagnosis not present

## 2024-02-23 DIAGNOSIS — Z9049 Acquired absence of other specified parts of digestive tract: Secondary | ICD-10-CM | POA: Diagnosis not present

## 2024-02-23 DIAGNOSIS — I4821 Permanent atrial fibrillation: Secondary | ICD-10-CM | POA: Diagnosis not present

## 2024-02-23 DIAGNOSIS — M549 Dorsalgia, unspecified: Secondary | ICD-10-CM | POA: Diagnosis not present

## 2024-02-23 DIAGNOSIS — R0602 Shortness of breath: Secondary | ICD-10-CM

## 2024-02-23 DIAGNOSIS — R1110 Vomiting, unspecified: Secondary | ICD-10-CM | POA: Diagnosis not present

## 2024-02-23 DIAGNOSIS — N184 Chronic kidney disease, stage 4 (severe): Secondary | ICD-10-CM | POA: Diagnosis not present

## 2024-02-23 DIAGNOSIS — R1033 Periumbilical pain: Secondary | ICD-10-CM | POA: Diagnosis not present

## 2024-02-23 NOTE — Patient Instructions (Addendum)
 Medication Instructions:  Your physician recommends that you continue on your current medications as directed. Please refer to the Current Medication list given to you today.  Labwork: None   Testing/Procedures: None   Follow-Up: Your physician recommends that you schedule a follow-up appointment in: 1-2 weeks after discharge   Any Other Special Instructions Will Be Listed Below (If Applicable).  If you need a refill on your cardiac medications before your next appointment, please call your pharmacy.

## 2024-02-23 NOTE — ED Provider Notes (Signed)
 Emergency Department Provider Note  The history of  ED Clinical Impression   Final diagnoses:  Abdominal pain, unspecified abdominal location (Primary)  Back pain, unspecified back location, unspecified back pain laterality, unspecified chronicity    ED Assessment/Plan    History   Chief Complaint  Patient presents with  . Abdominal Pain  . Back Pain   This 89 year old female with a history of COPD on supple CHF atrial fibrillation and stage IV kidney disease presents by ambulance from the cardiologist office today because of severe abdominal and back pain.  She had presented with tachypnea and grunting respirations that was not thought to be cardiac related.According to her daughter she has had increasing abdominal and back pain for the past 2 weeks.  She has vomited twice in that period of time.  She has been followed for atrial fibrillation.    Past Medical History[1]  Past Surgical History[2]  Family History[3]  Social History[4]  Review of Systems  Reason unable to perform ROS: Difficult for the patient to participate because she is hard of hearing, and in significant pain.  Constitutional:  Positive for activity change and appetite change. Negative for fever.  Respiratory:  Positive for shortness of breath (Chronic).   Neurological:  Negative for syncope.    Physical Exam   BP 100/84   Pulse 69   Temp 36.6 C (97.8 F) (Temporal)   Resp 18   Ht 165.1 cm (5' 5)   Wt 54.4 kg (120 lb)   SpO2 99%   BMI 19.97 kg/m   Physical Exam Vitals and nursing note reviewed.  Constitutional:      General: She is in acute distress (Moderate).  HENT:     Head: Normocephalic and atraumatic.  Cardiovascular:     Rate and Rhythm: Normal rate and regular rhythm.  Pulmonary:     Effort: Respiratory distress (Tachypnea with grunting sounds) present.  Abdominal:     Palpations: Abdomen is soft.     Tenderness: There is abdominal tenderness in the right upper quadrant and  periumbilical area.  Skin:    General: Skin is warm and dry.  Neurological:     General: No focal deficit present.     Mental Status: She is alert.     ED Course       Medical Decision Making The patient presented with severe and persistent abdominal pain that radiates to the back with a background of CHF and COPD and previous cholecystectomy.  Concerns for ACS bowel obstruction aortic dissection pancreatitis colitis.  CBC showed a mildly elevated white count of 11,000.                [1] Past Medical History: Diagnosis Date  . A-fib       . Acute on chronic diastolic heart failure     04/05/2020  . Acute respiratory distress 04/05/2020  . Anemia   . Arthritis   . CHF (congestive heart failure)       . CHF (congestive heart failure)       . Chronic diastolic heart failure     04/05/2020  . COPD (chronic obstructive pulmonary disease)       . Mitral stenosis   . Neuralgia   . Permanent atrial fibrillation with rapid ventricular response    04/05/2020  [2] Past Surgical History: Procedure Laterality Date  . CHOLECYSTECTOMY    . COLONOSCOPY W/ POLYPECTOMY    . ESOPHAGOGASTRODUODENOSCOPY    . HYSTERECTOMY    . KNEE  ARTHROSCOPY Left   . OOPHORECTOMY    . TUBAL LIGATION    [3] Family History Problem Relation Age of Onset  . Kidney cancer Mother   . Breast cancer Niece   . Breast cancer Niece   [4] Social History Socioeconomic History  . Marital status: Widowed  Tobacco Use  . Smoking status: Former  . Smokeless tobacco: Never  Vaping Use  . Vaping status: Never Used  Substance and Sexual Activity  . Alcohol use: Never  . Drug use: Never  . Sexual activity: Not Currently   Social Drivers of Health   Financial Resource Strain: Low Risk  (02/06/2024)   Overall Financial Resource Strain (CARDIA)   . Difficulty of Paying Living Expenses: Not very hard  Food Insecurity: No Food Insecurity (02/06/2024)   Hunger Vital Sign   . Worried About Community education officer in the Last Year: Never true   . Ran Out of Food in the Last Year: Never true  Transportation Needs: No Transportation Needs (02/19/2024)   PRAPARE - Transportation   . Lack of Transportation (Medical): No   . Lack of Transportation (Non-Medical): No  Physical Activity: Inactive (02/06/2024)   Exercise Vital Sign   . Days of Exercise per Week: 0 days   . Minutes of Exercise per Session: 0 min  Stress: No Stress Concern Present (02/06/2024)   [employer] of Occupational Health - Occupational Stress Questionnaire   . Feeling of Stress: Only a little  Social Connections: Socially Isolated (02/06/2024)   Social Connection and Isolation Panel   . Frequency of Communication with Friends and Family: More than three times a week   . Frequency of Social Gatherings with Friends and Family: Three times a week   . Attends Religious Services: Patient unable to answer   . Active Member of Clubs or Organizations: No   . Attends Banker Meetings: Never   . Marital Status: Widowed  Housing: Low Risk  (02/06/2024)   Housing   . Within the past 12 months, have you ever stayed: outside, in a car, in a tent, in an overnight shelter, or temporarily in someone else's home (i.e. couch-surfing)?: No   . Are you worried about losing your housing?: No   Rosamond Lyndy Beagle, MD 02/23/24 [phone]

## 2024-02-23 NOTE — Progress Notes (Addendum)
 Cardiology Office Note:  .   Date:  02/23/2024 ID:  Amber Hull, DOB 24-Jan-1932, MRN [id number] PCP: Trudy Vaughn FALCON, MD  [facility] Providers Cardiologist:  Alvan Carrier, MD    History of Present Illness: .   Amber Hull is a 89 y.o. female with a PMH of chest pain, A-fib, chronic diastolic CHF, chronic respiratory failure (on chronic O2)/ COPD, HTN, moderate mitral valve stenosis, CKD stage 3b and chronic anemia (followed by Dr. Rachele), MGUS, and GERD, who presents today for overdue follow-up.   Last seen by Dr. Carrier Alvan on December 18, 2022. Pt noted some recent palpitations, and showed signs of fluid overload. She also reported some atypical symptoms of chest pain. Metoprolol  was switched to bisoprolol  at 5 mg daily and was instructed to take Lasix  40 mg daily x 4 days, then return back to PRN dosing.   I last saw her for 1 month follow-up with her friend, Arland on January 13, 2023. Pt is a poor historian due to [facility]. Admitted to chest pain and shortness of breath and was found to be in respiratory distress, was sent to the ED for further evaluation.   Admitted at [facility] for acute respiratory failure with hypoxia. BNP minimally elevated. Trops negative. D-dimer below age-adjusted normal limits. V/Q scan showed overall low risk of PE. Workup showed to suggest ILD as underlying etiology. CT of chest showed fibrotic ILD. Cardiology consulted. TTE EF 60-65% with rheumatic MV without severe valvular stenosis/insufficiency, diuresed well. Tx for AECOPD.   02/03/2023 - Today she presents for hospital follow-up. Doing much better and feels better. Breathing is back to baseline. Denies any chest pain, worsening shortness of breath, palpitations, syncope, presyncope, dizziness, orthopnea, PND, swelling or significant weight changes, acute bleeding, or claudication.  02/23/2024-in the interim, patient has had several ED visits as well as hospital admissions.  Most recently, she  was admitted in July 2025 due to A-fib with RVR and CHF exacerbation.  She was discharged to SNF for rehab.  Today she presents for follow-up with her daughter, Amber Hull.  She is not doing well at all.  She admits to feeling sick/nauseous.  Daughter says this has been going on for quite a while and daughter states she is having quite a bit of back pain.  Patient sits up in wheelchair with pillow behind her.  She says she feels like she is running a fever.  Patient is clammy while speaking with her and pale.  See physical exam below. She is a poor historian because she does not feel good. Denies any chest pain,  palpitations, syncope, presyncope, dizziness, orthopnea, PND, swelling or significant weight changes, acute bleeding, or claudication. She admits to shortness of breath.   Studies Reviewed: SABRA    EKG:  EKG Interpretation Date/Time:  Tuesday February 23 2024 15:07:53 EDT Ventricular Rate:  78 PR Interval:    QRS Duration:  74 QT Interval:  354 QTC Calculation: 403 R Axis:   124  Text Interpretation: Atrial fibrillation Possible Right ventricular hypertrophy When compared with ECG of 13-Jan-2023 16:44, QRS axis Shifted right Nonspecific T wave abnormality no longer evident in Inferior leads Confirmed by Miriam Norris [phone]) on 02/23/2024 3:21:43 PM   Echo 01/2023:  1. Left ventricular ejection fraction, by estimation, is 60 to 65%. The  left ventricle has normal function. The left ventricle has no regional  wall motion abnormalities. Left ventricular diastolic parameters are  indeterminate.   2. Right ventricular systolic function is  normal. The right ventricular  size is normal. There is moderately elevated pulmonary artery systolic  pressure.   3. Left atrial size was severely dilated.   4. Right atrial size was severely dilated.   5. Diastolic doming of anterior MV leaflet suggseting rheumatic valvular  disease. Mean gradient 4 mmHg at HR 83 bpm. The mitral valve is abnormal.  Trivial  mitral valve regurgitation. Mild mitral stenosis. The mean mitral  valve gradient is 4.0 mmHg.   6. The tricuspid valve is abnormal.   7. The aortic valve is tricuspid. Aortic valve regurgitation is not  visualized. No aortic stenosis is present.   8. The inferior vena cava is dilated in size with >50% respiratory  variability, suggesting right atrial pressure of 8 mmHg.    Physical Exam:   VS:  BP (!) 116/52   Pulse 77   Wt 120 lb (54.4 kg)   SpO2 100%   BMI 18.25 kg/m    Wt Readings from Last 3 Encounters:  02/23/24 120 lb (54.4 kg)  02/03/23 161 lb 12.8 oz (73.4 kg)  01/16/23 163 lb (73.9 kg)    GEN: Thin, frail 89 y.o. female in acute distress, wearing chronic oxygen , appears to be in pain, skin is clammy/cool, she is pale, she is moaning NECK: No JVD; No carotid bruits CARDIAC: S1/S2, irregularly irregular, Grade 1/6 murmur, no rubs, no gallops RESPIRATORY: Overall clear and diminished without wheezing, rales, or rhonchi, tachypnea and hyperventilation noted.  ABDOMEN: Soft, non-tender, non-distended EXTREMITIES:  No edema; No deformity   ASSESSMENT AND PLAN: .    Shortness of breath A-fib Nausea   Upon entering patient room, she is found to be in acute distress.  She is in quite a bit of pain and wincing as well as hyperventilating and moaning while sitting in wheelchair.  EKG today reveals rate controlled A-fib.  She is wearing chronic oxygen .  Tells me she does not feel well at all.  She is feeling nauseous.  I am concerned about possible sepsis. WBC from 7/31 was 13.0. I highly advised EMS transportation to ED for further evaluation of her symptoms and pt and daughter are in agreement.  Daughter states that she cannot take mother in her car as she will not be able to transport her. Will arrange EMS for ED evaluation. Pt may need palliative care referral/discussion regarding goals of care.   I spent a total duration of 40 minutes reviewing prior notes, reviewing outside  records including  labs, EKG today, face-to-face counseling of medical condition, pathophysiology, evaluation, management, and documenting the findings in the note.   Dispo: Follow-up in 1-2 weeks with me or APP post hospital d/c or sooner if anything changes.    Signed, Almarie Crate, NP

## 2024-02-24 DIAGNOSIS — I5023 Acute on chronic systolic (congestive) heart failure: Secondary | ICD-10-CM | POA: Diagnosis not present

## 2024-02-24 DIAGNOSIS — Z7401 Bed confinement status: Secondary | ICD-10-CM | POA: Diagnosis not present

## 2024-02-24 DIAGNOSIS — R531 Weakness: Secondary | ICD-10-CM | POA: Diagnosis not present

## 2024-02-24 NOTE — LTC Provider Review (Signed)
   OCCUPATIONAL THERAPY TREATMENT NOTE   Patient Name:  Amber Hull      Medical Record Number: [id number]  Date of Birth: [birth year] Location: [facility] [place]   PT Precautions: Falls,   OT Precautions: Falls,   SLP Precautions:  ,    Weight Bearing: RUE Weight Bearing: No Restrictions   LUE Weight Bearing: No Restrictions   RLE Weight Bearing: No Restrictions   LLE Weight Bearing: No Restrictions    SUBJECTIVE Pt participated in ADLs on this date.   OBJECTIVE Today's Treatment    Bed Mobility From 1: Supine Bed Mobility Type 1: To and from Bed Mobility to 1: Short sit Level of Assistance 1: Modified independence        Transfer from: Sit Transfer Type : To and from Transfer to : Stand Technique : Sit to stand, Stand to sit Transfer Device : bed enabler Transfer Level of Assistance : Modified independence Trials/Comments : Pt demonstrated good safety awareness with nasal canual cord with STS  Transfer from: Bed Transfer Type : To and from Transfer to : Commode-drop arm Technique : Stand pivot Transfer Device : bed enabler Transfer Level of Assistance : Supervision Trials/Comments : SPT from EOB<>BSC using bed enabler required Supervision with vc for safety secondary to pt utilizing tray table to support hand with pivot for balance       UB Dressing: Set up LB Dressing: Set up Footwear management: Set up Toilet Hygiene: Set up Toilet: Clothing Management: Set up           Ther-Act    Deficits addressed: Energy Conservation, Safety Awareness, Fine Motor Strength/Coordination, Functional Reach Functional Reach: Forward, Lateral, Crossing Midline           Therapeutic Tools Used/Treatment Activity : Pt engaged in therapeutic activities while seated at EOB to increase fine motor manipulation, bilateral integration and coordination, leaning, weight shifting left<>right/forward<>back, and strength to improve with ADLs.   Nurse notified of pt reporting  lower abdominal discomfort/cramping.  Pt reports constipation.          I attest that I have reviewed the above information. Signed: Suzen Kerns, OTA 02/24/2024

## 2024-02-24 NOTE — Nursing Note (Signed)
 PO meds tolerated without difficulty. Skin warm and dry. Able to make needs known. Remains on fluids restrictions. Uses bedside commode during the night. Resting quietly with eyes closed, resp even and non labored. HOB elevated and call bell in reach.

## 2024-03-03 DIAGNOSIS — J449 Chronic obstructive pulmonary disease, unspecified: Secondary | ICD-10-CM | POA: Diagnosis not present

## 2024-03-03 NOTE — Nursing Note (Signed)
 SW placed order for hospital bed in [facility] but when SW called to speak to [place] regarding copay and amount due, she said she had told them to cancel the order.  She said resident did not want a hospital bed and wants to go up the stairs to her room.  She has a follow up with her primary MD on Monday 03/07/24 and SW told her if she changed her mind, she would need to go through this MD.  She thanked SW and voiced understanding.

## 2024-03-03 NOTE — LTC Provider Review (Signed)
 OCCUPATIONAL THERAPY DISCHARGE SUMMARY    Patient Name:  Amber Hull      Medical Record Number: [id number]  Date of Birth: Nov 13, 1931 Location: [facility] [place] Physician:Daniel, Jerel  PT Precautions: Falls,   OT Precautions: Falls,   SLP Precautions:  ,    Weight Bearing: RUE Weight Bearing: No Restrictions   LUE Weight Bearing: No Restrictions   RLE Weight Bearing: No Restrictions   LLE Weight Bearing: No Restrictions    SUBJECTIVE Pt reported feeling nauseous but wiling to receive education upon dc home.  OBJECTIVE Today's Treatment : Pt education on CLOF with therapy. Dc recommendations for safety and  transfers upon return home   Dominant hand is Right, Patient Able to Express Needs/Desires: Yes Hearing - Right Ear: Mild Impairment, Hearing - Left Ear: Mild Impairment  Basic ADLs: Patient Able to Express Needs/Desires: Yes Location: Chair level Self-Feeding (Utensils): Set up Self-Feeding (Cups): Set up Grooming: Supervision (standing at sink) Oral Care: Supervision (standing at sink) UB Bathing: Supervision (seated on shower chair) LB Bathing: Standby assistance (standing for pericare hygiene in shower; then Supervision for sitting on shower chair reaching feet and washing LE's.) UB Dressing: Set up LB Dressing: Set up Footwear management: Set up Toilet Hygiene: Set up Toilet: Clothing Management: Set up Assistive Devices : Rolling walker  Functional Standing:  Time/Activity: Pt demonstrated max standing endurance 6 mins with Fair balance.  Assistive Devices : Rexford Finder  Transfers:  Toilet: Toilet Transfer Technique: Medical sales representative device used: Sports coach to / from Toilet: Engineer, civil (consulting) assistance: [employer] Transfers Comments: Functional mobility using rollator to and from bathroom during shower tasks required SBA with 25% vcs  for safety awareness and safety techniques due to pt attempting to pull rollator behind self to ambulate back to EOB once completing ADLS.    Shower: Transfer In/Out- Shower: Ambulating, Teacher, early years/pre to: Shower seat with arms  Musician Device: Geophysicist/field seismologist Transfers assistance: Paediatric nurse Comments: Functional transfer in and out of shower from using rollator then transitioning with grab bar for SPT required CGA with 25% vcs for technique and safety.   Additional Transfer: Transfer 1 Transfer from: Sit Transfer Type : To and from Transfer to : Stand Technique : Sit to stand, Stand to sit Transfer Device : rollator Transfer Level of Assistance : Standby assistance, Minimal verbal cues Trials/Comments : 25% vcs for safety sequencing techniques using Rollator.    Transfer 2 Transfer from: Bed Transfer Type : To Transfer to : Wheelchair Technique : Ambulatory Transfer Device : Producer, television/film/video Level of Assistance : Standby assistance Trials/Comments : Functional mobility using rollator (multiple trials) requires SBA with 50% vcs for technique; SPT with Rollator requires SBA with 50% vcs for technique and safety locking brakes, nasal canula mgmt to decrease fall risk.   IADLs:  ASSESSMENT Short Term Goals: Short Term Goal #1: Patient will complete UB dressing with set up assist only, LB dressing with Min A  Date Established : 03/03/24 Time Frame : 2 weeks  Plan : Met   Short Term Goal #2: Patient will perform toilet transfer at RW level with CGA, hygiene and clothing mgmt with Min A  Date Established : 03/03/24 Time Frame : 2 weeks  Plan : Met   Short Term Goal #3: Patient will complete hygiene/grooming routine including hair and  oral care, face and hand wash in standing X 2 min with CGA.  Date Established : 03/03/24 Time Frame : 2 weeks  Plan : Met   Short Term Goal #4: Patient will demonstrate increased B UE MMT  to 4/5 to support safety during all self care tasks.  Date Established : 03/03/24 Time Frame : 2 weeks  Plan : Goal Discontinued   Short Term Goal #5: Paient will complete showering at shower chair with min A with fair + dynamic standing balance and good safety awareness for SPR using RW with CGA.  Date Established : 03/03/24 Time Frame : 2 weeks  Plan : Met    Long Term Goals: LTC Long Term Goal #1: Patient will complete UB/LB dressing, toilet transfer, hygiene and clothing mgmt with supervision using least restrictive device for fucntional mobility without concerns for safety.  Date Established : 03/03/24 Time Frame : 2 weeks  Plan : Met   LTC Long Term Goal #2: Patient will complete functional task in standing requiring intermittent single UE support X 8 min with good balance, and upright posture.  Date Established : 03/03/24 Time Frame : 2 weeks  Plan : Goal Discontinued   Long Term Goal #3: Patient will demonstrate B UE MMT to 4+/5 to support return to prior independent living  Date Established : 03/03/24 Time Frame : 2 weeks  Plan : Goal Discontinued    Analysis of Functional Outcome/Clinical Impression: Fair Patient/Caregiver Training: Pt educated on functional mobility and transfer safety upon safe return home. Pt with no questions/concerns in regards to discharge home  PLAN OT Plan: No skilled OT No Skilled OT: Safe to return home with daughter providing 24hr sup/assist Equipment Recommendations: 3-in-1 commode over the toilet, Raised commode seat with rails, Rollator      Discharge Recommendations: 24 hour supervision/assist, Home OT, AM/PM assistance  I attest that I have reviewed the above information. Signed: Kailan A Parker, OT Fredricka 03/03/2024

## 2024-03-03 NOTE — LTC Provider Review (Signed)
 PHYSICAL THERAPY TREATMENT NOTE      Patient Name:  Amber Hull      Medical Record Number: [id number]  Date of Birth: 1932-05-27 Location: [facility] [place]        PT Precautions: Falls,   OT Precautions: Falls,   SLP Precautions:  ,    Weight Bearing: RUE Weight Bearing: No Restrictions   LUE Weight Bearing: No Restrictions   RLE Weight Bearing: No Restrictions   LLE Weight Bearing: No Restrictions    SUBJECTIVE Discussed safety in home environment and home health PT with patient today.   OBJECTIVE Today's Treatment:   Transfer from: Wheelchair Transfer Type 1: To and from Transfer to 1: Stand Technique 1: Sit to stand Transfer Device 1: RW Transfer Level of Assistance 1: Minimal verbal cues, Standby assistance Trials/Comments 1: Transfer training STS with SB(A) and verbal cues for hand placement and sequencing as well as safety awareness with locking brakes and proper pre-stand positioning to improve functional mobility and decrease fall risk with transfers.  Transfer From 2: Wheelchair Transfer Type 2: To and from Transfer to 2: Commode-standard Technique 2: Stand pivot Transfer Device 2: RW Transfer Level of Assistance 2: Minimal verbal cues, Standby assistance Trials/Comments 2: Transfer training SPT using RW with SB(A) and verbal cues for hand placement and sequencing as well as safety awareness with locking brakes and proper pre-stand positioning to improve functional mobility and decrease fall risk with transfers.     I attest that I have reviewed the above information. SignedBETHA Rankin Lime, PTA 03/03/2024

## 2024-03-03 NOTE — LTC Provider Review (Signed)
 ------------------------------------------------------------------------------- Summary: Discharge -------------------------------------------------------------------------------      PHYSICAL THERAPY DISCHARGE SUMMARY       Patient Name:  Amber Hull      Medical Record Number: [id number]  Date of Birth: 25-Jun-1932  Sex: Female       Location: [facility] [place] Physician: Toribio Larve   PT Precautions: Falls,   OT Precautions: Falls,   SLP Precautions:  ,    Weight Bearing: RUE Weight Bearing: No Restrictions   LUE Weight Bearing: No Restrictions   RLE Weight Bearing: No Restrictions   LLE Weight Bearing: No Restrictions    SUBJECTIVE Patient reports she felt sick this morning and vomit once this morning. She states her stomach has not felt right. Patient reports she feels ready to go home with daughter tomorrow.   OBJECTIVE  Bed Mobility:  Bed Mobility From 1: Supine Bed Mobility Type 1: To and from Bed Mobility to 1: Short sit Level of Assistance 1: Modified independence Bed Mobility Comments 1: Pt required minimal verbal cuing to improve abdominal control and hand placement to increase trunk stability and safety with supine <> sit.    Transfers:   Transfer from: Wheelchair, Bed Transfer Type 1: To and from Transfer to 1: Stand, Bed, Wheelchair Technique 1: Sit to stand, Stand pivot, Stand to sit Transfer Device 1: RW Transfer Level of Assistance 1: Minimal verbal cues, Standby assistance Trials/Comments 1: Pt required minimal verbal cuing to improve hand placement, MM control, and sequencing to increase safety and independence with functional SPTs with AD. Therapist provided SBA for safety. Seated rest periods required due to fatigue and increased slight SOB.    Transfer From 2: Wheelchair Transfer Type 2: To and from Transfer to 2: Commode-standard Technique 2: Stand pivot Transfer Device 2: RW Transfer Level of  Assistance 2: Minimal verbal cues, Standby assistance Trials/Comments 2: Transfer training SPT using RW with SB(A) and verbal cues for hand placement and sequencing as well as safety awareness with locking brakes and proper pre-stand positioning to improve functional mobility and decrease fall risk with transfers.      Gait:     (gait from previous session) Surface 1: Level tile Device 1: Rollator Other Apparatus 1: Wheelchair follow Assistance 1: Contact guard, Standby Assistance, Minimal verbal cues Quality of Gait 1: Pt required education and verbal cuing to pace self to improve cardio pulmonary endurance. Pt required minimal verbal cuing to improve heel strike and upright postural control to increase BOS, steadiness, and safety with mobility for ADLs. Pt required time due to slow cadence with seated rest periods due to fatigue. Comments/Distance (ft) 1: Gait training with Rollator from room into hallway x 200' x 2 reps with SBA-CGA and w/c follow for safety and 02 support as well as cues for locking brakes and use of seat when SOB showing need for continued skilled PT interventions.     Stairs Mobility:  (stair mobility from previous session)  Rails 1: Left Device 1: No device Assistance 1: Minimum assistance, Minimal verbal cues Comment/# Steps 1: Patient instructed in gait training ascending/descending stairs with Left side rail ascending and same rail descending to train patient for return home, patient required CG(A) at best with frequent verbal cues for safety, foot placement and sequencing, patient had moderate to severe DOE after ascending/descending 3 steps twice (6 steps) and required long rest to recover x 2 sets of 2 reps today with verbal cues required each time showing need for continued  skilled PT intervention to improve safety with stairs as well as patient needs to increase cardiopulmonary capacity to be able to safety negotiate 5 steps at home after Amb through the house.           ASSESSMENT Short Term Goals: SHORT GOAL #1: Patient will improve ability to safely transfer from lying on back to sitting on the side of the bed and with no back support with ModI in order to participate in ADLS and prepare for transfer activities  Date Established : 02/12/24 Time Frame : 2 weeks  Plan : Met   Short Term Goal #2: Patient will improve ability to safely transfer to standing position from sitting in a chair, wheelchair or on the side of the bed with SBA with ability to right self to achieve/maintain balance and with recognition of safety hazards.  Date Established : 02/12/24 Time Frame : 2 weeks  Plan : Met   Short Term Goal #3: Patient will improve ability to safely and efficiently transfer to and from a bed to a chair (or wheelchair) with SBA with ability to right self to achieve/maintain balance.  Date Established : 02/12/24 Time Frame : 2 weeks  Plan : Met   Short Term Goal #4: Patient will increase ability to stand Supported for 5 mins in order to increase participation with ADL tasks and functional mobility.  Date Established : 02/12/24 Time Frame : 2 weeks  Plan : Met    Patient will increase BLE muscle strength to 4/5 in order to increase independence with functional mobility.  Plan : Not Met, Goal Discontinued    Patient will safely ambulate 164ft with SBA with increased cadence using RW with ability to right self to achieve/maintain balance, with implementation of compensatory strategies and with recognition of safety hazards.  Plan : Met   Long Term Goals: Long Term Goal #1: Patient will safely ambulate 163ft with SBA with increased cadence using RW with ability to right self to achieve/maintain balance, with implementation of compensatory strategies and with recognition of safety hazards.  Date Established : 02/12/24 Time Frame : 4 weeks  Plan : Met   Long Term Goal #2: PT/PTA will provide patient and caregiver education on all functional mobility,  recommend home modifications as appropriate, and arrange and/or fit adaptive equipment as necessary to enhance patient's safety in their living environment in order to return home at highest practical level of functional independence and with assist from family and home health as needed.  Date Established : 02/12/24 Time Frame : 4 weeks  Plan : Met   Long Term Goal #3: Patient will be able to improve 30 sec STS for 2 reps at EOB to 5 reps to increase LE strength and independence with transfers.  Date Established : 02/12/24 Time Frame : 4 weeks  Plan : Met   Long Term Goal #4: Patient will be able to perform 6 steps safely in stair training in order to return home to PLOF.  Date Established : 02/12/24 Time Frame : 4 weeks  Plan : Met  Analysis of Functional Outcome/Clinical Impression: Motivated to return home with family, participated well with therapy interventions  Patient/ Caregiver Training: yes. Daughter/family made aware of patient's current condition.   PLAN PT Plan: No skilled PT No Skilled PT: Pt exhausted benefits- no further treatment  PT Discharge Recommendations: Home with Supervision, Outpatient PT, Home with Assistance, Home PT     Equipment Recommended: Rolling walker, Rollator, SPC    I  attest that I have reviewed the above information. Signed: Rankin Lawrence, PT Filed 03/03/2024

## 2024-03-03 NOTE — Nursing Note (Signed)
 SW spoke with resident's RP by phone this morning,  SW offered private LTC room vs. Semiprivate.  He said she would need semiprivate as she will most likely have to apply for Medicaid in several months.    SW told RP Juliaette Ned) the room she will move to;  he said he could bring in her t.v. from home.  SW suggested chester drawers also to sit t.v. on as it is 40 inches.  SW also told him she will have a roommate.   He said he thinks that will be good for her.   SW will let him know before we actually move her but most likely will be early next week.

## 2024-03-04 DIAGNOSIS — I5032 Chronic diastolic (congestive) heart failure: Secondary | ICD-10-CM | POA: Diagnosis not present

## 2024-03-04 DIAGNOSIS — J9611 Chronic respiratory failure with hypoxia: Secondary | ICD-10-CM | POA: Diagnosis not present

## 2024-03-04 DIAGNOSIS — J410 Simple chronic bronchitis: Secondary | ICD-10-CM | POA: Diagnosis not present

## 2024-03-04 DIAGNOSIS — R54 Age-related physical debility: Secondary | ICD-10-CM | POA: Diagnosis not present

## 2024-03-04 NOTE — Discharge Summary (Signed)
 Discharge Summary         Admit date:    02/11/2024 Discharge date:   03/04/2024 Length of stay:    LOS: 22 days     Discharge Attending Physician: Jerel KANDICE Sieving, MD  Discharge to:    To Home with Home Health Condition at Discharge:  fair Code Status:    Full Code  Patient Care Team: Trudy Vaughn Lerner, MD as PCP - General (Internal Medicine)  Consults       none  Discharge Diagnoses  Principal Problem:   Atrial fibrillation     Active Problems:   Chronic respiratory failure with hypoxia       COPD (chronic obstructive pulmonary disease)       Esophageal reflux   CKD stage 3b, GFR 30-44 ml/min (CMS-HCC)   Pulmonary fibrosis       Essential hypertension   Acute on chronic diastolic heart failure       Moderate recurrent major depression (CMS-HCC)   Chronic respiratory failure with hypercapnia       Dependence on supplemental oxygen    Increased white blood cell count   Bilateral hearing loss   Neuralgia   Insomnia, unspecified   Cognitive communication deficit   Muscle wasting and atrophy, not elsewhere classified, left upper arm   Muscle wasting and atrophy, not elsewhere classified, right upper arm   Other abnormalities of gait and mobility   Hospital Course  This 89 year old white female with chronic respiratory failure with hypoxia COPD gastroesophageal reflux disease pulmonary fibrosis major moderate recurrent depression and atrial fibrillation was admitted after a hospital stay at [facility].  During her time at rehab she made progress and is now ready for home.  Therapy was completed.  She will be discharged to home today on her usual regimen and will schedule follow-up with Dr. Trudy in our office next week.  I spent less than 30 mins in the discharge of this patient.  Allergies  Allergies[1]    Last Vital sign  BP 140/64   Pulse 79   Temp 36.5 C (97.7 F) (Oral)   Resp 20   Ht 165.1 cm (5' 5)   Wt 51.6 kg (113 lb 11.2 oz)   SpO2  100%   BMI 18.92 kg/m    Procedures  none  Discharge Medications     Your Medication List     STOP taking these medications    ascorbic acid  (vitamin C ) 500 MG tablet Commonly known as: VITAMIN C    bisoprolol  5 MG tablet Commonly known as: ZEBETA    hydrOXYzine  25 MG tablet Commonly known as: ATARAX        START taking these medications    dilTIAZem  240 MG 24 hr capsule Commonly known as: CARDIZEM  CD Take 1 capsule (240 mg total) by mouth in the morning.   metoPROLOL  succinate 100 MG 24 hr tablet Commonly known as: Toprol -XL Take 1 tablet (100 mg total) by mouth in the morning.       CHANGE how you take these medications    calcitriol 0.25 MCG capsule Commonly known as: ROCALTROL Take 1 capsule (0.25 mcg total) by mouth every Monday, Wednesday, and Friday. What changed:  when to take this additional instructions       CONTINUE taking these medications    albuterol  90 mcg/actuation inhaler Commonly known as: PROVENTIL  HFA;VENTOLIN  HFA Inhale 2 puffs every four (4) hours as needed for wheezing.   budesonide-formoterol 160-4.5 mcg/actuation inhaler Commonly known as: SYMBICORT  cholecalciferol  (vitamin D3 25 mcg (1,000 units)) 1,000 unit (25 mcg) tablet Take 1 tablet (25 mcg total) by mouth.   esomeprazole  40 MG capsule Commonly known as: NEXIUM    fexofenadine 180 MG tablet Commonly known as: ALLEGRA   furosemide  40 MG tablet Commonly known as: LASIX  Take 0.5 tablets (20 mg total) by mouth daily.   gabapentin  400 MG capsule Commonly known as: NEURONTIN  Take 1 capsule (400 mg total) by mouth nightly.   ipratropium-albuterol  0.5-2.5 mg/3 mL nebulizer Commonly known as: DUO-NEB Inhale 3 mL by nebulization every four (4) hours as needed.   losartan 100 MG tablet Commonly known as: COZAAR Take 1 tablet (100 mg total) by mouth daily.   melatonin 3 mg Tab Take 1 tablet (3 mg total) by mouth nightly as needed.   montelukast  10 mg  tablet Commonly known as: SINGULAIR  Take 1 tablet (10 mg total) by mouth nightly.        Pending Test Results   none   Lab Results    BLOOD No results for input(s): WBC, HGB, HCT, PLT in the last 168 hours. No results for input(s): NA, K, CL, CO2, BUN, CREATININE, GLU, CALCIUM , ALBUMIN , PROT, BILITOT, AST, ALT, ALKPHOS, MG, PHOS, LIPASE, LACTATE, AMMONIA, CAION in the last 168 hours. No results for input(s): CKTOTAL, TROPONINI, TROPONINT, CKMB, EDTPNI, PROBNP, CHOL, LDL, HDL, TRIG in the last 168 hours. No results for input(s): INR, LABPROT, APTT, DDIMER in the last 168 hours. No results for input(s): TSH, ETOH, ACETAMIN, SALICYLATE, FREET3, T4FREE, A1C, ACETONE, TTR, ESR, CRP, HSCRP, ANA, RF, VITAMINB12, FOLATE, IRON, LABIRON, TIBC, FERRITIN, RETIC, RETICCTPCT, C3, C4, LDH, HAPTM, URICACID, HEPP4, CEA, RAPSCRN, CDIFRPCR, CDIFFNAP1, HIV12SCRN, HIVCP, TBCELLQN in the last 168 hours.  No results for input(s): HEPAIGM, HEPBSAG, HEPBIGM, HEPCAB, MITOAB in the last 168 hours.  URINE No results for input(s): WBCUA, NITRITE, LEUKOCYTESUR, BACTERIA, RBCUA, BLOODU, GLUCOSEU, PROTEINUA, KETONESU in the last 168 hours. No results for input(s): KETUR, PREGTESTUR, PREGPOC, NAURINE, LABOSMO, OSMOFT, PROTEINUR, CREATUR, PCRATIOUR, OPIAU, BENZU, TRICYCLIC, PCPU, AMPHU, COCAU, CANNAU, BARBU, URPROTELEC in the last 168 hours.  BODY FLUIDS No results for input(s): FTYP1, WBCFLUID, FNEUT, LYMPHSFL, FMONO, EOSFL, RBCFL, CLARITYFLUID, COLORFL in the last 168 hours.  ABG No results for input(s): O2SOUR, FIO2ART, PHART, PCO2ART, PO2ART, HCO3ART, O2SATART, BEART in the last 72 hours.  Microbiology Results (last day)     ** No results found for the last 24 hours. **        Imaging   ECG 12 Lead Result Date: 02/29/2024 Atrial fibrillation Left axis deviation Nonspecific ST abnormality Abnormal ECG When compared with ECG of 07-Feb-2024 02:53, Minimal criteria for Anterior infarct are no longer present Non-specific change in ST segment in Lateral leads Nonspecific T wave abnormality, improved in Anterolateral leads  CT Abdomen Pelvis Wo Contrast Renal Protocol Result Date: 02/23/2024 Exam:  CT of the Abdomen and Pelvis without Contrast  History:  Right-sided abdominal pain.  Technique: Routine CT of the abdomen and pelvis without IV contrast. AEC (automated exposure control) and/or manual techniques such as size-specific kV and mAs are employed where appropriate to reduce radiation exposure for all CT exams.  Comparison:   No recent CT comparison available.  Abdomen and Pelvis CT Findings: Images are degraded by motion.  LOWER THORAX:  Fibrotic changes of the lung bases. Enlarged heart. Coronary artery calcifications. Aortic valvular calcifications.  HEPATOBILIARY:  Normal morphology and contour of the liver.  The gallbladder is surgically absent.  PANCREAS:  No acute findings.  SPLEEN:  Normal.  ADRENALS:  Normal.  KIDNEYS/URETERS:  Nonobstructing 0.3 cm left nephrolithiasis. No hydronephrosis or radiopaque ureteral stone. Mild diffuse bladder wall thickening is suggested. No bladder stone.  REPRODUCTIVE ORGANS:  Uterus appears surgically absent.  BOWEL/MESENTERY: The stomach is normal  the small bowel is not dilated. There are multiple mildly prominent fluid-filled loops of small bowel predominantly in the left abdomen and the pelvis. The appendix is not clearly delineated. No secondary signs of appendicitis.  Large colonic stool burden is seen. Diverticulosis is noted. No CT findings of diverticulitis.SABRA No pneumatosis.  PERITONEUM AND RETROPERITONEUM: No free air. No significant free fluid.  VASCULAR:  The aorta is not dilated.  LYMPH NODES:  No adenopathy.  BONES/SOFT  TISSUES:  No acute findings. Probable osteopenia. Degenerative changes of the spine.    1.    Multiple mildly prominent fluid-filled loops of small bowel in the left abdomen and pelvis may represent enteritis. 2.    Large colonic stool burden. 3.    Nonobstructing left nephrolithiasis. 4.    Mild diffuse bladder wall thickening may be secondary to underdistention versus cystitis. Correlate with clinically and with urinalysis if indicated.  Signed (Electronic Signature): 02/23/2024 5:58 PM Signed By: Ellouise Platt, MD   Discharge Instructions   Diet Instructions   No Added Salt diet. fluid restriction per day.     Activity Instructions   Ambulate with assistance.     Follow Up instructions and Outpatient Referrals    Ambulatory Referral to Home Health     Reason for referral: Home Health post Rehab   Physician to follow patient's care: PCP   Disciplines requested:  Physical Therapy Occupational Therapy Medical Social Work     Physical Therapy requested:  Home safety evaluation Evaluate and treat Ambulation training Transfer training Strengthening exercises     Occupational Therapy Requested:  Home safety evaluation Transfer training ADL or IADL training Strengthening exercises Evaluate and treat     Medical Social Work requested: General area of patient need   General area of patient need: resources in community   Requested follow up plan: I would resume responsibility.    Resources and Referrals   [facility] for PT, OT and SW   Phone number:  [phone]       Jerel KANDICE Sieving, MD             [1] Allergies Allergen Reactions  . Penicillins Rash    Has patient had a PCN reaction causing immediate rash, facial/tongue/throat swelling, SOB or lightheadedness with hypotension: No Has patient had a PCN reaction causing severe rash involving mucus membranes or skin necrosis: No Has patient had a PCN reaction that required hospitalization: No Has patient  had a PCN reaction occurring within the last 10 years: No If all of the above answers are NO, then may proceed with Cephalosporin use.

## 2024-03-04 NOTE — Nursing Note (Signed)
 Alert. No complaints of pain. No distress. O2 in place. One assist. Meds given and tolerated. Resting in bed with call light in reach. Continues therapy.

## 2024-03-04 NOTE — Nursing Note (Signed)
 Discharge instructions reviewed with daughter, verbalized understanding. Pt escorted to car via [facility] staff and left with daughter being discharged home at 1220.

## 2024-03-07 ENCOUNTER — Telehealth: Payer: Self-pay

## 2024-03-07 DIAGNOSIS — K219 Gastro-esophageal reflux disease without esophagitis: Secondary | ICD-10-CM | POA: Diagnosis not present

## 2024-03-07 DIAGNOSIS — I5033 Acute on chronic diastolic (congestive) heart failure: Secondary | ICD-10-CM | POA: Diagnosis not present

## 2024-03-07 DIAGNOSIS — I130 Hypertensive heart and chronic kidney disease with heart failure and stage 1 through stage 4 chronic kidney disease, or unspecified chronic kidney disease: Secondary | ICD-10-CM | POA: Diagnosis not present

## 2024-03-07 DIAGNOSIS — F331 Major depressive disorder, recurrent, moderate: Secondary | ICD-10-CM | POA: Diagnosis not present

## 2024-03-07 DIAGNOSIS — I5022 Chronic systolic (congestive) heart failure: Secondary | ICD-10-CM | POA: Diagnosis not present

## 2024-03-07 DIAGNOSIS — D631 Anemia in chronic kidney disease: Secondary | ICD-10-CM | POA: Diagnosis not present

## 2024-03-07 DIAGNOSIS — J440 Chronic obstructive pulmonary disease with acute lower respiratory infection: Secondary | ICD-10-CM | POA: Diagnosis not present

## 2024-03-07 DIAGNOSIS — N184 Chronic kidney disease, stage 4 (severe): Secondary | ICD-10-CM | POA: Diagnosis not present

## 2024-03-07 DIAGNOSIS — I4821 Permanent atrial fibrillation: Secondary | ICD-10-CM | POA: Diagnosis not present

## 2024-03-07 NOTE — Transitions of Care (Post Inpatient/ED Visit) (Signed)
   03/07/2024  Name: Amber Hull MRN: [id number] DOB: Jun 18, 1932  Today's TOC FU Call Status: Today's TOC FU Call Status:: Unsuccessful Call (1st Attempt) Unsuccessful Call (1st Attempt) Date: 03/07/24  Attempted to reach the patient regarding the most recent Inpatient/ED visit.  On chart review for this post discharge TOC RN CM outreach phone call,  it was noted patient had recently been a resident of [facility] and discharge disposition was unclear pending care everywhere updates.   RN CM contacted [facility] rehab and was informed patient did discharge home with home health.  RN CM Left a confidential voice mail with RN CM call back number.   Follow Up Plan: Additional outreach attempts will be made to reach the patient to complete the Transitions of Care (Post Inpatient/ED visit) call.    Bing Edison MSN, RN RN Case Sales executive Health  [facility] Office Hours M-F [phone] Direct Dial: [phone] Main Phone [phone]  Fax: [phone] [email or web address]

## 2024-03-08 DIAGNOSIS — K219 Gastro-esophageal reflux disease without esophagitis: Secondary | ICD-10-CM | POA: Diagnosis not present

## 2024-03-08 DIAGNOSIS — J440 Chronic obstructive pulmonary disease with acute lower respiratory infection: Secondary | ICD-10-CM | POA: Diagnosis not present

## 2024-03-08 DIAGNOSIS — I5022 Chronic systolic (congestive) heart failure: Secondary | ICD-10-CM | POA: Diagnosis not present

## 2024-03-08 DIAGNOSIS — F331 Major depressive disorder, recurrent, moderate: Secondary | ICD-10-CM | POA: Diagnosis not present

## 2024-03-08 DIAGNOSIS — I130 Hypertensive heart and chronic kidney disease with heart failure and stage 1 through stage 4 chronic kidney disease, or unspecified chronic kidney disease: Secondary | ICD-10-CM | POA: Diagnosis not present

## 2024-03-08 DIAGNOSIS — I4821 Permanent atrial fibrillation: Secondary | ICD-10-CM | POA: Diagnosis not present

## 2024-03-08 DIAGNOSIS — D631 Anemia in chronic kidney disease: Secondary | ICD-10-CM | POA: Diagnosis not present

## 2024-03-08 DIAGNOSIS — I5033 Acute on chronic diastolic (congestive) heart failure: Secondary | ICD-10-CM | POA: Diagnosis not present

## 2024-03-08 DIAGNOSIS — N184 Chronic kidney disease, stage 4 (severe): Secondary | ICD-10-CM | POA: Diagnosis not present

## 2024-03-09 DIAGNOSIS — D649 Anemia, unspecified: Secondary | ICD-10-CM | POA: Diagnosis not present

## 2024-03-09 DIAGNOSIS — Z6821 Body mass index (BMI) 21.0-21.9, adult: Secondary | ICD-10-CM | POA: Diagnosis not present

## 2024-03-09 DIAGNOSIS — I4891 Unspecified atrial fibrillation: Secondary | ICD-10-CM | POA: Diagnosis not present

## 2024-03-09 DIAGNOSIS — I509 Heart failure, unspecified: Secondary | ICD-10-CM | POA: Diagnosis not present

## 2024-03-11 DIAGNOSIS — I4821 Permanent atrial fibrillation: Secondary | ICD-10-CM | POA: Diagnosis not present

## 2024-03-11 DIAGNOSIS — D631 Anemia in chronic kidney disease: Secondary | ICD-10-CM | POA: Diagnosis not present

## 2024-03-11 DIAGNOSIS — I5033 Acute on chronic diastolic (congestive) heart failure: Secondary | ICD-10-CM | POA: Diagnosis not present

## 2024-03-11 DIAGNOSIS — J440 Chronic obstructive pulmonary disease with acute lower respiratory infection: Secondary | ICD-10-CM | POA: Diagnosis not present

## 2024-03-11 DIAGNOSIS — I4891 Unspecified atrial fibrillation: Secondary | ICD-10-CM | POA: Diagnosis not present

## 2024-03-11 DIAGNOSIS — Z6820 Body mass index (BMI) 20.0-20.9, adult: Secondary | ICD-10-CM | POA: Diagnosis not present

## 2024-03-11 DIAGNOSIS — F331 Major depressive disorder, recurrent, moderate: Secondary | ICD-10-CM | POA: Diagnosis not present

## 2024-03-11 DIAGNOSIS — J849 Interstitial pulmonary disease, unspecified: Secondary | ICD-10-CM | POA: Diagnosis not present

## 2024-03-11 DIAGNOSIS — I130 Hypertensive heart and chronic kidney disease with heart failure and stage 1 through stage 4 chronic kidney disease, or unspecified chronic kidney disease: Secondary | ICD-10-CM | POA: Diagnosis not present

## 2024-03-11 DIAGNOSIS — I509 Heart failure, unspecified: Secondary | ICD-10-CM | POA: Diagnosis not present

## 2024-03-11 DIAGNOSIS — K219 Gastro-esophageal reflux disease without esophagitis: Secondary | ICD-10-CM | POA: Diagnosis not present

## 2024-03-11 DIAGNOSIS — J449 Chronic obstructive pulmonary disease, unspecified: Secondary | ICD-10-CM | POA: Diagnosis not present

## 2024-03-11 DIAGNOSIS — I5022 Chronic systolic (congestive) heart failure: Secondary | ICD-10-CM | POA: Diagnosis not present

## 2024-03-11 DIAGNOSIS — N184 Chronic kidney disease, stage 4 (severe): Secondary | ICD-10-CM | POA: Diagnosis not present

## 2024-03-16 DIAGNOSIS — F331 Major depressive disorder, recurrent, moderate: Secondary | ICD-10-CM | POA: Diagnosis not present

## 2024-03-16 DIAGNOSIS — I130 Hypertensive heart and chronic kidney disease with heart failure and stage 1 through stage 4 chronic kidney disease, or unspecified chronic kidney disease: Secondary | ICD-10-CM | POA: Diagnosis not present

## 2024-03-16 DIAGNOSIS — D631 Anemia in chronic kidney disease: Secondary | ICD-10-CM | POA: Diagnosis not present

## 2024-03-16 DIAGNOSIS — K219 Gastro-esophageal reflux disease without esophagitis: Secondary | ICD-10-CM | POA: Diagnosis not present

## 2024-03-16 DIAGNOSIS — I5033 Acute on chronic diastolic (congestive) heart failure: Secondary | ICD-10-CM | POA: Diagnosis not present

## 2024-03-16 DIAGNOSIS — N184 Chronic kidney disease, stage 4 (severe): Secondary | ICD-10-CM | POA: Diagnosis not present

## 2024-03-16 DIAGNOSIS — I5022 Chronic systolic (congestive) heart failure: Secondary | ICD-10-CM | POA: Diagnosis not present

## 2024-03-16 DIAGNOSIS — I4821 Permanent atrial fibrillation: Secondary | ICD-10-CM | POA: Diagnosis not present

## 2024-03-16 DIAGNOSIS — J440 Chronic obstructive pulmonary disease with acute lower respiratory infection: Secondary | ICD-10-CM | POA: Diagnosis not present

## 2024-03-23 DIAGNOSIS — I5033 Acute on chronic diastolic (congestive) heart failure: Secondary | ICD-10-CM | POA: Diagnosis not present

## 2024-03-23 DIAGNOSIS — N184 Chronic kidney disease, stage 4 (severe): Secondary | ICD-10-CM | POA: Diagnosis not present

## 2024-03-23 DIAGNOSIS — I5022 Chronic systolic (congestive) heart failure: Secondary | ICD-10-CM | POA: Diagnosis not present

## 2024-03-23 DIAGNOSIS — K219 Gastro-esophageal reflux disease without esophagitis: Secondary | ICD-10-CM | POA: Diagnosis not present

## 2024-03-23 DIAGNOSIS — I4821 Permanent atrial fibrillation: Secondary | ICD-10-CM | POA: Diagnosis not present

## 2024-03-23 DIAGNOSIS — I130 Hypertensive heart and chronic kidney disease with heart failure and stage 1 through stage 4 chronic kidney disease, or unspecified chronic kidney disease: Secondary | ICD-10-CM | POA: Diagnosis not present

## 2024-03-23 DIAGNOSIS — J440 Chronic obstructive pulmonary disease with acute lower respiratory infection: Secondary | ICD-10-CM | POA: Diagnosis not present

## 2024-03-23 DIAGNOSIS — D631 Anemia in chronic kidney disease: Secondary | ICD-10-CM | POA: Diagnosis not present

## 2024-03-23 DIAGNOSIS — F331 Major depressive disorder, recurrent, moderate: Secondary | ICD-10-CM | POA: Diagnosis not present

## 2024-03-29 DIAGNOSIS — I130 Hypertensive heart and chronic kidney disease with heart failure and stage 1 through stage 4 chronic kidney disease, or unspecified chronic kidney disease: Secondary | ICD-10-CM | POA: Diagnosis not present

## 2024-03-29 DIAGNOSIS — I5022 Chronic systolic (congestive) heart failure: Secondary | ICD-10-CM | POA: Diagnosis not present

## 2024-03-29 DIAGNOSIS — F331 Major depressive disorder, recurrent, moderate: Secondary | ICD-10-CM | POA: Diagnosis not present

## 2024-03-29 DIAGNOSIS — I4821 Permanent atrial fibrillation: Secondary | ICD-10-CM | POA: Diagnosis not present

## 2024-03-29 DIAGNOSIS — N184 Chronic kidney disease, stage 4 (severe): Secondary | ICD-10-CM | POA: Diagnosis not present

## 2024-03-29 DIAGNOSIS — J440 Chronic obstructive pulmonary disease with acute lower respiratory infection: Secondary | ICD-10-CM | POA: Diagnosis not present

## 2024-03-29 DIAGNOSIS — I5033 Acute on chronic diastolic (congestive) heart failure: Secondary | ICD-10-CM | POA: Diagnosis not present

## 2024-03-29 DIAGNOSIS — D631 Anemia in chronic kidney disease: Secondary | ICD-10-CM | POA: Diagnosis not present

## 2024-03-29 DIAGNOSIS — K219 Gastro-esophageal reflux disease without esophagitis: Secondary | ICD-10-CM | POA: Diagnosis not present

## 2024-04-03 DIAGNOSIS — I509 Heart failure, unspecified: Secondary | ICD-10-CM | POA: Diagnosis not present

## 2024-04-03 DIAGNOSIS — D649 Anemia, unspecified: Secondary | ICD-10-CM | POA: Diagnosis not present

## 2024-04-03 DIAGNOSIS — J449 Chronic obstructive pulmonary disease, unspecified: Secondary | ICD-10-CM | POA: Diagnosis not present

## 2024-04-03 DIAGNOSIS — I4891 Unspecified atrial fibrillation: Secondary | ICD-10-CM | POA: Diagnosis not present

## 2024-04-06 DIAGNOSIS — I4821 Permanent atrial fibrillation: Secondary | ICD-10-CM | POA: Diagnosis not present

## 2024-04-06 DIAGNOSIS — I5033 Acute on chronic diastolic (congestive) heart failure: Secondary | ICD-10-CM | POA: Diagnosis not present

## 2024-04-06 DIAGNOSIS — D631 Anemia in chronic kidney disease: Secondary | ICD-10-CM | POA: Diagnosis not present

## 2024-04-06 DIAGNOSIS — N184 Chronic kidney disease, stage 4 (severe): Secondary | ICD-10-CM | POA: Diagnosis not present

## 2024-04-06 DIAGNOSIS — I130 Hypertensive heart and chronic kidney disease with heart failure and stage 1 through stage 4 chronic kidney disease, or unspecified chronic kidney disease: Secondary | ICD-10-CM | POA: Diagnosis not present

## 2024-04-06 DIAGNOSIS — F331 Major depressive disorder, recurrent, moderate: Secondary | ICD-10-CM | POA: Diagnosis not present

## 2024-04-06 DIAGNOSIS — J440 Chronic obstructive pulmonary disease with acute lower respiratory infection: Secondary | ICD-10-CM | POA: Diagnosis not present

## 2024-04-06 DIAGNOSIS — I5022 Chronic systolic (congestive) heart failure: Secondary | ICD-10-CM | POA: Diagnosis not present

## 2024-04-06 DIAGNOSIS — K219 Gastro-esophageal reflux disease without esophagitis: Secondary | ICD-10-CM | POA: Diagnosis not present

## 2024-04-18 DIAGNOSIS — F331 Major depressive disorder, recurrent, moderate: Secondary | ICD-10-CM | POA: Diagnosis not present

## 2024-04-21 ENCOUNTER — Other Ambulatory Visit: Payer: Self-pay | Admitting: Cardiology

## 2024-05-02 DIAGNOSIS — D649 Anemia, unspecified: Secondary | ICD-10-CM | POA: Diagnosis not present

## 2024-05-02 DIAGNOSIS — D472 Monoclonal gammopathy: Secondary | ICD-10-CM | POA: Diagnosis not present

## 2024-05-02 DIAGNOSIS — I4891 Unspecified atrial fibrillation: Secondary | ICD-10-CM | POA: Diagnosis not present

## 2024-05-02 DIAGNOSIS — J849 Interstitial pulmonary disease, unspecified: Secondary | ICD-10-CM | POA: Diagnosis not present

## 2024-05-02 DIAGNOSIS — J449 Chronic obstructive pulmonary disease, unspecified: Secondary | ICD-10-CM | POA: Diagnosis not present

## 2024-05-02 DIAGNOSIS — N184 Chronic kidney disease, stage 4 (severe): Secondary | ICD-10-CM | POA: Diagnosis not present

## 2024-05-02 DIAGNOSIS — J9611 Chronic respiratory failure with hypoxia: Secondary | ICD-10-CM | POA: Diagnosis not present

## 2024-05-02 DIAGNOSIS — I509 Heart failure, unspecified: Secondary | ICD-10-CM | POA: Diagnosis not present

## 2024-05-02 DIAGNOSIS — E559 Vitamin D deficiency, unspecified: Secondary | ICD-10-CM | POA: Diagnosis not present

## 2024-05-04 ENCOUNTER — Telehealth: Payer: Self-pay | Admitting: Cardiology

## 2024-05-04 NOTE — Telephone Encounter (Signed)
 Pts daughter calling to f/u on appt for her mother. F/u was 1-2 weeks as per her last AVS. Next available is not until 11/17. Daughter Amber Hull would like to discuss palliative care, and ask if Amber Hull could talk about it with her mother, thinks she would be more receptive. Please advise

## 2024-05-06 NOTE — Telephone Encounter (Signed)
 Per note from Almarie Crate, NP  The referral to [facility] would need to come from PCP as I cannot see her. She also lives in [place] so I cannot do a telephone visit. Keep follow-up with me on 11/17 and in the meantime I read last ED note and she was instructed to follow-up with PCP. Thanks!

## 2024-05-18 ENCOUNTER — Other Ambulatory Visit: Payer: Self-pay | Admitting: Cardiology

## 2024-06-30 ENCOUNTER — Ambulatory Visit: Admitting: Nurse Practitioner

## 2024-07-14 DEATH — deceased
